# Patient Record
Sex: Female | Born: 1945 | Race: White | Hispanic: No | Marital: Married | Smoking: Former smoker
Health system: Southern US, Community
[De-identification: ages and names within clinical notes are randomized; demographics above are authoritative.]

## PROBLEM LIST (undated history)

## (undated) DIAGNOSIS — N6019 Diffuse cystic mastopathy of unspecified breast: Secondary | ICD-10-CM

## (undated) DIAGNOSIS — H3320 Serous retinal detachment, unspecified eye: Secondary | ICD-10-CM

## (undated) DIAGNOSIS — M1990 Unspecified osteoarthritis, unspecified site: Secondary | ICD-10-CM

## (undated) DIAGNOSIS — E538 Deficiency of other specified B group vitamins: Secondary | ICD-10-CM

## (undated) DIAGNOSIS — M797 Fibromyalgia: Secondary | ICD-10-CM

## (undated) DIAGNOSIS — Z8619 Personal history of other infectious and parasitic diseases: Secondary | ICD-10-CM

## (undated) DIAGNOSIS — J449 Chronic obstructive pulmonary disease, unspecified: Secondary | ICD-10-CM

## (undated) DIAGNOSIS — E785 Hyperlipidemia, unspecified: Secondary | ICD-10-CM

## (undated) DIAGNOSIS — G629 Polyneuropathy, unspecified: Secondary | ICD-10-CM

## (undated) DIAGNOSIS — M810 Age-related osteoporosis without current pathological fracture: Secondary | ICD-10-CM

## (undated) DIAGNOSIS — Z87891 Personal history of nicotine dependence: Secondary | ICD-10-CM

## (undated) DIAGNOSIS — Z8659 Personal history of other mental and behavioral disorders: Secondary | ICD-10-CM

## (undated) DIAGNOSIS — K589 Irritable bowel syndrome without diarrhea: Secondary | ICD-10-CM

## (undated) HISTORY — DX: Personal history of nicotine dependence: Z87.891

## (undated) HISTORY — DX: Personal history of other infectious and parasitic diseases: Z86.19

## (undated) HISTORY — DX: Irritable bowel syndrome, unspecified: K58.9

## (undated) HISTORY — DX: Deficiency of other specified B group vitamins: E53.8

## (undated) HISTORY — DX: Unspecified osteoarthritis, unspecified site: M19.90

## (undated) HISTORY — DX: Serous retinal detachment, unspecified eye: H33.20

## (undated) HISTORY — DX: Hyperlipidemia, unspecified: E78.5

## (undated) HISTORY — DX: Polyneuropathy, unspecified: G62.9

## (undated) HISTORY — DX: Diffuse cystic mastopathy of unspecified breast: N60.19

## (undated) HISTORY — DX: Fibromyalgia: M79.7

## (undated) HISTORY — DX: Personal history of other mental and behavioral disorders: Z86.59

## (undated) HISTORY — DX: Age-related osteoporosis without current pathological fracture: M81.0

---

## 1973-01-20 HISTORY — PX: PARTIAL HYSTERECTOMY: SHX80

## 2004-06-06 ENCOUNTER — Emergency Department (HOSPITAL_COMMUNITY): Admission: EM | Admit: 2004-06-06 | Discharge: 2004-06-07 | Payer: Self-pay | Admitting: Emergency Medicine

## 2005-12-10 ENCOUNTER — Encounter: Admission: RE | Admit: 2005-12-10 | Discharge: 2005-12-10 | Payer: Self-pay | Admitting: Gastroenterology

## 2005-12-20 HISTORY — PX: COLONOSCOPY: SHX174

## 2012-01-21 HISTORY — PX: RETINAL DETACHMENT SURGERY: SHX105

## 2012-01-21 HISTORY — PX: CATARACT EXTRACTION: SUR2

## 2013-01-20 DIAGNOSIS — M810 Age-related osteoporosis without current pathological fracture: Secondary | ICD-10-CM

## 2013-01-20 HISTORY — PX: OTHER SURGICAL HISTORY: SHX169

## 2013-01-20 HISTORY — DX: Age-related osteoporosis without current pathological fracture: M81.0

## 2013-02-07 LAB — HM DEXA SCAN

## 2013-02-08 ENCOUNTER — Ambulatory Visit: Payer: Self-pay | Admitting: Family Medicine

## 2013-02-08 LAB — HM MAMMOGRAPHY

## 2014-03-23 LAB — CBC
HGB: 14.5 g/dL
WBC: 7.9
platelet count: 195

## 2014-03-28 LAB — COMPLETE METABOLIC PANEL WITH GFR
ALT: 25
AST: 21 U/L
BILIRUBIN TOTAL: 0.6 mg/dL
Creat: 0.9
Glucose: 81
Potassium: 5 mmol/L

## 2014-11-14 ENCOUNTER — Ambulatory Visit (INDEPENDENT_AMBULATORY_CARE_PROVIDER_SITE_OTHER): Payer: Federal, State, Local not specified - PPO | Admitting: Family Medicine

## 2014-11-14 ENCOUNTER — Encounter: Payer: Self-pay | Admitting: Family Medicine

## 2014-11-14 ENCOUNTER — Encounter (INDEPENDENT_AMBULATORY_CARE_PROVIDER_SITE_OTHER): Payer: Self-pay

## 2014-11-14 VITALS — BP 128/84 | HR 88 | Temp 98.4°F | Ht 64.5 in | Wt 152.5 lb

## 2014-11-14 DIAGNOSIS — M545 Low back pain, unspecified: Secondary | ICD-10-CM | POA: Insufficient documentation

## 2014-11-14 DIAGNOSIS — E785 Hyperlipidemia, unspecified: Secondary | ICD-10-CM | POA: Diagnosis not present

## 2014-11-14 DIAGNOSIS — R208 Other disturbances of skin sensation: Secondary | ICD-10-CM | POA: Diagnosis not present

## 2014-11-14 DIAGNOSIS — G629 Polyneuropathy, unspecified: Secondary | ICD-10-CM | POA: Insufficient documentation

## 2014-11-14 DIAGNOSIS — K589 Irritable bowel syndrome without diarrhea: Secondary | ICD-10-CM

## 2014-11-14 MED ORDER — TRAMADOL HCL 50 MG PO TABS: 25.0000 mg | ORAL_TABLET | Freq: Three times a day (TID) | ORAL | Status: DC | PRN

## 2014-11-14 MED ORDER — SIMVASTATIN 40 MG PO TABS: 40.0000 mg | ORAL_TABLET | Freq: Every day | ORAL | Status: DC

## 2014-11-14 NOTE — Patient Instructions (Addendum)
We will request records of nerve tests and xrays recently.  Return as needed or in 2-3 months for medicare wellness visit Nice to meet you today, zocor (simvastatin) refilled today. Trial tramadol for back pain - start at 1/2 tablet.

## 2014-11-14 NOTE — Assessment & Plan Note (Signed)
Controlled with OTC remedies including probiotic

## 2014-11-14 NOTE — Assessment & Plan Note (Addendum)
Have requested records - will review when they arrive. Pt states had normal NCS previously. Continue gabapentin for now.

## 2014-11-14 NOTE — Assessment & Plan Note (Signed)
Chronic, stable. zocor refilled today.

## 2014-11-14 NOTE — Progress Notes (Signed)
BP 128/84 mmHg  Pulse 88  Temp(Src) 98.4 F (36.9 C) (Oral)  Ht 5' 4.5" (1.638 m)  Wt 152 lb 8 oz (69.174 kg)  BMI 25.78 kg/m2   CC: new pt to establish  Subjective:    Patient ID: Cassie Shaw, female    DOB: [birth year], 69 y.o.   MRN: [id number]  HPI: Cassie Shaw is a 69 y.o. female presenting on 11/14/2014 for Establish Care   Wife of Tabby Beaston. Prior saw Dr Lovie Macadamia at [facility].   Burning pain in feet - tested negative for peripheral neuropathy. Gabapentin not helpful. No redness/warmth. Never calf pain. Only affects sole of feet. No rash.   Chronic back pain - longstanding over last 2 yrs. 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 Takes aleve for this. Denies inciting trauma/injury. No shooting pain down legs, numbness or weakness of legs, bowel/bladder incontinence, fevers.   HLD - compliant with simvastatin, no myalgias.  Preventative: Last CPE unsure.  Lives with husband [name]), 2 dogs and a bird Occupation: retired, was Armed forces logistics/support/administrative officer Activity: no regular exercise Diet: good water, fruits/vegetables daily  Relevant past medical, surgical, family and social history reviewed and updated as indicated. Interim medical history since our last visit reviewed. Allergies and medications reviewed and updated. No current outpatient prescriptions on file prior to visit.   No current facility-administered medications on file prior to visit.    Review of Systems Per HPI unless specifically indicated in ROS section     Objective:    BP 128/84 mmHg  Pulse 88  Temp(Src) 98.4 F (36.9 C) (Oral)  Ht 5' 4.5" (1.638 m)  Wt 152 lb 8 oz (69.174 kg)  BMI 25.78 kg/m2  Wt Readings from Last 3 Encounters:  11/14/14 152 lb 8 oz (69.174 kg)    Physical Exam  Constitutional: She appears well-developed and well-nourished. No distress.  HENT:  Head: Normocephalic and atraumatic.  Mouth/Throat: Oropharynx is clear and moist. No oropharyngeal exudate.  Eyes: Conjunctivae and EOM  are normal. Pupils are equal, round, and reactive to light.  Neck: Normal range of motion. Neck supple.  Cardiovascular: Normal rate, regular rhythm, normal heart sounds and intact distal pulses.   No murmur heard. Pulmonary/Chest: Effort normal and breath sounds normal. No respiratory distress. She has no wheezes. She has no rales.  Musculoskeletal: She exhibits no edema.  No pain midline spine No paraspinous mm tenderness Neg SLR bilaterally. No pain with int/ext rotation at hip. Neg FABER. Feet neurovascularly intact, sensation to light touch and monofilament intact, 2+ DP bilaterally.   Neurological: She is alert.  Skin: Skin is warm and dry. No rash noted.  Psychiatric: She has a normal mood and affect.  Nursing note and vitals reviewed.  No results found for this or any previous visit.    Assessment & Plan:   Problem List Items Addressed This Visit    Lower back pain - Primary    Pt states told had arthritis - will review recent hip and lumbar xray obtained at [facility]. Records requested today. In interim, continue aleve, trial tramadol prn pain.       Relevant Medications   naproxen sodium (ANAPROX) 220 MG tablet   traMADol (ULTRAM) 50 MG tablet   IBS (irritable bowel syndrome)    Controlled with OTC remedies including probiotic      Relevant Medications   Probiotic Product (PROBIOTIC DAILY PO)   HLD (hyperlipidemia)    Chronic, stable. zocor refilled today.  Relevant Medications   simvastatin (ZOCOR) 40 MG tablet   Burning sensation of feet    Have requested records - will review when they arrive. Pt states had normal NCS previously. Continue gabapentin for now.          Follow up plan: Return in about 3 months (around 02/14/2015), or as needed, for medicare wellness visit.

## 2014-11-14 NOTE — Progress Notes (Signed)
Pre visit review using our clinic review tool, if applicable. No additional management support is needed unless otherwise documented below in the visit note. 

## 2014-11-14 NOTE — Assessment & Plan Note (Signed)
Pt states told had arthritis - will review recent hip and lumbar xray obtained at [facility]. Records requested today. In interim, continue aleve, trial tramadol prn pain.

## 2014-11-19 ENCOUNTER — Encounter: Payer: Self-pay | Admitting: Family Medicine

## 2014-11-19 DIAGNOSIS — N6019 Diffuse cystic mastopathy of unspecified breast: Secondary | ICD-10-CM | POA: Insufficient documentation

## 2014-11-19 DIAGNOSIS — M797 Fibromyalgia: Secondary | ICD-10-CM | POA: Insufficient documentation

## 2014-11-19 DIAGNOSIS — M810 Age-related osteoporosis without current pathological fracture: Secondary | ICD-10-CM | POA: Insufficient documentation

## 2014-11-19 DIAGNOSIS — E538 Deficiency of other specified B group vitamins: Secondary | ICD-10-CM | POA: Insufficient documentation

## 2014-11-24 ENCOUNTER — Encounter: Payer: Self-pay | Admitting: *Deleted

## 2014-12-02 ENCOUNTER — Encounter: Payer: Self-pay | Admitting: Family Medicine

## 2014-12-12 ENCOUNTER — Other Ambulatory Visit: Payer: Self-pay | Admitting: *Deleted

## 2014-12-12 MED ORDER — GABAPENTIN 600 MG PO TABS: 600.0000 mg | ORAL_TABLET | Freq: Two times a day (BID) | ORAL | Status: DC

## 2015-01-18 ENCOUNTER — Ambulatory Visit (INDEPENDENT_AMBULATORY_CARE_PROVIDER_SITE_OTHER): Payer: Federal, State, Local not specified - PPO | Admitting: Family Medicine

## 2015-01-18 ENCOUNTER — Encounter: Payer: Self-pay | Admitting: Family Medicine

## 2015-01-18 VITALS — BP 114/76 | HR 92 | Temp 98.7°F | Ht 64.5 in | Wt 151.5 lb

## 2015-01-18 DIAGNOSIS — J069 Acute upper respiratory infection, unspecified: Secondary | ICD-10-CM

## 2015-01-18 MED ORDER — DOXYCYCLINE HYCLATE 100 MG PO TABS: 100.0000 mg | ORAL_TABLET | Freq: Two times a day (BID) | ORAL | Status: DC

## 2015-01-18 NOTE — Progress Notes (Signed)
Dr. Frederico Hamman T. Bowe Sidor, MD, [facility] [place], [address] Phone: [phone] Fax: [phone]  01/18/2015  Patient: Cassie Shaw, MRN: [id number], DOB: Oct 28, 1945, 69 y.o.  Primary Physician:  Ria Bush, MD   Chief Complaint  Patient presents with  . Sinusitis    x 3 days  . Cough    with chest tightness   Subjective:   This 69 y.o. female patient presents with runny nose, sneezing, cough, sore throat, malaise and minimal / low-grade fever .  Pounding headache and will not loosen up.  Not feeling well since Monday   Quit smoking about 10 years. Smoked about 50 years.   + recent exposure to others with similar symptoms.   The patent denies sore throat as the primary complaint. Denies sthortness of breath/wheezing, high fever, chest pain, rhinits for more than 14 days, significant myalgia, otalgia, facial pain, abdominal pain, changes in bowel or bladder.  PMH, PHS, Allergies, Problem List, Medications, Family History, and Social History have all been reviewed.  Patient Active Problem List   Diagnosis Date Noted  . Osteoporosis   . Fibromyalgia   . Fibrocystic breast disease   . B12 deficiency   . Peripheral neuropathy ([place]) 11/14/2014  . IBS (irritable bowel syndrome)   . HLD (hyperlipidemia)   . Lower back pain     Past Medical History  Diagnosis Date  . History of chicken pox   . Arthritis   . History of depression     and anxiety  . HLD (hyperlipidemia)   . Ex-smoker   . Retinal detachment   . IBS (irritable bowel syndrome)   . Osteoporosis 2015    on/off fosamax DEXA T -3.5 spine, -2.5 hip  . Peripheral neuropathy ([place])   . Fibromyalgia   . Fibrocystic breast disease   . B12 deficiency     Past Surgical History  Procedure Laterality Date  . Partial hysterectomy  1975    for heavy bleeding, ovaries remained  . Cataract extraction Bilateral 2014    with lens implant  .  Retinal detachment surgery  2014    Dr Zadie Rhine  . Dexa  01/2013    T -3.5 spine, -2.5 hip    Social History   Social History  . Marital Status: Married    Spouse Name: N/A  . Number of Children: N/A  . Years of Education: N/A   Occupational History  . Not on file.   Social History Main Topics  . Smoking status: Former Smoker    Quit date: 01/21/2004  . Smokeless tobacco: Never Used  . Alcohol Use: No  . Drug Use: No  . Sexual Activity: Not on file   Other Topics Concern  . Not on file   Social History Narrative   Lives with husband [name]), 2 dogs and a bird   Occupation: retired, was Armed forces logistics/support/administrative officer   Activity: no regular exercise   Diet: good water, fruits/vegetables daily    Family History  Problem Relation Age of Onset  . Arthritis Mother   . Cancer Mother     smoker  . Cancer Sister     bone  . Stroke Neg Hx   . CAD Neg Hx   . Diabetes Maternal Uncle     No Known Allergies  Medication list reviewed and updated in full in [place].  ROS as above, eating and drinking - tolerating PO. Urinating normally. No excessive  vomitting or diarrhea. O/w as above.  Objective:   Blood pressure 114/76, pulse 92, temperature 98.7 F (37.1 C), temperature source Oral, height 5' 4.5" (1.638 m), weight 151 lb 8 oz (68.72 kg), SpO2 96 %.  GEN: WDWN, Non-toxic, Atraumatic, normocephalic. A and O x 3. HEENT: Oropharynx clear without exudate, MMM, no significant LAD, mild rhinnorhea Ears: TM clear, COL visualized with good landmarks CV: RRR, no m/g/r. Pulm: CTA B, no wheezes, rhonchi, or crackles, normal respiratory effort. EXT: no c/c/e Psych: well oriented, neither depressed nor anxious in appearance  Objective Data:  Assessment and Plan:   URI (upper respiratory infection)  Supportive care reviewed with patient. See patient instruction section.  Some sinus pain. Hold abx for now - but in 69 yo 50 pack year history, hold in case worse over new years.    Follow-up: No Follow-up on file.  New Prescriptions   DOXYCYCLINE (VIBRA-TABS) 100 MG TABLET    Take 1 tablet (100 mg total) by mouth 2 (two) times daily.   Modified Medications   No medications on file   No orders of the defined types were placed in this encounter.    Signed,  Maud Deed. Renley Banwart, MD   Patient's Medications  New Prescriptions   DOXYCYCLINE (VIBRA-TABS) 100 MG TABLET    Take 1 tablet (100 mg total) by mouth 2 (two) times daily.  Previous Medications   ALENDRONATE (FOSAMAX) 70 MG TABLET    Take 70 mg by mouth once a week. Take with a full glass of water on an empty stomach.   BIOTIN PO    Take 1 tablet by mouth daily.   CALCIUM CARB-CHOLECALCIFEROL (CALCIUM-VITAMIN D3) 600-400 MG-UNIT CAPS    Take 1 capsule by mouth daily.   GABAPENTIN (NEURONTIN) 600 MG TABLET    Take 1 tablet (600 mg total) by mouth 2 (two) times daily.   MULTIPLE VITAMIN (MULTIVITAMIN) TABLET    Take 1 tablet by mouth daily.   NAPROXEN SODIUM (ANAPROX) 220 MG TABLET    Take 220 mg by mouth 2 (two) times daily with a meal.   OMEGA-3 FATTY ACIDS (FISH OIL PO)    Take 1,000 mg by mouth daily.   PROBIOTIC PRODUCT (PROBIOTIC DAILY PO)    Take 1 capsule by mouth daily.   SIMVASTATIN (ZOCOR) 40 MG TABLET    Take 1 tablet (40 mg total) by mouth daily at 6 PM.   TRAMADOL (ULTRAM) 50 MG TABLET    Take 0.5-1 tablets (25-50 mg total) by mouth every 8 (eight) hours as needed.  Modified Medications   No medications on file  Discontinued Medications   No medications on file

## 2015-01-18 NOTE — Progress Notes (Signed)
Pre visit review using our clinic review tool, if applicable. No additional management support is needed unless otherwise documented below in the visit note. 

## 2015-09-07 ENCOUNTER — Encounter: Payer: Self-pay | Admitting: Family Medicine

## 2015-09-07 ENCOUNTER — Ambulatory Visit (INDEPENDENT_AMBULATORY_CARE_PROVIDER_SITE_OTHER): Payer: Federal, State, Local not specified - PPO | Admitting: Family Medicine

## 2015-09-07 VITALS — BP 122/82 | HR 80 | Temp 97.8°F | Wt 144.5 lb

## 2015-09-07 DIAGNOSIS — R110 Nausea: Secondary | ICD-10-CM | POA: Insufficient documentation

## 2015-09-07 DIAGNOSIS — G6289 Other specified polyneuropathies: Secondary | ICD-10-CM | POA: Diagnosis not present

## 2015-09-07 DIAGNOSIS — R112 Nausea with vomiting, unspecified: Secondary | ICD-10-CM | POA: Diagnosis not present

## 2015-09-07 LAB — COMPREHENSIVE METABOLIC PANEL
ALT: 24 U/L (ref 0–35)
AST: 26 U/L (ref 0–37)
Albumin: 4.6 g/dL (ref 3.5–5.2)
Alkaline Phosphatase: 24 U/L — ABNORMAL LOW (ref 39–117)
BILIRUBIN TOTAL: 0.5 mg/dL (ref 0.2–1.2)
BUN: 13 mg/dL (ref 6–23)
CHLORIDE: 104 meq/L (ref 96–112)
CO2: 30 meq/L (ref 19–32)
CREATININE: 0.85 mg/dL (ref 0.40–1.20)
Calcium: 9.8 mg/dL (ref 8.4–10.5)
GFR: 70.03 mL/min (ref >60.00)
GLUCOSE: 92 mg/dL (ref 70–99)
Potassium: 4.6 mEq/L (ref 3.5–5.1)
SODIUM: 141 meq/L (ref 135–145)
Total Protein: 7.8 g/dL (ref 6.0–8.3)

## 2015-09-07 LAB — CBC WITH DIFFERENTIAL/PLATELET
BASOS ABS: 0 10*3/uL (ref 0.0–0.1)
BASOS PCT: 0.4 % (ref 0.0–3.0)
EOS ABS: 0.1 10*3/uL (ref 0.0–0.7)
Eosinophils Relative: 1.3 % (ref 0.0–5.0)
HEMATOCRIT: 45 % (ref 36.0–46.0)
Hemoglobin: 15 g/dL (ref 12.0–15.0)
LYMPHS ABS: 2.4 10*3/uL (ref 0.7–4.0)
Lymphocytes Relative: 28.5 % (ref 12.0–46.0)
MCHC: 33.4 g/dL (ref 30.0–36.0)
MCV: 90.3 fl (ref 78.0–100.0)
Monocytes Absolute: 0.8 10*3/uL (ref 0.1–1.0)
Monocytes Relative: 9 % (ref 3.0–12.0)
NEUTROS ABS: 5.1 10*3/uL (ref 1.4–7.7)
NEUTROS PCT: 60.8 % (ref 43.0–77.0)
PLATELETS: 216 10*3/uL (ref 150.0–400.0)
RBC: 4.98 Mil/uL (ref 3.87–5.11)
RDW: 13.2 % (ref 11.5–15.5)
WBC: 8.4 10*3/uL (ref 4.0–10.5)

## 2015-09-07 LAB — FOLATE

## 2015-09-07 LAB — LIPASE: Lipase: 25 U/L (ref 11.0–59.0)

## 2015-09-07 LAB — TSH: TSH: 2 u[IU]/mL (ref 0.35–4.50)

## 2015-09-07 LAB — VITAMIN B12: Vitamin B-12: 533 pg/mL (ref 211–911)

## 2015-09-07 NOTE — Progress Notes (Signed)
BP 122/82   Pulse 80   Wt 144 lb 8 oz (65.5 kg)   BMI 24.42 kg/m   See chart for orthostatic vital signs CC: nausea, dizziness Subjective:    Patient ID: Cassie Shaw, female    DOB: [birth year], 70 y.o.   MRN: [id number]  HPI: Cassie Shaw is a 70 y.o. female presenting on 09/07/2015 for Dizziness (nausea)   1 wk h/o nausea/dizziness along with nausea. Vomited twice on Monday (NBNB). 7lb weight loss over last 8 months. She has been eating lots of sweets recently.   No new foods, recent travel.  Has been drinking ginger.  H/o IBS - may be more constipated. She treats this with probiotics, laxatives, etc.   Denies fevers/chills, abd pain, blood in stool, urinary symptoms like dysuria. No polyuria, polydipsia, polyphagia.   Longstanding neuropathy s/p NCS remotely - per patient normal. She treats this with gabapentin '600mg'$  BID and aleve BID. Ongoing trouble. Predominantly bothersome at night time.   Relevant past medical, surgical, family and social history reviewed and updated as indicated. Interim medical history since our last visit reviewed. Allergies and medications reviewed and updated. Current Outpatient Prescriptions on File Prior to Visit  Medication Sig  . alendronate (FOSAMAX) 70 MG tablet Take 70 mg by mouth once a week. Take with a full glass of water on an empty stomach.  Marland Kitchen BIOTIN PO Take 1 tablet by mouth daily.  . Calcium Carb-Cholecalciferol (CALCIUM-VITAMIN D3) 600-400 MG-UNIT CAPS Take 1 capsule by mouth daily.  Marland Kitchen gabapentin (NEURONTIN) 600 MG tablet Take 1 tablet (600 mg total) by mouth 2 (two) times daily.  . Multiple Vitamin (MULTIVITAMIN) tablet Take 1 tablet by mouth daily.  . naproxen sodium (ANAPROX) 220 MG tablet Take 220 mg by mouth 2 (two) times daily with a meal.  . Omega-3 Fatty Acids (FISH OIL PO) Take 1,000 mg by mouth daily.  . Probiotic Product (PROBIOTIC DAILY PO) Take 1 capsule by mouth daily.  . simvastatin (ZOCOR) 40 MG tablet Take 1 tablet  (40 mg total) by mouth daily at 6 PM.   No current facility-administered medications on file prior to visit.     Review of Systems Per HPI unless specifically indicated in ROS section     Objective:    BP 122/82   Pulse 80   Wt 144 lb 8 oz (65.5 kg)   BMI 24.42 kg/m   Wt Readings from Last 3 Encounters:  09/07/15 144 lb 8 oz (65.5 kg)  01/18/15 151 lb 8 oz (68.7 kg)  11/14/14 152 lb 8 oz (69.2 kg)    Physical Exam  Constitutional: She appears well-developed and well-nourished. No distress.  HENT:  Mouth/Throat: Oropharynx is clear and moist. No oropharyngeal exudate.  Cardiovascular: Normal rate, regular rhythm, normal heart sounds and intact distal pulses.   No murmur heard. Pulmonary/Chest: Effort normal and breath sounds normal. No respiratory distress. She has no wheezes. She has no rales.  Abdominal: Soft. Bowel sounds are normal. She exhibits no distension and no mass. There is no hepatosplenomegaly. There is no tenderness. There is no rigidity, no rebound, no guarding, no CVA tenderness and negative Murphy's sign.  Musculoskeletal: She exhibits no edema.  Skin: Skin is warm and dry. No rash noted.  Psychiatric: She has a normal mood and affect.  Nursing note and vitals reviewed.  Results for orders placed or performed in visit on 11/24/14  CBC  Result Value Ref Range   WBC 7.9  HGB 14.5 g/dL   platelet count 195   COMPLETE METABOLIC PANEL WITH GFR  Result Value Ref Range   Glucose 81    Potassium 5.0 mmol/L   Creat 0.9    AST 21 U/L   ALT 25    Total Bilirubin 0.6 mg/dL  Orthostatics positive.     Assessment & Plan:   Problem List Items Addressed This Visit    Nausea with vomiting - Primary    Anticipate viral gastritis. Discussed expected course of resolution. rec hold aleve until feeling better.  Supportive care with increased fluids. Check labs today (CMP, CBC, lipase).       Relevant Orders   Comprehensive metabolic panel   CBC with  Differential/Platelet   Lipase   Peripheral neuropathy ([place])    Ongoing trouble. Never received records. Check labs today (B12, TSH, folate, CBC, CMP). Continue gabapentin. Hold aleve until GI sxs have improved.       Relevant Orders   Comprehensive metabolic panel   CBC with Differential/Platelet   Vitamin B12   Folate   TSH    Other Visit Diagnoses   None.      Follow up plan: Return if symptoms worsen or fail to improve.  Ria Bush, MD

## 2015-09-07 NOTE — Assessment & Plan Note (Signed)
Ongoing trouble. Never received records. Check labs today (B12, TSH, folate, CBC, CMP). Continue gabapentin. Hold aleve until GI sxs have improved.

## 2015-09-07 NOTE — Patient Instructions (Addendum)
Vital signs show you may be a bit dehydrated. Continue drinking plenty of fluid.  Labs today.  We will be in touch with results.

## 2015-09-07 NOTE — Assessment & Plan Note (Signed)
Anticipate viral gastritis. Discussed expected course of resolution. rec hold aleve until feeling better.  Supportive care with increased fluids. Check labs today (CMP, CBC, lipase).

## 2015-09-07 NOTE — Progress Notes (Signed)
Pre visit review using our clinic review tool, if applicable. No additional management support is needed unless otherwise documented below in the visit note. 

## 2015-09-10 ENCOUNTER — Other Ambulatory Visit: Payer: Self-pay | Admitting: Family Medicine

## 2015-09-10 DIAGNOSIS — G6289 Other specified polyneuropathies: Secondary | ICD-10-CM

## 2015-09-12 NOTE — Addendum Note (Signed)
Addended by: Ria Bush on: 09/12/2015 09:08 AM   Modules accepted: Orders

## 2015-09-12 NOTE — Progress Notes (Signed)
Good Morning Dr [facility] would like you to put a Neurology Referral in and under comments put Nerve Conduction Study and then tell them what part of the body such as Bilateral Upper extremity, Left Upper extremity etc. Dr Posey Pronto would then review your notes to determine how much time is needed to do the test. Let me know if this is good with you. Thanks, Rosaria Ferries

## 2015-09-14 ENCOUNTER — Other Ambulatory Visit: Payer: Self-pay | Admitting: *Deleted

## 2015-09-14 DIAGNOSIS — G629 Polyneuropathy, unspecified: Secondary | ICD-10-CM

## 2015-09-18 ENCOUNTER — Ambulatory Visit (INDEPENDENT_AMBULATORY_CARE_PROVIDER_SITE_OTHER): Payer: Federal, State, Local not specified - PPO | Admitting: Neurology

## 2015-09-18 DIAGNOSIS — G629 Polyneuropathy, unspecified: Secondary | ICD-10-CM

## 2015-09-18 NOTE — Procedures (Signed)
[facility]  [address], [address]  [place], [place] [address] Tel: [phone] Fax:  [phone] Test Date:  09/18/2015  Patient: Cassie Shaw DOB: 1945-03-27 Physician: Narda Amber, DO  Sex: Female Height: '5\' 4"'$  Ref Phys: Philip Aspen, M.D.  ID#: [id number] Temp: 33.7C Technician: Jerilynn Mages. Dean   Patient Complaints: This is a 70 year old female referred for evaluation of bilateral burning pain over the soles of her feet.  NCV & EMG Findings: Extensive electrodiagnostic testing of the right lower extremity and additional studies of the left shows: 1. Bilateral sural and superficial peroneal sensory responses are within normal limits. 2. Bilateral peroneal and tibial motor responses are within normal limits. 3. Bilateral tibial H reflex studies are mildly prolonged, and of unclear clinical significance. 4. There is no evidence of active or chronic motor axon loss changes affecting any of the tested muscles. Motor unit configuration and recruitment pattern is within normal limits.  Impression: This is a normal study of the lower extremities.   In particular, there is no evidence of a large fiber sensorimotor polyneuropathy or lumbosacral radiculopathy. However, a small fiber neuropathy cannot be excluded by this study.   ___________________________ Narda Amber, DO    Nerve Conduction Studies Anti Sensory Summary Table   Stim Site NR Peak (ms) Norm Peak (ms) P-T Amp (V) Norm P-T Amp  Left Sup Peroneal Anti Sensory (Ant Lat Mall)  12 cm    3.5 <4.6 7.3 >3  Right Sup Peroneal Anti Sensory (Ant Lat Mall)  33.7C  12 cm    3.4 <4.6 7.1 >3  Left Sural Anti Sensory (Lat Mall)  Calf    4.4 <4.6 11.1 >3  Right Sural Anti Sensory (Lat Mall)  33.7C  Calf    4.3 <4.6 10.7 >3   Motor Summary Table   Stim Site NR Onset (ms) Norm Onset (ms) O-P Amp (mV) Norm O-P Amp Site1 Site2 Delta-0 (ms) Dist (cm) Vel (m/s) Norm Vel (m/s)  Left Peroneal Motor (Ext Dig Brev)  Ankle     3.9 <6.0 2.6 >2.5 B Fib Ankle 7.2 32.0 44 >40  B Fib    11.1  2.3  Poplt B Fib 2.3 10.0 43 >40  Poplt    13.4  2.4         Right Peroneal Motor (Ext Dig Brev)  33.7C  Ankle    3.9 <6.0 3.9 >2.5 B Fib Ankle 7.8 33.0 42 >40  B Fib    11.7  3.3  Poplt B Fib 2.2 10.0 45 >40  Poplt    13.9  3.2         Left Tibial Motor (Abd Hall Brev)  Ankle    4.1 <6.0 7.3 >4 Knee Ankle 8.7 37.0 43 >40  Knee    12.8  4.2         Right Tibial Motor (Abd Hall Brev)  33.7C  Ankle    3.8 <6.0 5.8 >4 Knee Ankle 9.6 40.0 42 >40  Knee    13.4  3.3          H Reflex Studies   NR H-Lat (ms) Lat Norm (ms) L-R H-Lat (ms)  Left Tibial (Gastroc)  33.7C     36.46 <35 0.95  Right Tibial (Gastroc)  33.7C     37.41 <35 0.95   EMG   Side Muscle Ins Act Fibs Psw Fasc Number Recrt Dur Dur. Amp Amp. Poly Poly. Comment  Right AntTibialis Nml Nml Nml Nml Nml Nml Nml  Nml Nml Nml Nml Nml N/A  Right Gastroc Nml Nml Nml Nml Nml Nml Nml Nml Nml Nml Nml Nml N/A  Right Flex Dig Long Nml Nml Nml Nml Nml Nml Nml Nml Nml Nml Nml Nml N/A  Right RectFemoris Nml Nml Nml Nml Nml Nml Nml Nml Nml Nml Nml Nml N/A  Right GluteusMed Nml Nml Nml Nml Nml Nml Nml Nml Nml Nml Nml Nml N/A  Right BicepsFemS Nml Nml Nml Nml Nml Nml Nml Nml Nml Nml Nml Nml N/A  Left AntTibialis Nml Nml Nml Nml Nml Nml Nml Nml Nml Nml Nml Nml N/A  Left Gastroc Nml Nml Nml Nml Nml Nml Nml Nml Nml Nml Nml Nml N/A  Left Flex Dig Long Nml Nml Nml Nml Nml Nml Nml Nml Nml Nml Nml Nml N/A  Left RectFemoris Nml Nml Nml Nml Nml Nml Nml Nml Nml Nml Nml Nml N/A      Waveforms:

## 2015-10-01 ENCOUNTER — Ambulatory Visit (INDEPENDENT_AMBULATORY_CARE_PROVIDER_SITE_OTHER): Payer: Federal, State, Local not specified - PPO | Admitting: Family Medicine

## 2015-10-01 DIAGNOSIS — Z23 Encounter for immunization: Secondary | ICD-10-CM

## 2015-10-01 NOTE — Progress Notes (Signed)
Spoke with patient re: normal NCS Offered options: 1. Monitor 2. Refer to Dr Posey Pronto 3. Refer to podiatry to consider biopsy.  Pt will monitor for now, reassess at next visit

## 2016-01-28 ENCOUNTER — Other Ambulatory Visit: Payer: Self-pay | Admitting: Family Medicine

## 2016-03-03 ENCOUNTER — Encounter: Payer: Self-pay | Admitting: Internal Medicine

## 2016-03-03 ENCOUNTER — Ambulatory Visit (INDEPENDENT_AMBULATORY_CARE_PROVIDER_SITE_OTHER): Payer: Federal, State, Local not specified - PPO | Admitting: Internal Medicine

## 2016-03-03 VITALS — BP 118/76 | HR 102 | Temp 100.1°F | Wt 143.5 lb

## 2016-03-03 DIAGNOSIS — J111 Influenza due to unidentified influenza virus with other respiratory manifestations: Secondary | ICD-10-CM | POA: Diagnosis not present

## 2016-03-03 MED ORDER — HYDROCODONE-HOMATROPINE 5-1.5 MG/5ML PO SYRP: 5.0000 mL | ORAL_SOLUTION | Freq: Three times a day (TID) | ORAL | 0 refills | Status: DC | PRN

## 2016-03-03 NOTE — Patient Instructions (Signed)

## 2016-03-03 NOTE — Progress Notes (Signed)
HPI  Pt presents to the clinic today with c/o runny nose, cough, chest congestion. This started 3-4 days ago. She is blowing clear mucous out of her nose. The cough is productive of clear/yellow mucous. She has run fevers up to 101, had chills, body aches and mild shortness of breath. She has tried Sudafed, Mucinex DM and Nasocort with minimal relief. She did get her flu shot. She has not had sick contacts that she was aware of.   Review of Systems        Past Medical History:  Diagnosis Date  . Arthritis   . B12 deficiency   . Ex-smoker   . Fibrocystic breast disease   . Fibromyalgia   . History of chicken pox   . History of depression    and anxiety  . HLD (hyperlipidemia)   . IBS (irritable bowel syndrome)   . Osteoporosis 2015   on/off fosamax DEXA T -3.5 spine, -2.5 hip  . Peripheral neuropathy ([place])   . Retinal detachment     Family History  Problem Relation Age of Onset  . Arthritis Mother   . Cancer Mother     smoker  . Cancer Sister     bone  . Stroke Neg Hx   . CAD Neg Hx   . Diabetes Maternal Uncle     Social History   Social History  . Marital status: Married    Spouse name: N/A  . Number of children: N/A  . Years of education: N/A   Occupational History  . Not on file.   Social History Main Topics  . Smoking status: Former Smoker    Quit date: 01/21/2004  . Smokeless tobacco: Never Used  . Alcohol use No  . Drug use: No  . Sexual activity: Not on file   Other Topics Concern  . Not on file   Social History Narrative   Lives with husband [name]), 2 dogs and a bird   Occupation: retired, was Armed forces logistics/support/administrative officer   Activity: no regular exercise   Diet: good water, fruits/vegetables daily    No Known Allergies   Constitutional: Positive headache, fatigue and fever. Denies abrupt weight changes.  HEENT:  Positive runny nose. Denies eye redness, eye pain, pressure behind the eyes, facial pain, nasal congestion, ear pain, ringing in the ears, wax  buildup, or sore throat. Respiratory: Positive cough and shortness of breath. Denies difficulty breathing.  Cardiovascular: Denies chest pain, chest tightness, palpitations or swelling in the hands or feet.   No other specific complaints in a complete review of systems (except as listed in HPI above).  Objective:   BP 118/76   Pulse (!) 102   Temp 100.1 F (37.8 C) (Oral)   Wt 143 lb 8 oz (65.1 kg)   SpO2 96%   BMI 24.25 kg/m  Wt Readings from Last 3 Encounters:  03/03/16 143 lb 8 oz (65.1 kg)  09/07/15 144 lb 8 oz (65.5 kg)  01/18/15 151 lb 8 oz (68.7 kg)     General: Appears her stated age, ill appearing in NAD. HEENT: Head: normal shape and size, no sinus tenderness noted; Ears: Tm's pink but intact, normal light reflex, + serous effusion bilaterally; Nose: mucosa boggy and moist, septum midline; Throat/Mouth: + PND. Teeth present, mucosa pink and moist, no exudate noted, no lesions or ulcerations noted.  Neck: No cervical lymphadenopathy.  Cardiovascular: Tachycardic with normal rhythm. S1,S2 noted.  No murmur, rubs or gallops noted.  Pulmonary/Chest: Normal effort and positive  vesicular breath sounds. No respiratory distress. No wheezes, rales or ronchi noted.       Assessment & Plan:   Influenza:  Too late for Tamiflu Get some rest and drink plenty of water Tylenol or Ibuprofen for fever and body aches Continue Nasocort and Mucinex Rx for Hycodan cough syrup  RTC as needed or if symptoms persist.   Webb Silversmith, NP

## 2016-03-13 ENCOUNTER — Ambulatory Visit (INDEPENDENT_AMBULATORY_CARE_PROVIDER_SITE_OTHER): Payer: Federal, State, Local not specified - PPO | Admitting: Family Medicine

## 2016-03-13 ENCOUNTER — Encounter: Payer: Self-pay | Admitting: Family Medicine

## 2016-03-13 VITALS — BP 124/84 | HR 82 | Temp 98.4°F | Wt 139.0 lb

## 2016-03-13 DIAGNOSIS — M810 Age-related osteoporosis without current pathological fracture: Secondary | ICD-10-CM

## 2016-03-13 DIAGNOSIS — J019 Acute sinusitis, unspecified: Secondary | ICD-10-CM

## 2016-03-13 DIAGNOSIS — J0100 Acute maxillary sinusitis, unspecified: Secondary | ICD-10-CM | POA: Insufficient documentation

## 2016-03-13 DIAGNOSIS — G6289 Other specified polyneuropathies: Secondary | ICD-10-CM | POA: Diagnosis not present

## 2016-03-13 DIAGNOSIS — R42 Dizziness and giddiness: Secondary | ICD-10-CM | POA: Insufficient documentation

## 2016-03-13 DIAGNOSIS — E785 Hyperlipidemia, unspecified: Secondary | ICD-10-CM

## 2016-03-13 LAB — LIPID PANEL
CHOL/HDL RATIO: 7
Cholesterol: 269 mg/dL — ABNORMAL HIGH (ref 0–200)
HDL: 41.4 mg/dL (ref >39.00)
NonHDL: 227.78
Triglycerides: 324 mg/dL — ABNORMAL HIGH (ref 0.0–149.0)
VLDL: 64.8 mg/dL — AB (ref 0.0–40.0)

## 2016-03-13 LAB — VITAMIN D 25 HYDROXY (VIT D DEFICIENCY, FRACTURES): VITD: 27.46 ng/mL — ABNORMAL LOW (ref 30.00–100.00)

## 2016-03-13 LAB — LDL CHOLESTEROL, DIRECT: LDL DIRECT: 161 mg/dL

## 2016-03-13 MED ORDER — AMOXICILLIN-POT CLAVULANATE 875-125 MG PO TABS: 1.0000 | ORAL_TABLET | Freq: Two times a day (BID) | ORAL | 0 refills | Status: AC

## 2016-03-13 NOTE — Assessment & Plan Note (Signed)
Pt self stopped zocor, fish oil. Worried worsening burning foot pain from this. Will update FLP off statin

## 2016-03-13 NOTE — Assessment & Plan Note (Signed)
Anticipate acute bacterial sinus infection given duration and progression of symptoms, after initial influenza illness. Treat with augmentin antibiotic. Push fluids and rest. Further supportive care as per instructions.

## 2016-03-13 NOTE — Patient Instructions (Signed)
Labs today I do recommend you restart weekly fosamax.  I think you had vestibular neuritis after infection leading to vertigo - this should be better.  I think you have sinusitis - treat with augmentin for 10 days. Push fluids and rest. May take ibuprofen as needed.

## 2016-03-13 NOTE — Assessment & Plan Note (Signed)
Sudden episode of vertigo 2d ago that lasted 1 hour, in setting of recent influenza and now concern for bacterial sinusitis. Anticipate vestibular neuritis. Reassured. Update if recurrent.

## 2016-03-13 NOTE — Assessment & Plan Note (Signed)
Off fosamax - worried this was causing worsening neuropathy. rec restart this. Check vit D level today.

## 2016-03-13 NOTE — Assessment & Plan Note (Signed)
Improved when she stopped other medications. Continues gabapentin '600mg'$  bid.

## 2016-03-13 NOTE — Progress Notes (Signed)
BP 124/84   Pulse 82   Temp 98.4 F (36.9 C) (Oral)   Wt 139 lb (63 kg)   SpO2 96%   BMI 23.49 kg/m    CC: URI Subjective:    Patient ID: Cassie Shaw, female    DOB: [birth year], 71 y.o.   MRN: [id number]  HPI: Cassie Shaw is a 71 y.o. female presenting on 03/13/2016 for URI (congestion,cough,HA,sinus pressure)   Sick for 3 weeks. Some bowel/bladder incontinence due to cough. 2d ago episode of vertigo while in bed. This lasted ~1 hour. Wanted to go to ER but didn't go. +nausea. No hearing changes. Vertigo is now better. Ongoing productive cough. Some dyspnea and PNdrainage and HA. Ongoing sinus pressure - with ongoing head and chest congestion.   Denies further fevers/chills, wheezing, ear or tooth pain, abd pain, ST.   Seen here 2/12 with dx influenza. Not prescribed tamiflu as too late. Flu swab not checked. Treated with hycodan which also helped.   Stopped all meds except for gabapentin - though meds were causing burning in feet. Requests FLP checked today as fasting.   Relevant past medical, surgical, family and social history reviewed and updated as indicated. Interim medical history since our last visit reviewed. Allergies and medications reviewed and updated. Outpatient Medications Prior to Visit  Medication Sig Dispense Refill  . gabapentin (NEURONTIN) 600 MG tablet TAKE 1 TABLET (600 MG TOTAL) BY MOUTH 2 (TWO) TIMES DAILY. 180 tablet 0  . alendronate (FOSAMAX) 70 MG tablet Take 70 mg by mouth once a week. Take with a full glass of water on an empty stomach.    Marland Kitchen BIOTIN PO Take 1 tablet by mouth daily.    . Calcium Carb-Cholecalciferol (CALCIUM-VITAMIN D3) 600-400 MG-UNIT CAPS Take 1 capsule by mouth daily.    Marland Kitchen HYDROcodone-homatropine (HYCODAN) 5-1.5 MG/5ML syrup Take 5 mLs by mouth every 8 (eight) hours as needed for cough. (Patient not taking: Reported on 03/13/2016) 75 mL 0  . Multiple Vitamin (MULTIVITAMIN) tablet Take 1 tablet by mouth daily.    . naproxen sodium  (ANAPROX) 220 MG tablet Take 220 mg by mouth 2 (two) times daily with a meal.    . Omega-3 Fatty Acids (FISH OIL PO) Take 1,000 mg by mouth daily.    . Probiotic Product (PROBIOTIC DAILY PO) Take 1 capsule by mouth daily.    . simvastatin (ZOCOR) 40 MG tablet Take 1 tablet (40 mg total) by mouth daily at 6 PM. (Patient not taking: Reported on 03/03/2016) 90 tablet 3   No facility-administered medications prior to visit.      Per HPI unless specifically indicated in ROS section below Review of Systems     Objective:    BP 124/84   Pulse 82   Temp 98.4 F (36.9 C) (Oral)   Wt 139 lb (63 kg)   SpO2 96%   BMI 23.49 kg/m   Wt Readings from Last 3 Encounters:  03/13/16 139 lb (63 kg)  03/03/16 143 lb 8 oz (65.1 kg)  09/07/15 144 lb 8 oz (65.5 kg)    Physical Exam  Constitutional: She appears well-developed and well-nourished. No distress.  HENT:  Head: Normocephalic and atraumatic.  Right Ear: Hearing, tympanic membrane, external ear and ear canal normal.  Left Ear: Hearing, tympanic membrane, external ear and ear canal normal.  Nose: No mucosal edema or rhinorrhea. Right sinus exhibits no maxillary sinus tenderness and no frontal sinus tenderness. Left sinus exhibits no maxillary sinus tenderness and  no frontal sinus tenderness.  Mouth/Throat: Uvula is midline, oropharynx is clear and moist and mucous membranes are normal. No oropharyngeal exudate, posterior oropharyngeal edema, posterior oropharyngeal erythema or tonsillar abscesses.  Mild nasal mucosal injection  Eyes: Conjunctivae and EOM are normal. Pupils are equal, round, and reactive to light. No scleral icterus.  Neck: Normal range of motion. Neck supple.  Cardiovascular: Normal rate, regular rhythm, normal heart sounds and intact distal pulses.   No murmur heard. Pulmonary/Chest: Effort normal and breath sounds normal. No respiratory distress. She has no wheezes. She has no rales.  Musculoskeletal: She exhibits no edema.    Lymphadenopathy:    She has no cervical adenopathy.  Skin: Skin is warm and dry. No rash noted.  Nursing note and vitals reviewed.  Results for orders placed or performed in visit on 09/07/15  Comprehensive metabolic panel  Result Value Ref Range   Sodium 141 135 - 145 mEq/L   Potassium 4.6 3.5 - 5.1 mEq/L   Chloride 104 96 - 112 mEq/L   CO2 30 19 - 32 mEq/L   Glucose, Bld 92 70 - 99 mg/dL   BUN 13 6 - 23 mg/dL   Creatinine, Ser 0.85 0.40 - 1.20 mg/dL   Total Bilirubin 0.5 0.2 - 1.2 mg/dL   Alkaline Phosphatase 24 (L) 39 - 117 U/L   AST 26 0 - 37 U/L   ALT 24 0 - 35 U/L   Total Protein 7.8 6.0 - 8.3 g/dL   Albumin 4.6 3.5 - 5.2 g/dL   Calcium 9.8 8.4 - 10.5 mg/dL   GFR 70.03 >60.00 mL/min  CBC with Differential/Platelet  Result Value Ref Range   WBC 8.4 4.0 - 10.5 K/uL   RBC 4.98 3.87 - 5.11 Mil/uL   Hemoglobin 15.0 12.0 - 15.0 g/dL   HCT 45.0 36.0 - 46.0 %   MCV 90.3 78.0 - 100.0 fl   MCHC 33.4 30.0 - 36.0 g/dL   RDW 13.2 11.5 - 15.5 %   Platelets 216.0 150.0 - 400.0 K/uL   Neutrophils Relative % 60.8 43.0 - 77.0 %   Lymphocytes Relative 28.5 12.0 - 46.0 %   Monocytes Relative 9.0 3.0 - 12.0 %   Eosinophils Relative 1.3 0.0 - 5.0 %   Basophils Relative 0.4 0.0 - 3.0 %   Neutro Abs 5.1 1.4 - 7.7 K/uL   Lymphs Abs 2.4 0.7 - 4.0 K/uL   Monocytes Absolute 0.8 0.1 - 1.0 K/uL   Eosinophils Absolute 0.1 0.0 - 0.7 K/uL   Basophils Absolute 0.0 0.0 - 0.1 K/uL  Lipase  Result Value Ref Range   Lipase 25.0 11.0 - 59.0 U/L  Vitamin B12  Result Value Ref Range   Vitamin B-12 533 211 - 911 pg/mL  Folate  Result Value Ref Range   Folate >23.7 >5.9 ng/mL  TSH  Result Value Ref Range   TSH 2.00 0.35 - 4.50 uIU/mL  No results found for: CHOL, HDL, LDLCALC, LDLDIRECT, TRIG, CHOLHDL     Assessment & Plan:   Problem List Items Addressed This Visit    Acute sinusitis - Primary    Anticipate acute bacterial sinus infection given duration and progression of symptoms, after  initial influenza illness. Treat with augmentin antibiotic. Push fluids and rest. Further supportive care as per instructions.       Relevant Medications   amoxicillin-clavulanate (AUGMENTIN) 875-125 MG tablet   HLD (hyperlipidemia)    Pt self stopped zocor, fish oil. Worried worsening burning foot pain from  this. Will update FLP off statin      Relevant Orders   Lipid panel   Osteoporosis    Off fosamax - worried this was causing worsening neuropathy. rec restart this. Check vit D level today.       Relevant Orders   VITAMIN D 25 Hydroxy (Vit-D Deficiency, Fractures)   Peripheral neuropathy ([place])    Improved when she stopped other medications. Continues gabapentin '600mg'$  bid.       Vertigo    Sudden episode of vertigo 2d ago that lasted 1 hour, in setting of recent influenza and now concern for bacterial sinusitis. Anticipate vestibular neuritis. Reassured. Update if recurrent.           Follow up plan: No Follow-up on file.  Ria Bush, MD

## 2016-03-13 NOTE — Progress Notes (Signed)
Pre visit review using our clinic review tool, if applicable. No additional management support is needed unless otherwise documented below in the visit note. 

## 2016-03-15 ENCOUNTER — Other Ambulatory Visit: Payer: Self-pay | Admitting: Family Medicine

## 2016-03-15 MED ORDER — VITAMIN D3 25 MCG (1000 UT) PO CAPS: 1.0000 | ORAL_CAPSULE | Freq: Every day | ORAL | Status: AC

## 2016-03-15 MED ORDER — ATORVASTATIN CALCIUM 40 MG PO TABS: 40.0000 mg | ORAL_TABLET | Freq: Every day | ORAL | 9 refills | Status: DC

## 2016-04-17 ENCOUNTER — Ambulatory Visit (INDEPENDENT_AMBULATORY_CARE_PROVIDER_SITE_OTHER): Payer: Federal, State, Local not specified - PPO | Admitting: Podiatry

## 2016-04-17 ENCOUNTER — Ambulatory Visit (INDEPENDENT_AMBULATORY_CARE_PROVIDER_SITE_OTHER): Payer: Federal, State, Local not specified - PPO

## 2016-04-17 ENCOUNTER — Encounter: Payer: Self-pay | Admitting: Podiatry

## 2016-04-17 VITALS — Resp 16 | Ht 65.0 in | Wt 142.0 lb

## 2016-04-17 DIAGNOSIS — G629 Polyneuropathy, unspecified: Secondary | ICD-10-CM

## 2016-04-17 DIAGNOSIS — M79671 Pain in right foot: Secondary | ICD-10-CM

## 2016-04-17 DIAGNOSIS — M79672 Pain in left foot: Principal | ICD-10-CM

## 2016-04-17 NOTE — Progress Notes (Signed)
   Subjective:    Patient ID: Cassie Shaw, female    DOB: 09-Dec-1945, 71 y.o.   MRN: [id number]  HPI  Chief Complaint  Patient presents with  . Foot Pain    BL; Bottom of feet. Pt stated "been having burning on the entire bottom of my feet for the past 10 years, has had neuopathy tests done in the past but has never been diagnosed"        Review of Systems     Objective:   Physical Exam        Assessment & Plan:

## 2016-04-18 ENCOUNTER — Telehealth: Payer: Self-pay | Admitting: *Deleted

## 2016-04-18 MED ORDER — NONFORMULARY OR COMPOUNDED ITEM: 2 refills | Status: DC

## 2016-04-18 NOTE — Telephone Encounter (Signed)
Dr. Paulla Dolly ordered [place] - Peripheral Neuropathy cream. Faxed to [facility].

## 2016-04-18 NOTE — Progress Notes (Signed)
Subjective:     Patient ID: Cassie Shaw, female   DOB: 1945/06/20, 71 y.o.   MRN: [id number]  HPI patient presents stating I get a lot of burning in both my feet and it's been going on a number of years and I'm on gabapentin which only seems to help me temporarily   Review of Systems  All other systems reviewed and are negative.      Objective:   Physical Exam  Constitutional: She is oriented to person, place, and time.  Cardiovascular: Intact distal pulses.   Musculoskeletal: Normal range of motion.  Neurological: She is oriented to person, place, and time.  Skin: Skin is warm and dry.  Nursing note and vitals reviewed.  neurovascular status was found to be intact with significant diminishment of sharp dull and vibratory sensation bilateral. 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 At this point I do think she shouldn't increase her gabapentin at night to try to see whether or not this will help with the burning pain she develops and utilized soaks not going barefoot and cushion. She'll be seen back as needed  X-rays did not indicate arthritis or advanced osteoporosis

## 2016-05-13 ENCOUNTER — Other Ambulatory Visit: Payer: Self-pay | Admitting: Family Medicine

## 2016-06-10 ENCOUNTER — Other Ambulatory Visit: Payer: Self-pay | Admitting: Family Medicine

## 2016-10-04 ENCOUNTER — Other Ambulatory Visit: Payer: Self-pay | Admitting: Family Medicine

## 2016-10-06 NOTE — Telephone Encounter (Signed)
Pt see podiatry for neuropathy. Has only been seen here for acute illness visits since 2016.

## 2016-10-06 NOTE — Telephone Encounter (Signed)
Will refill 1 63mo supply and ask her to come in for CPE or f/u visit

## 2016-10-14 NOTE — Telephone Encounter (Signed)
Spoke with pt, relayed message per Dr. Darnell Level. Says she will have to cb to schedule an appt.

## 2016-10-20 ENCOUNTER — Telehealth: Payer: Self-pay | Admitting: Family Medicine

## 2016-10-20 NOTE — Telephone Encounter (Signed)
Ok to do - just remind her it may have higher chance of side effects.

## 2016-10-20 NOTE — Telephone Encounter (Signed)
Caller Name:gordon Laube  Relationship to Patient:spouse Best [place]:  Reason for call: spouse is wanting wife to have high dose flu shot.  Is this ok?

## 2016-10-22 NOTE — Telephone Encounter (Signed)
Spoke with pt relaying message per Dr. Darnell Level. Says ok. And she will inform Mr. [name].

## 2016-11-03 ENCOUNTER — Encounter: Payer: Self-pay | Admitting: Family Medicine

## 2016-11-03 ENCOUNTER — Ambulatory Visit (INDEPENDENT_AMBULATORY_CARE_PROVIDER_SITE_OTHER): Payer: Federal, State, Local not specified - PPO | Admitting: Family Medicine

## 2016-11-03 VITALS — BP 118/84 | HR 90 | Temp 98.0°F | Wt 147.5 lb

## 2016-11-03 DIAGNOSIS — N3001 Acute cystitis with hematuria: Secondary | ICD-10-CM

## 2016-11-03 DIAGNOSIS — N390 Urinary tract infection, site not specified: Secondary | ICD-10-CM | POA: Insufficient documentation

## 2016-11-03 DIAGNOSIS — R350 Frequency of micturition: Secondary | ICD-10-CM | POA: Diagnosis not present

## 2016-11-03 LAB — POC URINALSYSI DIPSTICK (AUTOMATED)
BILIRUBIN UA: NEGATIVE
GLUCOSE UA: NEGATIVE
KETONES UA: NEGATIVE
Nitrite, UA: NEGATIVE
Protein, UA: NEGATIVE
SPEC GRAV UA: 1.015 (ref 1.010–1.025)
UROBILINOGEN UA: 0.2 U/dL
pH, UA: 6 (ref 5.0–8.0)

## 2016-11-03 MED ORDER — CEPHALEXIN 500 MG PO CAPS: 500.0000 mg | ORAL_CAPSULE | Freq: Two times a day (BID) | ORAL | 0 refills | Status: DC

## 2016-11-03 NOTE — Assessment & Plan Note (Addendum)
Anticipate acute UTI given UA and story. She does not have h/o recurrent UTIs - UCx not sent. Treat with 1 wk keflex course. Given 3+ blood on UA, I did ask patient to return in 2-3 wks to drop off rpt UA to ensure hematuria has cleared. Pt agrees with plan.

## 2016-11-03 NOTE — Patient Instructions (Addendum)
I do think you have a urine infection. Treat with 1 week course of keflex antibiotic twice daily. Push fluids and plenty of rest. May use cranberry juice to help prevent infection.  Let us know if fever >101 or not improving with treatment. Given microscopic blood in urine, I do recommend you return in 2-3 weeks for repeat urine test - may drop off specimen.

## 2016-11-03 NOTE — Progress Notes (Signed)
BP 118/84 (BP Location: Left Arm, Patient Position: Sitting, Cuff Size: Normal)   Pulse 90   Temp 98 F (36.7 C) (Oral)   Wt 147 lb 8 oz (66.9 kg)   SpO2 95%   BMI 24.55 kg/m    CC: UTI sxs Subjective:    Patient ID: Cassie Shaw, female    DOB: [birth year], 71 y.o.   MRN: [id number]  HPI: Cassie Shaw is a 71 y.o. female presenting on 11/03/2016 for Urinary Frequency (Started 10/27/16. Has tried Uristat, barely helpful) and Urinary Urgency   1+ wk h/o urinary urgency and frequency and suprapubic pressure, as well as incomplete emptying. uristat helped but didn't resolve symptoms. Some urethral pain/pressure with voiding. Some R flank pain. Some nausea. She has been drinking cranberry juice.   No fevers/chills, vomiting, hematuria.   Last UTI was years ago - not frequent.  No recent abx use.  Staying well hydrated.   Relevant past medical, surgical, family and social history reviewed and updated as indicated. Interim medical history since our last visit reviewed. Allergies and medications reviewed and updated. Outpatient Medications Prior to Visit  Medication Sig Dispense Refill  . alendronate (FOSAMAX) 70 MG tablet TAKE 1 TABLET (70 MG TOTAL) BY MOUTH ONCE A WEEK. 12 tablet 1  . atorvastatin (LIPITOR) 40 MG tablet Take 1 tablet (40 mg total) by mouth daily. 30 tablet 9  . Calcium Carb-Cholecalciferol (CALCIUM-VITAMIN D3) 600-400 MG-UNIT CAPS Take 1 capsule by mouth daily.    . Cholecalciferol (VITAMIN D3) 1000 units CAPS Take 1 capsule (1,000 Units total) by mouth daily. 30 capsule   . gabapentin (NEURONTIN) 600 MG tablet TAKE 1 TABLET (600 MG TOTAL) BY MOUTH 2 (TWO) TIMES DAILY. 180 tablet 0  . Multiple Vitamin (MULTIVITAMIN) tablet Take 1 tablet by mouth daily.    . naproxen sodium (ANAPROX) 220 MG tablet Take 220 mg by mouth 2 (two) times daily with a meal.    . NON FORMULARY [facility]  Peripheral Neuropathy Cream- Bupivacaine 1%, Doxepin 3%, Gabapentin 6%,  Pentoxifylline 3%, Topiramate 1% Apply 1-2 grams to affected area 3-4 times daily Qty. 120 gm 3 refills    . NONFORMULARY OR COMPOUNDED ITEM [facility]: Peripheral Neuropathy cream - Bupivacaine 1%, Doxepin 3%, Gabapentin 6%, Pentoxifylline 3%, Topiramate 1%, apply 1-2 grams to affected area 3-4 times daily. 120 each 2  . simvastatin (ZOCOR) 40 MG tablet Take 1 tablet (40 mg total) by mouth daily at 6 PM. 90 tablet 3   No facility-administered medications prior to visit.      Per HPI unless specifically indicated in ROS section below Review of Systems     Objective:    BP 118/84 (BP Location: Left Arm, Patient Position: Sitting, Cuff Size: Normal)   Pulse 90   Temp 98 F (36.7 C) (Oral)   Wt 147 lb 8 oz (66.9 kg)   SpO2 95%   BMI 24.55 kg/m   Wt Readings from Last 3 Encounters:  11/03/16 147 lb 8 oz (66.9 kg)  04/17/16 142 lb (64.4 kg)  03/13/16 139 lb (63 kg)    Physical Exam  Constitutional: She appears well-developed and well-nourished. No distress.  Abdominal: Soft. Normal appearance and bowel sounds are normal. She exhibits no distension and no mass. There is no hepatosplenomegaly. There is tenderness (mild discomfort) in the suprapubic area. There is CVA tenderness (mild R sided). There is no rigidity, no rebound, no guarding and negative Murphy's sign.  Musculoskeletal: She exhibits no edema.  Nursing note and vitals reviewed.  Results for orders placed or performed in visit on 11/03/16  POCT Urinalysis Dipstick (Automated)  Result Value Ref Range   Color, UA gold    Clarity, UA clear    Glucose, UA negative    Bilirubin, UA negative    Ketones, UA negative    Spec Grav, UA 1.015 1.010 - 1.025   Blood, UA 3+    pH, UA 6.0 5.0 - 8.0   Protein, UA negative    Urobilinogen, UA 0.2 0.2 or 1.0 E.U./dL   Nitrite, UA negative    Leukocytes, UA Moderate (2+) (A) Negative      Assessment & Plan:   Problem List Items Addressed This Visit    UTI (urinary  tract infection) - Primary    Anticipate acute UTI given UA and story. She does not have h/o recurrent UTIs - UCx not sent. Treat with 1 wk keflex course. Given 3+ blood on UA, I did ask patient to return in 2-3 wks to drop off rpt UA to ensure hematuria has cleared. Pt agrees with plan.       Relevant Medications   cephALEXin (KEFLEX) 500 MG capsule    Other Visit Diagnoses    Urine frequency       Relevant Orders   POCT Urinalysis Dipstick (Automated) (Completed)       Follow up plan: Return if symptoms worsen or fail to improve.  Ria Bush, MD

## 2016-11-14 ENCOUNTER — Other Ambulatory Visit (INDEPENDENT_AMBULATORY_CARE_PROVIDER_SITE_OTHER): Payer: Federal, State, Local not specified - PPO

## 2016-11-14 DIAGNOSIS — R8290 Unspecified abnormal findings in urine: Secondary | ICD-10-CM

## 2016-11-14 LAB — POC URINALSYSI DIPSTICK (AUTOMATED)
Bilirubin, UA: NEGATIVE
GLUCOSE UA: NEGATIVE
KETONES UA: NEGATIVE
Leukocytes, UA: NEGATIVE
Protein, UA: NEGATIVE
RBC UA: NEGATIVE
SPEC GRAV UA: 1.02 (ref 1.010–1.025)
UROBILINOGEN UA: NEGATIVE U/dL — AB
pH, UA: 6 (ref 5.0–8.0)

## 2016-11-17 ENCOUNTER — Telehealth: Payer: Self-pay

## 2016-11-17 ENCOUNTER — Other Ambulatory Visit: Payer: Federal, State, Local not specified - PPO

## 2016-11-17 ENCOUNTER — Telehealth: Payer: Self-pay | Admitting: Family Medicine

## 2016-11-17 DIAGNOSIS — N3001 Acute cystitis with hematuria: Secondary | ICD-10-CM

## 2016-11-17 MED ORDER — CIPROFLOXACIN HCL 500 MG PO TABS: 500.0000 mg | ORAL_TABLET | Freq: Two times a day (BID) | ORAL | 0 refills | Status: DC

## 2016-11-17 NOTE — Telephone Encounter (Signed)
Copied from [place] [id number]. Topic: Complaint - Care >> Nov 17, 2016 12:06 PM Neva Seat wrote: Reason for CRM:   Pt's husband call about wife.  Wife has had a UTI and was given antibiotics that has not cured the UTI.  Pt left a specimen and has not recievied a call back.  Husband is very upset that his wife which is the patient has not had a call back.  Refused to talk to the Nurse Triage.  He asked to speak with Practice Administrator - Ali Lowe.  I gave him the number [phone].

## 2016-11-17 NOTE — Telephone Encounter (Signed)
PLEASE NOTE: All timestamps contained within this report are represented as [place] Standard Time. CONFIDENTIALTY NOTICE: This fax transmission is intended only for the addressee. It contains information that is legally privileged, confidential or otherwise protected from use or disclosure. If you are not the intended recipient, you are strictly prohibited from reviewing, disclosing, copying using or disseminating any of this information or taking any action in reliance on or regarding this information. If you have received this fax in error, please notify us immediately by telephone so that we can arrange for its return to [place]. Phone: [phone], Toll-Free: [phone], Fax: [phone] Page: 1 of 2 Call Id: [id number] [place] Patient Name: Cassie Shaw Gender: Female DOB: [birth year] Age: 71 Y 60 M 24 D Return Phone Number: [phone] (Primary) Address: City/State/Zip: [place] [address] Client [facility] Night - Client Client Site [place] Physician Ria Bush - MD Contact Type Call Who Is Calling Patient / Member / Family / Caregiver Call Type Triage / Clinical Caller Name Shariah Assad Relationship To Patient Sibling Return Phone Number [phone] (Primary) Chief Complaint Back Pain - General Reason for Call Symptomatic / Request for Health Information Initial Comment Caller needing to get abx called in for wife, having pain in kidney area, sent in spec yesterday Translation No Nurse Assessment Nurse: Lavera Guise, RN, Vaughan Basta Date/Time (Eastern Time): 11/15/2016 12:17:57 PM Confirm and document reason for call. If symptomatic, describe symptoms. ---Caller states she is having bladder pressure causing urinary frequency. Was prescribed antibiotics. Got better and now came back since stopping antibiotics about 4 days ago. Caller states  she called office yesterday and was supposed to have a return call and never got a call. Does the patient have any new or worsening symptoms? ---Yes Will a triage be completed? ---Yes Related visit to physician within the last 2 weeks? ---Yes Does the PT have any chronic conditions? (i.e. diabetes, asthma, etc.) ---No Is this a behavioral health or substance abuse call? ---No Guidelines Guideline Title Affirmed Question Affirmed Notes Nurse Date/Time Eilene Ghazi Time) Urinary Tract Infection on Antibiotic Follow-up Call - Female [1] Taking antibiotic > 72 hours (3 days) for UTI AND [2] painful urination or frequency not improved Lavera Guise, RN, Vaughan Basta 11/15/2016 12:23:38 PM Disp. Time Eilene Ghazi Time) Disposition Final User 11/15/2016 12:26:16 PM See Physician within 24 Hours Yes Kluth, RN, Vaughan Basta PLEASE NOTE: All timestamps contained within this report are represented as [place] Standard Time. CONFIDENTIALTY NOTICE: This fax transmission is intended only for the addressee. It contains information that is legally privileged, confidential or otherwise protected from use or disclosure. If you are not the intended recipient, you are strictly prohibited from reviewing, disclosing, copying using or disseminating any of this information or taking any action in reliance on or regarding this information. If you have received this fax in error, please notify us immediately by telephone so that we can arrange for its return to [place]. Phone: [phone], Toll-Free: [phone], Fax: [phone] Page: 2 of 2 Call Id: [id number] [place] Disagree/Comply Comply Caller Understands Yes PreDisposition Call Doctor Care Advice Given Per Guideline SEE PHYSICIAN WITHIN 24 HOURS: FLUIDS: Drink extra fluids. Drink 8-10 glasses of liquids a day. (Reason: to produce a dilute, non-irritating urine.) IBUPROFEN (E.G., MOTRIN, ADVIL): * Take 400 mg (two 200 mg pills) by mouth every 6 hours as needed. * Another choice is to  take 600 mg (three 200 mg pills) by  mouth every 8 hours as needed. * You become worse. CALL BACK IF: CARE ADVICE given per Urinary Tract Infection on Antibiotic Follow-Up Call, Female (Adult) guideline. Referrals [facility]

## 2016-11-17 NOTE — Telephone Encounter (Signed)
Noted. Pt left urine sample today.

## 2016-11-17 NOTE — Telephone Encounter (Signed)
Urine specimen last week was for f/u hematuria - and was clear of blood. I was not made aware patient was having ongoing symptoms. I spoke with pt and husband -and have asked them to drop off another specimen today (lab visit only) to send off for culture (ordered).  I asked her to start cipro 500mg  bd x 7 day course after she gives [place] specimen in office.  Lab Results  Component Value Date   CREATININE 0.85 09/07/2015

## 2016-11-17 NOTE — Addendum Note (Signed)
Addended by: Ria Bush on: 11/17/2016 01:23 PM   Modules accepted: Orders

## 2016-11-17 NOTE — Telephone Encounter (Signed)
See other phone note. Pt did not tell me she had ongoing symptoms.

## 2016-11-18 LAB — URINE CULTURE
MICRO NUMBER: 81209342
SPECIMEN QUALITY: ADEQUATE

## 2016-12-13 ENCOUNTER — Other Ambulatory Visit: Payer: Self-pay | Admitting: Family Medicine

## 2017-01-04 ENCOUNTER — Other Ambulatory Visit: Payer: Self-pay | Admitting: Family Medicine

## 2017-01-05 NOTE — Telephone Encounter (Signed)
Last filled:  10/06/16, #180 Last OV:  11/03/16 Next OV:  none

## 2017-02-10 ENCOUNTER — Other Ambulatory Visit: Payer: Self-pay | Admitting: Family Medicine

## 2017-03-24 ENCOUNTER — Telehealth: Payer: Self-pay | Admitting: Family Medicine

## 2017-03-24 NOTE — Telephone Encounter (Signed)
Copied from [place] [id number]. Topic: Quick Communication - Rx Refill/Question >> Mar 24, 2017  9:39 AM Boyd Kerbs wrote: Medication:  atorvastatin (LIPITOR) 40 MG tablet  [facility] has been [facility] for a week  She is out of medication and needing this asap   Has the patient contacted their pharmacy? Yes.     (Agent: If no, request that the patient contact the pharmacy for the refill.)   Preferred Pharmacy (with phone number or street name):  [id number] - [place], [address] [place] [address] Phone: [phone] Fax: [phone]  Agent: Please be advised that RX refills may take up to 3 business days. We ask that you follow-up with your pharmacy.

## 2017-03-25 ENCOUNTER — Other Ambulatory Visit: Payer: Self-pay

## 2017-03-25 MED ORDER — ATORVASTATIN CALCIUM 40 MG PO TABS: 40.0000 mg | ORAL_TABLET | Freq: Every day | ORAL | 1 refills | Status: DC

## 2017-04-17 ENCOUNTER — Other Ambulatory Visit: Payer: Self-pay | Admitting: Family Medicine

## 2017-05-17 ENCOUNTER — Other Ambulatory Visit: Payer: Self-pay | Admitting: Family Medicine

## 2017-05-18 NOTE — Telephone Encounter (Signed)
Electronic refill request Medication list shows Simvastatin and Atorvastatin Last office visit 11/03/16 Last lab work 03/13/16

## 2017-05-25 ENCOUNTER — Other Ambulatory Visit: Payer: Self-pay | Admitting: Family Medicine

## 2017-06-18 ENCOUNTER — Telehealth: Payer: Self-pay

## 2017-06-18 DIAGNOSIS — M810 Age-related osteoporosis without current pathological fracture: Secondary | ICD-10-CM

## 2017-06-18 DIAGNOSIS — E538 Deficiency of other specified B group vitamins: Secondary | ICD-10-CM

## 2017-06-18 DIAGNOSIS — Z1159 Encounter for screening for other viral diseases: Secondary | ICD-10-CM

## 2017-06-18 DIAGNOSIS — E785 Hyperlipidemia, unspecified: Secondary | ICD-10-CM

## 2017-06-18 NOTE — Telephone Encounter (Signed)
pts husband called (DPR signed) and request pt to have lab appt prior to 06/25/17 11:00 am CPX with out pap at the same time [name] [facility] lab appt is scheduled [name] lab appt has not been scheduled yet but phone note sent to Dr Darnell Level to order labs).Gordon request cb when can schedule both lab appts.

## 2017-06-18 NOTE — Telephone Encounter (Signed)
Call was disconnected with pt while trying to schedule lab appt for early next week, per Dr. Darnell Level.  Called pt back, went straight to vm.  Left message on vm per dpr asking pt to call back to schedule lab visit.

## 2017-06-18 NOTE — Telephone Encounter (Signed)
plz call and schedule lab visit for early next week.

## 2017-06-19 NOTE — Telephone Encounter (Signed)
Pt scheduled for lab visit on 06/23/17 @ 9:35a.

## 2017-06-22 NOTE — Addendum Note (Signed)
Addended by: Ria Bush on: 06/22/2017 11:43 PM   Modules accepted: Orders

## 2017-06-23 ENCOUNTER — Other Ambulatory Visit (INDEPENDENT_AMBULATORY_CARE_PROVIDER_SITE_OTHER): Payer: Federal, State, Local not specified - PPO

## 2017-06-23 DIAGNOSIS — M810 Age-related osteoporosis without current pathological fracture: Secondary | ICD-10-CM | POA: Diagnosis not present

## 2017-06-23 DIAGNOSIS — E785 Hyperlipidemia, unspecified: Secondary | ICD-10-CM

## 2017-06-23 DIAGNOSIS — E538 Deficiency of other specified B group vitamins: Secondary | ICD-10-CM | POA: Diagnosis not present

## 2017-06-23 DIAGNOSIS — Z1159 Encounter for screening for other viral diseases: Secondary | ICD-10-CM | POA: Diagnosis not present

## 2017-06-23 LAB — COMPREHENSIVE METABOLIC PANEL
ALBUMIN: 4 g/dL (ref 3.5–5.2)
ALK PHOS: 32 U/L — AB (ref 39–117)
ALT: 15 U/L (ref 0–35)
AST: 16 U/L (ref 0–37)
BUN: 13 mg/dL (ref 6–23)
CALCIUM: 9.5 mg/dL (ref 8.4–10.5)
CHLORIDE: 103 meq/L (ref 96–112)
CO2: 28 mEq/L (ref 19–32)
CREATININE: 0.73 mg/dL (ref 0.40–1.20)
GFR: 83.06 mL/min (ref >60.00)
Glucose, Bld: 93 mg/dL (ref 70–99)
Potassium: 4.2 mEq/L (ref 3.5–5.1)
Sodium: 141 mEq/L (ref 135–145)
TOTAL PROTEIN: 7.6 g/dL (ref 6.0–8.3)
Total Bilirubin: 0.6 mg/dL (ref 0.2–1.2)

## 2017-06-23 LAB — VITAMIN D 25 HYDROXY (VIT D DEFICIENCY, FRACTURES): VITD: 33.17 ng/mL (ref 30.00–100.00)

## 2017-06-23 LAB — LIPID PANEL
CHOL/HDL RATIO: 4
Cholesterol: 189 mg/dL (ref 0–200)
HDL: 48.7 mg/dL (ref >39.00)
NonHDL: 139.86
Triglycerides: 286 mg/dL — ABNORMAL HIGH (ref 0.0–149.0)
VLDL: 57.2 mg/dL — AB (ref 0.0–40.0)

## 2017-06-23 LAB — LDL CHOLESTEROL, DIRECT: Direct LDL: 104 mg/dL

## 2017-06-23 LAB — VITAMIN B12: VITAMIN B 12: 198 pg/mL — AB (ref 211–911)

## 2017-06-23 LAB — TSH: TSH: 5.07 u[IU]/mL — ABNORMAL HIGH (ref 0.35–4.50)

## 2017-06-24 LAB — HEPATITIS C ANTIBODY
Hepatitis C Ab: NONREACTIVE
SIGNAL TO CUT-OFF: 0.03 (ref <1.00)

## 2017-06-25 ENCOUNTER — Ambulatory Visit (INDEPENDENT_AMBULATORY_CARE_PROVIDER_SITE_OTHER): Payer: Federal, State, Local not specified - PPO | Admitting: Family Medicine

## 2017-06-25 ENCOUNTER — Ambulatory Visit (INDEPENDENT_AMBULATORY_CARE_PROVIDER_SITE_OTHER)
Admission: RE | Admit: 2017-06-25 | Discharge: 2017-06-25 | Disposition: A | Discharge status: Home / Self Care | Payer: Federal, State, Local not specified - PPO | Source: Ambulatory Visit | Attending: Family Medicine | Admitting: Family Medicine

## 2017-06-25 ENCOUNTER — Encounter: Payer: Self-pay | Admitting: Family Medicine

## 2017-06-25 ENCOUNTER — Ambulatory Visit: Payer: Federal, State, Local not specified - PPO | Admitting: Family Medicine

## 2017-06-25 VITALS — BP 124/82 | HR 85 | Temp 98.0°F | Ht 64.0 in | Wt 146.0 lb

## 2017-06-25 DIAGNOSIS — R7989 Other specified abnormal findings of blood chemistry: Secondary | ICD-10-CM | POA: Diagnosis not present

## 2017-06-25 DIAGNOSIS — E785 Hyperlipidemia, unspecified: Secondary | ICD-10-CM | POA: Diagnosis not present

## 2017-06-25 DIAGNOSIS — R1030 Lower abdominal pain, unspecified: Secondary | ICD-10-CM

## 2017-06-25 DIAGNOSIS — R0789 Other chest pain: Secondary | ICD-10-CM | POA: Diagnosis not present

## 2017-06-25 DIAGNOSIS — Z23 Encounter for immunization: Secondary | ICD-10-CM | POA: Diagnosis not present

## 2017-06-25 DIAGNOSIS — Z87891 Personal history of nicotine dependence: Secondary | ICD-10-CM

## 2017-06-25 DIAGNOSIS — G6289 Other specified polyneuropathies: Secondary | ICD-10-CM

## 2017-06-25 DIAGNOSIS — Z0001 Encounter for general adult medical examination with abnormal findings: Secondary | ICD-10-CM | POA: Diagnosis not present

## 2017-06-25 DIAGNOSIS — J449 Chronic obstructive pulmonary disease, unspecified: Secondary | ICD-10-CM

## 2017-06-25 DIAGNOSIS — M810 Age-related osteoporosis without current pathological fracture: Secondary | ICD-10-CM

## 2017-06-25 DIAGNOSIS — Z0000 Encounter for general adult medical examination without abnormal findings: Secondary | ICD-10-CM | POA: Insufficient documentation

## 2017-06-25 DIAGNOSIS — E538 Deficiency of other specified B group vitamins: Secondary | ICD-10-CM | POA: Diagnosis not present

## 2017-06-25 DIAGNOSIS — Z7189 Other specified counseling: Secondary | ICD-10-CM

## 2017-06-25 LAB — POC URINALSYSI DIPSTICK (AUTOMATED)
BILIRUBIN UA: NEGATIVE
Blood, UA: NEGATIVE
GLUCOSE UA: NEGATIVE
Ketones, UA: NEGATIVE
LEUKOCYTES UA: NEGATIVE
NITRITE UA: NEGATIVE
Protein, UA: NEGATIVE
Spec Grav, UA: 1.02 (ref 1.010–1.025)
UROBILINOGEN UA: 0.2 U/dL
pH, UA: 6 (ref 5.0–8.0)

## 2017-06-25 MED ORDER — LOVASTATIN 40 MG PO TABS: 40.0000 mg | ORAL_TABLET | Freq: Every day | ORAL | 11 refills | Status: DC

## 2017-06-25 MED ORDER — CYANOCOBALAMIN 1000 MCG/ML IJ SOLN
1000.0000 ug | Freq: Once | INTRAMUSCULAR | Status: AC
  Administered 2017-06-25: 1000 ug via INTRAMUSCULAR

## 2017-06-25 NOTE — Assessment & Plan Note (Signed)
Preventative protocols reviewed and updated unless pt declined. Discussed healthy diet and lifestyle.  

## 2017-06-25 NOTE — Progress Notes (Signed)
BP 124/82 (BP Location: Left Arm, Patient Position: Sitting, Cuff Size: Normal)   Pulse 85   Temp 98 F (36.7 C) (Oral)   Ht 5\' 4"  (1.626 m)   Wt 146 lb (66.2 kg)   SpO2 97%   BMI 25.06 kg/m    CC: CPE Subjective:    Patient ID: Cassie Shaw, female    DOB: 1945-08-23, 72 y.o.   MRN: [id number]  HPI: Cassie Shaw is a 72 y.o. female presenting on 06/25/2017 for Annual Exam (States she stopped atorvastatin due to burning in feet. Burning has stopped.); Abdominal Pain (Lower abd pain/pressure in hypogastric area. Has had off and on for a long time. More constant in past week. ); and Frequent tripping (States she has been tripping over her feet a lot in the past weeks.)   No recent CPE.   She had a fall 2 wks ago - tripped over mat on porch. Injury L lateral ankle. Used walker until it started feeling better.   Longstanding neuropathy s/p normal NCS 09/2015. She treats this with gabapentin 600mg  BID and aleve BID. Ongoing trouble. Predominantly bothersome at night time.   HLD - atorvastatin may have causing feet to burn. zocor had similar effect. Requests low chol diet and different statin if still needed.   Ongoing R thoracic back pain worsening for months. No significant cough or dyspnea or unexpected weight changes.   Preventative: Colon cancer screening - pt states she's had remote colonoscopy with Dr Collene Mares.  Lung cancer screening - quit 2006, prior ~30 PY hx.  Breast cancer screening - overdue  Well woman exam - s/p partial hysterectomy 1975 for heavy bleeding, ovaries remain. No recent pelvic exam.  DEXA 01/2013 T -3.5 spine, -2.5 hip osteoporosis on fosamax, doesn't remember to take regularly.  Flu shot - yearly Td 2007 Prevnar - today.  Shingrix - discussed Advanced directive discussion - would want daughter [name] to be HCPOA. Ok with temporary measure, doesn't want prolonged life support if terminal condition. Packet provided today.  Seat belt use  discussed Sunscreen use discussed. No changing moles on skin.  Smoking - ex smoker quit remotely Alcohol  - none Dentist - due - needs partials Eye exam - due - last saw 3 yrs ago  Lives with husband [name]), 2 dogs and a bird Occupation: retired, was Armed forces logistics/support/administrative officer Activity: no regular exercise Diet: good water, fruits/vegetables daily  Relevant past medical, surgical, family and social history reviewed and updated as indicated. Interim medical history since our last visit reviewed. Allergies and medications reviewed and updated. Outpatient Medications Prior to Visit  Medication Sig Dispense Refill  . alendronate (FOSAMAX) 70 MG tablet TAKE 1 TABLET (70 MG TOTAL) BY MOUTH ONCE A WEEK. 12 tablet 0  . Cholecalciferol (VITAMIN D3) 1000 units CAPS Take 1 capsule (1,000 Units total) by mouth daily. 30 capsule   . gabapentin (NEURONTIN) 600 MG tablet TAKE 1 TABLET BY MOUTH TWICE A DAY 180 tablet 1  . Multiple Vitamin (MULTIVITAMIN) tablet Take 1 tablet by mouth daily.    . naproxen sodium (ANAPROX) 220 MG tablet Take 220 mg by mouth 2 (two) times daily with a meal.    . atorvastatin (LIPITOR) 40 MG tablet TAKE 1 TABLET BY MOUTH EVERY DAY (Patient not taking: Reported on 06/25/2017) 90 tablet 0  . Calcium Carb-Cholecalciferol (CALCIUM-VITAMIN D3) 600-400 MG-UNIT CAPS Take 1 capsule by mouth daily.    . cephALEXin (KEFLEX) 500 MG capsule Take 1 capsule (500 mg  total) by mouth 2 (two) times daily. 14 capsule 0  . ciprofloxacin (CIPRO) 500 MG tablet Take 1 tablet (500 mg total) by mouth 2 (two) times daily. 14 tablet 0  . NON FORMULARY [facility]  Peripheral Neuropathy Cream- Bupivacaine 1%, Doxepin 3%, Gabapentin 6%, Pentoxifylline 3%, Topiramate 1% Apply 1-2 grams to affected area 3-4 times daily Qty. 120 gm 3 refills    . NONFORMULARY OR COMPOUNDED ITEM [facility]: Peripheral Neuropathy cream - Bupivacaine 1%, Doxepin 3%, Gabapentin 6%, Pentoxifylline 3%, Topiramate 1%, apply 1-2  grams to affected area 3-4 times daily. 120 each 2   No facility-administered medications prior to visit.      Per HPI unless specifically indicated in ROS section below Review of Systems  Constitutional: Negative for activity change, appetite change, chills, fatigue, fever and unexpected weight change.  HENT: Negative for hearing loss.   Eyes: Negative for visual disturbance.  Respiratory: Negative for cough, chest tightness, shortness of breath and wheezing.   Cardiovascular: Negative for chest pain, palpitations and leg swelling.  Gastrointestinal: Positive for constipation (chronic). Negative for abdominal distention, abdominal pain, blood in stool, diarrhea, nausea and vomiting.  Genitourinary: Negative for difficulty urinating and hematuria.  Musculoskeletal: Positive for back pain (right back). Negative for arthralgias, myalgias and neck pain.  Skin: Negative for rash.  Neurological: Positive for dizziness (with turning over in bed). Negative for seizures, syncope and headaches.       Increasing falls  Hematological: Negative for adenopathy. Does not bruise/bleed easily.  Psychiatric/Behavioral: Positive for dysphoric mood. The patient is nervous/anxious.        Objective:    BP 124/82 (BP Location: Left Arm, Patient Position: Sitting, Cuff Size: Normal)   Pulse 85   Temp 98 F (36.7 C) (Oral)   Ht 5\' 4"  (1.626 m)   Wt 146 lb (66.2 kg)   SpO2 97%   BMI 25.06 kg/m   Wt Readings from Last 3 Encounters:  06/25/17 146 lb (66.2 kg)  11/03/16 147 lb 8 oz (66.9 kg)  04/17/16 142 lb (64.4 kg)    Physical Exam  Constitutional: She is oriented to person, place, and time. She appears well-developed and well-nourished. No distress.  HENT:  Head: Normocephalic and atraumatic.  Right Ear: Hearing, tympanic membrane, external ear and ear canal normal.  Left Ear: Hearing, tympanic membrane, external ear and ear canal normal.  Nose: Nose normal.  Mouth/Throat: Uvula is midline,  oropharynx is clear and moist and mucous membranes are normal. No oropharyngeal exudate, posterior oropharyngeal edema or posterior oropharyngeal erythema.  Eyes: Pupils are equal, round, and reactive to light. Conjunctivae and EOM are normal. No scleral icterus.  Neck: Normal range of motion. Neck supple.  Cardiovascular: Normal rate, regular rhythm, normal heart sounds and intact distal pulses.  No murmur heard. Pulses:      Radial pulses are 2+ on the right side, and 2+ on the left side.  Pulmonary/Chest: Effort normal. No respiratory distress. She has decreased breath sounds (right sided). She has no wheezes. She has no rhonchi. She has no rales. She exhibits no tenderness.  Abdominal: Soft. Bowel sounds are normal. She exhibits no distension and no mass. There is no tenderness. There is no rebound and no guarding.  Musculoskeletal: Normal range of motion. She exhibits no edema.  Lymphadenopathy:    She has no cervical adenopathy.  Neurological: She is alert and oriented to person, place, and time.  CN grossly intact, station and gait intact  Skin: Skin is  warm and dry. No rash noted.  Psychiatric: She has a normal mood and affect. Her behavior is normal. Judgment and thought content normal.  Nursing note and vitals reviewed.  Results for orders placed or performed in visit on 06/25/17  POCT Urinalysis Dipstick (Automated)  Result Value Ref Range   Color, UA yellow    Clarity, UA clear    Glucose, UA Negative Negative   Bilirubin, UA negative    Ketones, UA negative    Spec Grav, UA 1.020 1.010 - 1.025   Blood, UA negative    pH, UA 6.0 5.0 - 8.0   Protein, UA Negative Negative   Urobilinogen, UA 0.2 0.2 or 1.0 E.U./dL   Nitrite, UA negative    Leukocytes, UA Negative Negative   Lab Results  Component Value Date   CHOL 189 06/23/2017   HDL 48.70 06/23/2017   LDLDIRECT 104.0 06/23/2017   TRIG 286.0 (H) 06/23/2017   CHOLHDL 4 06/23/2017       Assessment & Plan:    Problem List Items Addressed This Visit    Vitamin B12 deficiency    b12 level low. Start b12 shots today.       Relevant Medications   cyanocobalamin ((VITAMIN B-12)) injection 1,000 mcg (Completed)   Right-sided chest wall pain    Longstanding R sided thoracic pain with some R sided crackles - in long smoking history, check CXR today.       Relevant Orders   DG Chest 2 View (Completed)   Peripheral neuropathy    Continue gabapentin 600mg  bid ?recent falls related to worsening neuropathy      Osteoporosis    Continue foasamax daily.       Lower abdominal pain    Check UA today to eval for UTI.       Relevant Orders   POCT Urinalysis Dipstick (Automated) (Completed)   HLD (hyperlipidemia)    Did not tolerate atorvastatin or simvastatin - may have worsened burning in feet. Will trial lovastatin.       Relevant Medications   lovastatin (MEVACOR) 40 MG tablet   Health maintenance examination - Primary    Preventative protocols reviewed and updated unless pt declined. Discussed healthy diet and lifestyle.       Ex-smoker    Discussed lung cancer screening - pt interested so will refer.      Relevant Orders   Ambulatory Referral for Lung Cancer Scre   COPD (chronic obstructive pulmonary disease) ([place])    Stable off medication. Ex smoker quit remotely.       Advanced care planning/counseling discussion    Advanced directive discussion - would want daughter [name] to be HCPOA. Ok with temporary measure, doesn't want prolonged life support if terminal condition. Packet provided today.       Abnormal TSH    TSH elevated ?hypothyroidism. Will reassess at f/u visit.        Other Visit Diagnoses    Need for vaccination with 13-polyvalent pneumococcal conjugate vaccine       Relevant Orders   Pneumococcal conjugate vaccine 13-valent IM (Completed)       Meds ordered this encounter  Medications  . lovastatin (MEVACOR) 40 MG tablet    Sig: Take 1  tablet (40 mg total) by mouth at bedtime.    Dispense:  30 tablet    Refill:  11  . cyanocobalamin ((VITAMIN B-12)) injection 1,000 mcg   Orders Placed This Encounter  Procedures  . DG Chest 2 View  Standing Status:   Future    Number of Occurrences:   1    Standing Expiration Date:   08/26/2018    Order Specific Question:   Reason for Exam (SYMPTOM  OR DIAGNOSIS REQUIRED)    Answer:   chronic R chest pain    Order Specific Question:   Preferred imaging location?    Answer:   [facility]    Order Specific Question:   Radiology Contrast Protocol - do NOT remove file path    Answer:   [email or web address]  . Pneumococcal conjugate vaccine 13-valent IM  . Ambulatory Referral for Lung Cancer Scre    Referral Priority:   Routine    Referral Type:   Consultation    Referral Reason:   Specialty Services Required    Number of Visits Requested:   1  . POCT Urinalysis Dipstick (Automated)    Follow up plan: Return in about 1 month (around 07/23/2017) for follow up visit.  Ria Bush, MD

## 2017-06-25 NOTE — Patient Instructions (Addendum)
Prevnar today.  Low cholesterol diet handout provided today.  Advanced directive packet provided today.  Sign release for colonoscopy report from Dr Collene Mares. Call Dr Collene Mares to schedule GI appointment.  Call [facility]) to schedule mammogram.  We will refer you to lung cancer screening nurse to discuss possible CT scan at [facility].  If interested, check with pharmacy about new 2 shot shingles series (shingrix).  Schedule eye exam at your convenience.  Vitamin B12 was low - start monthly B12 shots.  Stop atorvastatin, start lovastatin 48m daily.  Chest xray today.  Health Maintenance, Female Adopting a healthy lifestyle and getting preventive care can go a long way to promote health and wellness. Talk with your health care provider about what schedule of regular examinations is right for you. This is a good chance for you to check in with your provider about disease prevention and staying healthy. In between checkups, there are plenty of things you can do on your own. Experts have done a lot of research about which lifestyle changes and preventive measures are most likely to keep you healthy. Ask your health care provider for more information. Weight and diet Eat a healthy diet  Be sure to include plenty of vegetables, fruits, low-fat dairy products, and lean protein.  Do not eat a lot of foods high in solid fats, added sugars, or salt.  Get regular exercise. This is one of the most important things you can do for your health. ? Most adults should exercise for at least 150 minutes each week. The exercise should increase your heart rate and make you sweat (moderate-intensity exercise). ? Most adults should also do strengthening exercises at least twice a week. This is in addition to the moderate-intensity exercise.  Maintain a healthy weight  Body mass index (BMI) is a measurement that can be used to identify possible weight problems. It estimates body fat based on  height and weight. Your health care provider can help determine your BMI and help you achieve or maintain a healthy weight.  For females 22years of age and older: ? A BMI below 18.5 is considered underweight. ? A BMI of 18.5 to 24.9 is normal. ? A BMI of 25 to 29.9 is considered overweight. ? A BMI of 30 and above is considered obese.  Watch levels of cholesterol and blood lipids  You should start having your blood tested for lipids and cholesterol at 73years of age, then have this test every 5 years.  You may need to have your cholesterol levels checked more often if: ? Your lipid or cholesterol levels are high. ? You are older than 73years of age. ? You are at high risk for heart disease.  Cancer screening Lung Cancer  Lung cancer screening is recommended for adults [id number] old who are at high risk for lung cancer because of a history of smoking.  A yearly low-dose CT scan of the lungs is recommended for people who: ? Currently smoke. ? Have quit within the past 15 years. ? Have at least a 30-pack-year history of smoking. A pack year is smoking an average of one pack of cigarettes a day for 1 year.  Yearly screening should continue until it has been 15 years since you quit.  Yearly screening should stop if you develop a health problem that would prevent you from having lung cancer treatment.  Breast Cancer  Practice breast self-awareness. This means understanding how your breasts normally appear and feel.  It also means doing regular breast self-exams. Let your health care provider know about any changes, no matter how small.  If you are in your 20s or 30s, you should have a clinical breast exam (CBE) by a health care provider every 1-3 years as part of a regular health exam.  If you are 42 or older, have a CBE every year. Also consider having a breast X-ray (mammogram) every year.  If you have a family history of breast cancer, talk to your health care provider  about genetic screening.  If you are at high risk for breast cancer, talk to your health care provider about having an MRI and a mammogram every year.  Breast cancer gene (BRCA) assessment is recommended for women who have family members with BRCA-related cancers. BRCA-related cancers include: ? Breast. ? Ovarian. ? Tubal. ? Peritoneal cancers.  Results of the assessment will determine the need for genetic counseling and BRCA1 and BRCA2 testing.  Cervical Cancer Your health care provider may recommend that you be screened regularly for cancer of the pelvic organs (ovaries, uterus, and vagina). This screening involves a pelvic examination, including checking for microscopic changes to the surface of your cervix (Pap test). You may be encouraged to have this screening done every 3 years, beginning at age 37.  For women ages 24-65, health care providers may recommend pelvic exams and Pap testing every 3 years, or they may recommend the Pap and pelvic exam, combined with testing for human papilloma virus (HPV), every 5 years. Some types of HPV increase your risk of cervical cancer. Testing for HPV may also be done on women of any age with unclear Pap test results.  Other health care providers may not recommend any screening for nonpregnant women who are considered low risk for pelvic cancer and who do not have symptoms. Ask your health care provider if a screening pelvic exam is right for you.  If you have had past treatment for cervical cancer or a condition that could lead to cancer, you need Pap tests and screening for cancer for at least 20 years after your treatment. If Pap tests have been discontinued, your risk factors (such as having a new sexual partner) need to be reassessed to determine if screening should resume. Some women have medical problems that increase the chance of getting cervical cancer. In these cases, your health care provider may recommend more frequent screening and Pap  tests.  Colorectal Cancer  This type of cancer can be detected and often prevented.  Routine colorectal cancer screening usually begins at 73 years of age and continues through 73 years of age.  Your health care provider may recommend screening at an earlier age if you have risk factors for colon cancer.  Your health care provider may also recommend using home test kits to check for hidden blood in the stool.  A small camera at the end of a tube can be used to examine your colon directly (sigmoidoscopy or colonoscopy). This is done to check for the earliest forms of colorectal cancer.  Routine screening usually begins at age 32.  Direct examination of the colon should be repeated every 5-10 years through 73 years of age. However, you may need to be screened more often if early forms of precancerous polyps or small growths are found.  Skin Cancer  Check your skin from head to toe regularly.  Tell your health care provider about any new moles or changes in moles, especially if there is a  change in a mole's shape or color.  Also tell your health care provider if you have a mole that is larger than the size of a pencil eraser.  Always use sunscreen. Apply sunscreen liberally and repeatedly throughout the day.  Protect yourself by wearing long sleeves, pants, a wide-brimmed hat, and sunglasses whenever you are outside.  Heart disease, diabetes, and high blood pressure  High blood pressure causes heart disease and increases the risk of stroke. High blood pressure is more likely to develop in: ? People who have blood pressure in the high end of the normal range (130-139/85-89 mm Hg). ? People who are overweight or obese. ? People who are African American.  If you are 18-39 years of age, have your blood pressure checked every 3-5 years. If you are 40 years of age or older, have your blood pressure checked every year. You should have your blood pressure measured twice-once when you are at  a hospital or clinic, and once when you are not at a hospital or clinic. Record the average of the two measurements. To check your blood pressure when you are not at a hospital or clinic, you can use: ? An automated blood pressure machine at a pharmacy. ? A home blood pressure monitor.  If you are between 55 years and 79 years old, ask your health care provider if you should take aspirin to prevent strokes.  Have regular diabetes screenings. This involves taking a blood sample to check your fasting blood sugar level. ? If you are at a normal weight and have a low risk for diabetes, have this test once every three years after 73 years of age. ? If you are overweight and have a high risk for diabetes, consider being tested at a younger age or more often. Preventing infection Hepatitis B  If you have a higher risk for hepatitis B, you should be screened for this virus. You are considered at high risk for hepatitis B if: ? You were born in a country where hepatitis B is common. Ask your health care provider which countries are considered high risk. ? Your parents were born in a high-risk country, and you have not been immunized against hepatitis B (hepatitis B vaccine). ? You have HIV or AIDS. ? You use needles to inject street drugs. ? You live with someone who has hepatitis B. ? You have had sex with someone who has hepatitis B. ? You get hemodialysis treatment. ? You take certain medicines for conditions, including cancer, organ transplantation, and autoimmune conditions.  Hepatitis C  Blood testing is recommended for: ? Everyone born from 1945 through 1965. ? Anyone with known risk factors for hepatitis C.  Sexually transmitted infections (STIs)  You should be screened for sexually transmitted infections (STIs) including gonorrhea and chlamydia if: ? You are sexually active and are younger than 73 years of age. ? You are older than 73 years of age and your health care provider tells  you that you are at risk for this type of infection. ? Your sexual activity has changed since you were last screened and you are at an increased risk for chlamydia or gonorrhea. Ask your health care provider if you are at risk.  If you do not have HIV, but are at risk, it may be recommended that you take a prescription medicine daily to prevent HIV infection. This is called pre-exposure prophylaxis (PrEP). You are considered at risk if: ? You are sexually active and do not regularly   use condoms or know the HIV status of your partner(s). ? You take drugs by injection. ? You are sexually active with a partner who has HIV.  Talk with your health care provider about whether you are at high risk of being infected with HIV. If you choose to begin PrEP, you should first be tested for HIV. You should then be tested every 3 months for as long as you are taking PrEP. Pregnancy  If you are premenopausal and you may become pregnant, ask your health care provider about preconception counseling.  If you may become pregnant, take 400 to 800 micrograms (mcg) of folic acid every day.  If you want to prevent pregnancy, talk to your health care provider about birth control (contraception). Osteoporosis and menopause  Osteoporosis is a disease in which the bones lose minerals and strength with aging. This can result in serious bone fractures. Your risk for osteoporosis can be identified using a bone density scan.  If you are 65 years of age or older, or if you are at risk for osteoporosis and fractures, ask your health care provider if you should be screened.  Ask your health care provider whether you should take a calcium or vitamin D supplement to lower your risk for osteoporosis.  Menopause may have certain physical symptoms and risks.  Hormone replacement therapy may reduce some of these symptoms and risks. Talk to your health care provider about whether hormone replacement therapy is right for  you. Follow these instructions at home:  Schedule regular health, dental, and eye exams.  Stay current with your immunizations.  Do not use any tobacco products including cigarettes, chewing tobacco, or electronic cigarettes.  If you are pregnant, do not drink alcohol.  If you are breastfeeding, limit how much and how often you drink alcohol.  Limit alcohol intake to no more than 1 drink per day for nonpregnant women. One drink equals 12 ounces of beer, 5 ounces of wine, or 1 ounces of hard liquor.  Do not use street drugs.  Do not share needles.  Ask your health care provider for help if you need support or information about quitting drugs.  Tell your health care provider if you often feel depressed.  Tell your health care provider if you have ever been abused or do not feel safe at home. This information is not intended to replace advice given to you by your health care provider. Make sure you discuss any questions you have with your health care provider. Document Released: 07/22/2010 Document Revised: 06/14/2015 Document Reviewed: 10/10/2014 Elsevier Interactive Patient Education  2018 [employer]  

## 2017-06-26 ENCOUNTER — Ambulatory Visit
Admission: RE | Admit: 2017-06-26 | Discharge: 2017-06-26 | Disposition: A | Discharge status: Home / Self Care | Payer: Federal, State, Local not specified - PPO | Source: Ambulatory Visit | Attending: Family Medicine | Admitting: Family Medicine

## 2017-06-26 ENCOUNTER — Other Ambulatory Visit: Payer: Self-pay | Admitting: Family Medicine

## 2017-06-26 ENCOUNTER — Ambulatory Visit
Admission: RE | Admit: 2017-06-26 | Payer: Federal, State, Local not specified - PPO | Source: Ambulatory Visit | Admitting: *Deleted

## 2017-06-26 ENCOUNTER — Encounter: Payer: Self-pay | Admitting: Family Medicine

## 2017-06-26 DIAGNOSIS — J9811 Atelectasis: Secondary | ICD-10-CM | POA: Diagnosis not present

## 2017-06-26 DIAGNOSIS — R0789 Other chest pain: Secondary | ICD-10-CM | POA: Diagnosis not present

## 2017-06-26 DIAGNOSIS — R9389 Abnormal findings on diagnostic imaging of other specified body structures: Secondary | ICD-10-CM

## 2017-06-26 DIAGNOSIS — J449 Chronic obstructive pulmonary disease, unspecified: Secondary | ICD-10-CM | POA: Insufficient documentation

## 2017-06-26 DIAGNOSIS — J438 Other emphysema: Secondary | ICD-10-CM | POA: Insufficient documentation

## 2017-06-26 DIAGNOSIS — J984 Other disorders of lung: Secondary | ICD-10-CM | POA: Insufficient documentation

## 2017-06-26 DIAGNOSIS — I700 Atherosclerosis of aorta: Secondary | ICD-10-CM | POA: Insufficient documentation

## 2017-06-26 DIAGNOSIS — I2510 Atherosclerotic heart disease of native coronary artery without angina pectoris: Secondary | ICD-10-CM | POA: Diagnosis not present

## 2017-06-26 DIAGNOSIS — J432 Centrilobular emphysema: Secondary | ICD-10-CM | POA: Insufficient documentation

## 2017-06-26 DIAGNOSIS — Z1231 Encounter for screening mammogram for malignant neoplasm of breast: Secondary | ICD-10-CM

## 2017-06-26 LAB — POCT I-STAT CREATININE: Creatinine, Ser: 0.7 mg/dL (ref 0.44–1.00)

## 2017-06-26 MED ORDER — IOHEXOL 300 MG/ML  SOLN
75.0000 mL | Freq: Once | INTRAMUSCULAR | Status: AC | PRN
  Administered 2017-06-26: 75 mL via INTRAVENOUS

## 2017-06-28 DIAGNOSIS — R0789 Other chest pain: Secondary | ICD-10-CM | POA: Insufficient documentation

## 2017-06-28 DIAGNOSIS — Z87891 Personal history of nicotine dependence: Secondary | ICD-10-CM | POA: Insufficient documentation

## 2017-06-28 DIAGNOSIS — R1030 Lower abdominal pain, unspecified: Secondary | ICD-10-CM | POA: Insufficient documentation

## 2017-06-28 DIAGNOSIS — R7989 Other specified abnormal findings of blood chemistry: Secondary | ICD-10-CM | POA: Insufficient documentation

## 2017-06-28 DIAGNOSIS — Z7189 Other specified counseling: Secondary | ICD-10-CM | POA: Insufficient documentation

## 2017-06-28 NOTE — Assessment & Plan Note (Addendum)
Advanced directive discussion - would want daughter [name] to be HCPOA. Ok with temporary measure, doesn't want prolonged life support if terminal condition. Packet provided today.

## 2017-06-28 NOTE — Assessment & Plan Note (Addendum)
b12 level low. Start b12 shots today.

## 2017-06-28 NOTE — Assessment & Plan Note (Addendum)
Did not tolerate atorvastatin or simvastatin - may have worsened burning in feet. Will trial lovastatin.

## 2017-06-28 NOTE — Assessment & Plan Note (Signed)
TSH elevated ?hypothyroidism. Will reassess at f/u visit.

## 2017-06-28 NOTE — Assessment & Plan Note (Signed)
Continue foasamax daily.

## 2017-06-28 NOTE — Assessment & Plan Note (Signed)
Check UA today to eval for UTI.

## 2017-06-28 NOTE — Assessment & Plan Note (Addendum)
Continue gabapentin 600mg  bid ?recent falls related to worsening neuropathy

## 2017-06-28 NOTE — Assessment & Plan Note (Signed)
Longstanding R sided thoracic pain with some R sided crackles - in long smoking history, check CXR today.

## 2017-06-28 NOTE — Assessment & Plan Note (Signed)
Stable off medication. Ex smoker quit remotely.

## 2017-06-28 NOTE — Assessment & Plan Note (Signed)
Discussed lung cancer screening - pt interested so will refer.

## 2017-07-01 ENCOUNTER — Encounter: Payer: Self-pay | Admitting: Family Medicine

## 2017-07-06 ENCOUNTER — Ambulatory Visit: Payer: Federal, State, Local not specified - PPO

## 2017-07-20 ENCOUNTER — Other Ambulatory Visit: Payer: Self-pay | Admitting: Family Medicine

## 2017-07-21 NOTE — Telephone Encounter (Signed)
Gabapentin Last filled:  04/29/17, #180 Last OV (CPE):  06/25/17 Next OV:  07/29/17

## 2017-07-29 ENCOUNTER — Ambulatory Visit: Payer: Federal, State, Local not specified - PPO | Admitting: Family Medicine

## 2017-08-20 HISTORY — PX: COLONOSCOPY: SHX174

## 2017-08-24 LAB — HM COLONOSCOPY

## 2017-08-31 ENCOUNTER — Ambulatory Visit: Payer: Federal, State, Local not specified - PPO | Admitting: Family Medicine

## 2017-09-02 ENCOUNTER — Encounter: Payer: Self-pay | Admitting: Family Medicine

## 2017-09-02 ENCOUNTER — Ambulatory Visit: Payer: Federal, State, Local not specified - PPO | Admitting: Family Medicine

## 2017-09-02 VITALS — BP 116/80 | HR 86 | Temp 98.4°F | Ht 64.0 in | Wt 140.5 lb

## 2017-09-02 DIAGNOSIS — E538 Deficiency of other specified B group vitamins: Secondary | ICD-10-CM

## 2017-09-02 DIAGNOSIS — G6289 Other specified polyneuropathies: Secondary | ICD-10-CM

## 2017-09-02 DIAGNOSIS — I700 Atherosclerosis of aorta: Secondary | ICD-10-CM

## 2017-09-02 DIAGNOSIS — R0789 Other chest pain: Secondary | ICD-10-CM

## 2017-09-02 DIAGNOSIS — R7989 Other specified abnormal findings of blood chemistry: Secondary | ICD-10-CM

## 2017-09-02 LAB — TSH: TSH: 1.74 u[IU]/mL (ref 0.35–4.50)

## 2017-09-02 LAB — CBC WITH DIFFERENTIAL/PLATELET
Basophils Absolute: 0 10*3/uL (ref 0.0–0.1)
Basophils Relative: 0.5 % (ref 0.0–3.0)
EOS ABS: 0.1 10*3/uL (ref 0.0–0.7)
EOS PCT: 1.7 % (ref 0.0–5.0)
HCT: 42.5 % (ref 36.0–46.0)
Hemoglobin: 14.1 g/dL (ref 12.0–15.0)
LYMPHS ABS: 2.4 10*3/uL (ref 0.7–4.0)
Lymphocytes Relative: 27.2 % (ref 12.0–46.0)
MCHC: 33.2 g/dL (ref 30.0–36.0)
MCV: 89.9 fl (ref 78.0–100.0)
MONO ABS: 0.8 10*3/uL (ref 0.1–1.0)
Monocytes Relative: 9 % (ref 3.0–12.0)
NEUTROS PCT: 61.6 % (ref 43.0–77.0)
Neutro Abs: 5.4 10*3/uL (ref 1.4–7.7)
Platelets: 238 10*3/uL (ref 150.0–400.0)
RBC: 4.73 Mil/uL (ref 3.87–5.11)
RDW: 13.4 % (ref 11.5–15.5)
WBC: 8.7 10*3/uL (ref 4.0–10.5)

## 2017-09-02 LAB — T4, FREE: Free T4: 0.89 ng/dL (ref 0.60–1.60)

## 2017-09-02 LAB — VITAMIN B12: VITAMIN B 12: 662 pg/mL (ref 211–911)

## 2017-09-02 MED ORDER — CYANOCOBALAMIN 1000 MCG/ML IJ SOLN
1000.0000 ug | Freq: Once | INTRAMUSCULAR | Status: AC
  Administered 2017-09-02: 1000 ug via INTRAMUSCULAR

## 2017-09-02 NOTE — Assessment & Plan Note (Addendum)
Longstanding.  On gabapentin 600mg  BID.  Replete B12 then reassess.  Check further labs today including SPEP.  Anticipate idiopathic neuropathy.

## 2017-09-02 NOTE — Addendum Note (Signed)
Addended by: Lurlean Nanny on: 09/02/2017 01:45 PM   Modules accepted: Orders

## 2017-09-02 NOTE — Progress Notes (Signed)
BP 116/80 (BP Location: Left Arm, Patient Position: Sitting, Cuff Size: Normal)   Pulse 86   Temp 98.4 F (36.9 C) (Oral)   Ht 5\' 4"  (1.626 m)   Wt 140 lb 8 oz (63.7 kg)   SpO2 95%   BMI 24.12 kg/m    CC: 2 mo f/u visit Subjective:    Patient ID: Cassie Shaw, female    DOB: 02-21-1945, 72 y.o.   MRN: [id number]  HPI: CHEZNEY HUETHER is a 72 y.o. female presenting on 09/02/2017 for Follow-up (Here for 1 mo fu.)   Longstanding neuropathy s/p normal NCS 09/2015. She treats this with gabapentin 600mg  BID and aleve BID. Ongoing trouble. Predominantly bothersome at night time.She may try "revive it" TENS unit type machine for foot pain.   CT chest with contrast for R thoracic back pain - overall unrevealing - just emphysema, fibrosis, scarring. Denies dyspnea, wheeze, cough. No exertional dyspnea. Quit smoking 2006. Prior 40 PY hx.   Last visit we started B12 shots for deficiency. She only received 1 shot 06/2017. Has started oral replacement.   TSH was elevated - due for repeat testing.   Relevant past medical, surgical, family and social history reviewed and updated as indicated. Interim medical history since our last visit reviewed. Allergies and medications reviewed and updated. Outpatient Medications Prior to Visit  Medication Sig Dispense Refill  . alendronate (FOSAMAX) 70 MG tablet TAKE 1 TABLET (70 MG TOTAL) BY MOUTH ONCE A WEEK. 12 tablet 0  . Cholecalciferol (VITAMIN D3) 1000 units CAPS Take 1 capsule (1,000 Units total) by mouth daily. 30 capsule   . gabapentin (NEURONTIN) 600 MG tablet TAKE 1 TABLET BY MOUTH TWICE A DAY 180 tablet 1  . lovastatin (MEVACOR) 40 MG tablet Take 1 tablet (40 mg total) by mouth at bedtime. 30 tablet 11  . Multiple Vitamin (MULTIVITAMIN) tablet Take 1 tablet by mouth daily.    . naproxen sodium (ANAPROX) 220 MG tablet Take 220 mg by mouth 2 (two) times daily with a meal.     No facility-administered medications prior to visit.      Per HPI  unless specifically indicated in ROS section below Review of Systems     Objective:    BP 116/80 (BP Location: Left Arm, Patient Position: Sitting, Cuff Size: Normal)   Pulse 86   Temp 98.4 F (36.9 C) (Oral)   Ht 5\' 4"  (1.626 m)   Wt 140 lb 8 oz (63.7 kg)   SpO2 95%   BMI 24.12 kg/m   Wt Readings from Last 3 Encounters:  09/02/17 140 lb 8 oz (63.7 kg)  06/25/17 146 lb (66.2 kg)  11/03/16 147 lb 8 oz (66.9 kg)    Physical Exam  Constitutional: She appears well-developed and well-nourished. No distress.  HENT:  Mouth/Throat: Oropharynx is clear and moist. No oropharyngeal exudate.  Cardiovascular: Normal rate, regular rhythm and normal heart sounds.  No murmur heard. Pulmonary/Chest: Effort normal and breath sounds normal. No respiratory distress. She has no wheezes. She has no rales.  Musculoskeletal: She exhibits no edema.  2+ DP bilaterally  Psychiatric: She has a normal mood and affect.  Nursing note and vitals reviewed.  Results for orders placed or performed during the hospital encounter of 06/26/17  I-STAT creatinine  Result Value Ref Range   Creatinine, Ser 0.70 0.44 - 1.00 mg/dL      Assessment & Plan:  I asked her to call [facility] again to schedule screening mammogram - she  states she was told she only needed mammogram Q5 yrs.  Will sign ROI for recent colonoscopy report.  Problem List Items Addressed This Visit    Vitamin B12 deficiency    b12 level check today, then B12 shot - rec schedule monthly B12 shots for at least 6 months.       Relevant Orders   Vitamin B12   Thoracic aortic atherosclerosis ([place])    On statin.       Right-sided chest wall pain    Reassuring CT - scarring, emphysema, fibrosis. Ex smoker - quit 2006. Denies COPD respiratory symptoms. Monitor for now.       Peripheral neuropathy - Primary    Longstanding.  On gabapentin 600mg  BID.  Replete B12 then reassess.  Check further labs today including SPEP.  Anticipate idiopathic  neuropathy.       Relevant Orders   Vitamin B12   TSH   CBC with Differential/Platelet   Serum protein electrophoresis with reflex   Abnormal TSH    Update TFT      Relevant Orders   TSH   T4, free       No orders of the defined types were placed in this encounter.  Orders Placed This Encounter  Procedures  . Vitamin B12  . TSH  . T4, free  . CBC with Differential/Platelet  . Serum protein electrophoresis with reflex    Follow up plan: No follow-ups on file.  Ria Bush, MD

## 2017-09-02 NOTE — Assessment & Plan Note (Signed)
b12 level check today, then B12 shot - rec schedule monthly B12 shots for at least 6 months.

## 2017-09-02 NOTE — Assessment & Plan Note (Signed)
On statin.

## 2017-09-02 NOTE — Patient Instructions (Addendum)
Sign release for records of colonoscopy.  Labs today, then B12 shot Schedule B12 shots monthly.  Call back and schedule screening mammogram at [facility].

## 2017-09-02 NOTE — Assessment & Plan Note (Signed)
Reassuring CT - scarring, emphysema, fibrosis. Ex smoker - quit 2006. Denies COPD respiratory symptoms. Monitor for now.

## 2017-09-02 NOTE — Assessment & Plan Note (Signed)
Update TFT

## 2017-09-04 LAB — PROTEIN ELECTROPHORESIS, SERUM, WITH REFLEX
ALPHA 1: 0.3 g/dL (ref 0.2–0.3)
ALPHA 2: 0.8 g/dL (ref 0.5–0.9)
Albumin ELP: 4 g/dL (ref 3.8–4.8)
BETA 2: 0.4 g/dL (ref 0.2–0.5)
Beta Globulin: 0.5 g/dL (ref 0.4–0.6)
GAMMA GLOBULIN: 1.3 g/dL (ref 0.8–1.7)
Total Protein: 7.4 g/dL (ref 6.1–8.1)

## 2017-10-20 ENCOUNTER — Other Ambulatory Visit: Payer: Self-pay | Admitting: Family Medicine

## 2017-10-20 NOTE — Telephone Encounter (Signed)
Fosamax Last filled:  05/25/17, #12 Last OV:  09/02/17 Next OV:  none

## 2018-01-01 ENCOUNTER — Other Ambulatory Visit: Payer: Self-pay | Admitting: Family Medicine

## 2018-01-01 NOTE — Telephone Encounter (Signed)
Gabapentin Last filled:  10/22/17, #180/1 Last OV:  8/141/19, acute Next OV:  none

## 2018-01-08 ENCOUNTER — Other Ambulatory Visit: Payer: Self-pay | Admitting: Family Medicine

## 2018-06-18 ENCOUNTER — Other Ambulatory Visit: Payer: Self-pay | Admitting: Family Medicine

## 2018-06-18 NOTE — Telephone Encounter (Signed)
LOV was on 09/02/2017 and no future appointments scheduled. Does patient need follow up?

## 2018-09-09 ENCOUNTER — Other Ambulatory Visit: Payer: Self-pay | Admitting: Family Medicine

## 2018-09-10 NOTE — Telephone Encounter (Signed)
Does patient need follow up or CPE visit? LOV was on 09/02/17.

## 2019-02-18 ENCOUNTER — Other Ambulatory Visit: Payer: Self-pay | Admitting: Family Medicine

## 2019-02-18 NOTE — Telephone Encounter (Signed)
Please call patient and scheduled CPE. Last office visit 09/02/17 then send back to [facility] for approval.

## 2019-02-21 ENCOUNTER — Telehealth: Payer: Self-pay | Admitting: *Deleted

## 2019-02-21 NOTE — Telephone Encounter (Signed)
Contacted patient as requested by patient (6+ months after last contact), to discuss lung screening referral. Patient requests to contact me after discussing with PCP at upcoming visit. Confirmed that patient has my contact #.

## 2019-02-21 NOTE — Telephone Encounter (Signed)
Patient scheduled.

## 2019-02-21 NOTE — Telephone Encounter (Signed)
Dr. Darnell Level, are these ok to refill now that patient is scheduled?

## 2019-03-02 ENCOUNTER — Other Ambulatory Visit: Payer: Self-pay | Admitting: Family Medicine

## 2019-03-02 ENCOUNTER — Other Ambulatory Visit (INDEPENDENT_AMBULATORY_CARE_PROVIDER_SITE_OTHER): Payer: Federal, State, Local not specified - PPO

## 2019-03-02 ENCOUNTER — Other Ambulatory Visit: Payer: Self-pay

## 2019-03-02 DIAGNOSIS — E785 Hyperlipidemia, unspecified: Secondary | ICD-10-CM | POA: Diagnosis not present

## 2019-03-02 DIAGNOSIS — M810 Age-related osteoporosis without current pathological fracture: Secondary | ICD-10-CM | POA: Diagnosis not present

## 2019-03-02 DIAGNOSIS — R7989 Other specified abnormal findings of blood chemistry: Secondary | ICD-10-CM | POA: Diagnosis not present

## 2019-03-02 DIAGNOSIS — E538 Deficiency of other specified B group vitamins: Secondary | ICD-10-CM

## 2019-03-02 LAB — LIPID PANEL
Cholesterol: 201 mg/dL — ABNORMAL HIGH (ref 0–200)
HDL: 48.3 mg/dL (ref >39.00)
NonHDL: 152.21
Total CHOL/HDL Ratio: 4
Triglycerides: 367 mg/dL — ABNORMAL HIGH (ref 0.0–149.0)
VLDL: 73.4 mg/dL — ABNORMAL HIGH (ref 0.0–40.0)

## 2019-03-02 LAB — COMPREHENSIVE METABOLIC PANEL
ALT: 16 U/L (ref 0–35)
AST: 21 U/L (ref 0–37)
Albumin: 3.9 g/dL (ref 3.5–5.2)
Alkaline Phosphatase: 25 U/L — ABNORMAL LOW (ref 39–117)
BUN: 9 mg/dL (ref 6–23)
CO2: 30 mEq/L (ref 19–32)
Calcium: 9 mg/dL (ref 8.4–10.5)
Chloride: 105 mEq/L (ref 96–112)
Creatinine, Ser: 0.72 mg/dL (ref 0.40–1.20)
GFR: 79.03 mL/min (ref >60.00)
Glucose, Bld: 87 mg/dL (ref 70–99)
Potassium: 4.7 mEq/L (ref 3.5–5.1)
Sodium: 140 mEq/L (ref 135–145)
Total Bilirubin: 0.5 mg/dL (ref 0.2–1.2)
Total Protein: 7.3 g/dL (ref 6.0–8.3)

## 2019-03-02 LAB — VITAMIN B12: Vitamin B-12: 286 pg/mL (ref 211–911)

## 2019-03-02 LAB — VITAMIN D 25 HYDROXY (VIT D DEFICIENCY, FRACTURES): VITD: 30.95 ng/mL (ref 30.00–100.00)

## 2019-03-02 LAB — TSH: TSH: 2.67 u[IU]/mL (ref 0.35–4.50)

## 2019-03-02 LAB — LDL CHOLESTEROL, DIRECT: Direct LDL: 101 mg/dL

## 2019-03-02 LAB — T4, FREE: Free T4: 0.67 ng/dL (ref 0.60–1.60)

## 2019-03-09 ENCOUNTER — Other Ambulatory Visit: Payer: Self-pay

## 2019-03-09 ENCOUNTER — Encounter: Payer: Self-pay | Admitting: Family Medicine

## 2019-03-09 ENCOUNTER — Ambulatory Visit (INDEPENDENT_AMBULATORY_CARE_PROVIDER_SITE_OTHER): Payer: Federal, State, Local not specified - PPO | Admitting: Family Medicine

## 2019-03-09 VITALS — BP 122/80 | HR 79 | Temp 97.6°F | Ht 63.5 in | Wt 139.4 lb

## 2019-03-09 DIAGNOSIS — Z0000 Encounter for general adult medical examination without abnormal findings: Secondary | ICD-10-CM | POA: Diagnosis not present

## 2019-03-09 DIAGNOSIS — E785 Hyperlipidemia, unspecified: Secondary | ICD-10-CM | POA: Diagnosis not present

## 2019-03-09 DIAGNOSIS — E538 Deficiency of other specified B group vitamins: Secondary | ICD-10-CM

## 2019-03-09 DIAGNOSIS — J449 Chronic obstructive pulmonary disease, unspecified: Secondary | ICD-10-CM

## 2019-03-09 DIAGNOSIS — Z1211 Encounter for screening for malignant neoplasm of colon: Secondary | ICD-10-CM

## 2019-03-09 DIAGNOSIS — M810 Age-related osteoporosis without current pathological fracture: Secondary | ICD-10-CM | POA: Diagnosis not present

## 2019-03-09 DIAGNOSIS — I700 Atherosclerosis of aorta: Secondary | ICD-10-CM

## 2019-03-09 DIAGNOSIS — Z1231 Encounter for screening mammogram for malignant neoplasm of breast: Secondary | ICD-10-CM

## 2019-03-09 DIAGNOSIS — Z7189 Other specified counseling: Secondary | ICD-10-CM

## 2019-03-09 DIAGNOSIS — Z87891 Personal history of nicotine dependence: Secondary | ICD-10-CM

## 2019-03-09 DIAGNOSIS — G6289 Other specified polyneuropathies: Secondary | ICD-10-CM

## 2019-03-09 MED ORDER — CYANOCOBALAMIN 1000 MCG/ML IJ SOLN
1000.0000 ug | Freq: Once | INTRAMUSCULAR | Status: AC
  Administered 2019-03-09: 1000 ug via INTRAMUSCULAR

## 2019-03-09 MED ORDER — NORTRIPTYLINE HCL 25 MG PO CAPS: 25.0000 mg | ORAL_CAPSULE | Freq: Every day | ORAL | 11 refills | Status: DC

## 2019-03-09 NOTE — Assessment & Plan Note (Signed)
Preventative protocols reviewed and updated unless pt declined. Discussed healthy diet and lifestyle.  

## 2019-03-09 NOTE — Patient Instructions (Addendum)
B12 shot today I do think you should undergo lung cancer screening CT - call back.  I will refer you back to Dr Collene Mares to discuss repeat colonoscopy.  We will refer you for mammogram and bone density scan.  Triglycerides were too high - decrease added sugars in diet.  Continue gabapentin 600mg  every night, consider taking daily dose at lunch as long as not too sedating.  Try nortriptyline 25mg  at bedtime for sleep, nerve pain, and mood.  Return in 3-4 months for follow up visit.  Advanced directive packet provided today.   Health Maintenance After Age 23 After age 19, you are at a higher risk for certain long-term diseases and infections as well as injuries from falls. Falls are a major cause of broken bones and head injuries in people who are older than age 38. Getting regular preventive care can help to keep you healthy and well. Preventive care includes getting regular testing and making lifestyle changes as recommended by your health care provider. Talk with your health care provider about:  Which screenings and tests you should have. A screening is a test that checks for a disease when you have no symptoms.  A diet and exercise plan that is right for you. What should I know about screenings and tests to prevent falls? Screening and testing are the best ways to find a health problem early. Early diagnosis and treatment give you the best chance of managing medical conditions that are common after age 74. Certain conditions and lifestyle choices may make you more likely to have a fall. Your health care provider may recommend:  Regular vision checks. Poor vision and conditions such as cataracts can make you more likely to have a fall. If you wear glasses, make sure to get your prescription updated if your vision changes.  Medicine review. Work with your health care provider to regularly review all of the medicines you are taking, including over-the-counter medicines. Ask your health care provider  about any side effects that may make you more likely to have a fall. Tell your health care provider if any medicines that you take make you feel dizzy or sleepy.  Osteoporosis screening. Osteoporosis is a condition that causes the bones to get weaker. This can make the bones weak and cause them to break more easily.  Blood pressure screening. Blood pressure changes and medicines to control blood pressure can make you feel dizzy.  Strength and balance checks. Your health care provider may recommend certain tests to check your strength and balance while standing, walking, or changing positions.  Foot health exam. Foot pain and numbness, as well as not wearing proper footwear, can make you more likely to have a fall.  Depression screening. You may be more likely to have a fall if you have a fear of falling, feel emotionally low, or feel unable to do activities that you used to do.  Alcohol use screening. Using too much alcohol can affect your balance and may make you more likely to have a fall. What actions can I take to lower my risk of falls? General instructions  Talk with your health care provider about your risks for falling. Tell your health care provider if: ? You fall. Be sure to tell your health care provider about all falls, even ones that seem minor. ? You feel dizzy, sleepy, or off-balance.  Take over-the-counter and prescription medicines only as told by your health care provider. These include any supplements.  Eat a healthy diet and  maintain a healthy weight. A healthy diet includes low-fat dairy products, low-fat (lean) meats, and fiber from whole grains, beans, and lots of fruits and vegetables. Home safety  Remove any tripping hazards, such as rugs, cords, and clutter.  Install safety equipment such as grab bars in bathrooms and safety rails on stairs.  Keep rooms and walkways well-lit. Activity   Follow a regular exercise program to stay fit. This will help you  maintain your balance. Ask your health care provider what types of exercise are appropriate for you.  If you need a cane or walker, use it as recommended by your health care provider.  Wear supportive shoes that have nonskid soles. Lifestyle  Do not drink alcohol if your health care provider tells you not to drink.  If you drink alcohol, limit how much you have: ? 0-1 drink a day for women. ? 0-2 drinks a day for men.  Be aware of how much alcohol is in your drink. In the [place] one drink equals one typical bottle of beer (12 oz), one-half glass of wine (5 oz), or one shot of hard liquor (1 oz).  Do not use any products that contain nicotine or tobacco, such as cigarettes and e-cigarettes. If you need help quitting, ask your health care provider. Summary  Having a healthy lifestyle and getting preventive care can help to protect your health and wellness after age 106.  Screening and testing are the best way to find a health problem early and help you avoid having a fall. Early diagnosis and treatment give you the best chance for managing medical conditions that are more common for people who are older than age 47.  Falls are a major cause of broken bones and head injuries in people who are older than age 24. Take precautions to prevent a fall at home.  Work with your health care provider to learn what changes you can make to improve your health and wellness and to prevent falls. This information is not intended to replace advice given to you by your health care provider. Make sure you discuss any questions you have with your health care provider. Document Revised: 04/29/2018 Document Reviewed: 11/19/2016 Elsevier Patient Education  2020 [employer].

## 2019-03-09 NOTE — Progress Notes (Addendum)
This visit was conducted in person.  BP 122/80 (BP Location: Left Arm, Patient Position: Sitting, Cuff Size: Normal)   Pulse 79   Temp 97.6 F (36.4 C) (Temporal)   Ht 5' 3.5" (1.613 m)   Wt 139 lb 7 oz (63.2 kg)   SpO2 98%   BMI 24.31 kg/m    CC: CPE Subjective:    Patient ID: Cassie Shaw, female    DOB: [birth year], 74 y.o.   MRN: [id number]  HPI: Cassie Shaw is a 74 y.o. female presenting on 03/09/2019 for Annual Exam   Does not have medicare.  Last seen here 08/2017.   Ongoing peripheral neuropathy. Tried tens unit, lights, foot vibrator. Comes and goes, currently not bothering her. Worse at night time. She had normal nerve conduction study 2017. Manages with tylenol and gabapentin 600mg , and ibuprofen and/or goody powders. Trouble sleeping due to this. Takes gabapentin 600mg  nightly, extra sometimes. Restarted vit B12 OTC last week. Also using OTC neuropathy cream.   Regularly takes vit D 1000 IU daily.  She bought treadmill and has been walking 1 mile/day.   No exam data present    Office Visit from 03/09/2019 in [place] at [facility] Total Score  1      No flowsheet data found.    Preventative: COLONOSCOPY 12/2005 - WNL Cassie Shaw). Discussed options - would like rpt colonoscopy - referral placed.  Breast cancer screening - overdue. Declines in office exam.  Well woman exam - s/p partial hysterectomy 1975 for heavy bleeding, ovaries remain. no pelvic pain or vaginal bleeding.  Lung cancer screening - quit 2006, prior ~30 PY hx. due for lung cancer screening CT DEXA 01/2013 T -3.5 spine, -2.5 hip osteoporosis on fosamax, however doesn't remember to take regularly. due for rpt  Flu shot - yearly Td 2007 Prevnar - today.  Shingrix - discussed Advanced directive discussion - would want daughter Cassie Shaw to be HCPOA. Ok with temporary measure, doesn't want prolonged life support if terminal condition. Packet provided again today.  Seat belt use  discussed Sunscreen use discussed.  Smoking - ex smoker quit remotely  Alcohol  - none Dentist - saw this year  Eye exam - due - encouraged she schedule Bowel - chronic constipation managed with activia and miralax and laxatives PRN Bladder - no incontinence  Lives with husband Cassie Shaw), 2 dogs and a bird Occupation: retired, was Armed forces logistics/support/administrative officer  Activity: regular treadmill use - walks 1 mi/day Diet: good water, fruits/vegetables daily     Relevant past medical, surgical, family and social history reviewed and updated as indicated. Interim medical history since our last visit reviewed. Allergies and medications reviewed and updated. Outpatient Medications Prior to Visit  Medication Sig Dispense Refill  . alendronate (FOSAMAX) 70 MG tablet TAKE 1 TABLET BY MOUTH ONE TIME PER WEEK 12 tablet 1  . Cholecalciferol (VITAMIN D3) 1000 units CAPS Take 1 capsule (1,000 Units total) by mouth daily. 30 capsule   . gabapentin (NEURONTIN) 600 MG tablet TAKE 1 TABLET BY MOUTH TWICE A DAY (Patient taking differently: As needed) 180 tablet 1  . lovastatin (MEVACOR) 40 MG tablet TAKE 1 TABLET BY MOUTH EVERYDAY AT BEDTIME 90 tablet 3  . Multiple Vitamin (MULTIVITAMIN) tablet Take 1 tablet by mouth daily.    . naproxen sodium (ANAPROX) 220 MG tablet Take 220 mg by mouth 2 (two) times daily with a meal. As needed     No facility-administered medications prior to visit.  Per HPI unless specifically indicated in ROS section below Review of Systems  Constitutional: Negative for activity change, appetite change, chills, fatigue, fever and unexpected weight change.  HENT: Negative for hearing loss.   Eyes: Negative for visual disturbance.  Respiratory: Positive for cough (sinus drainage). Negative for chest tightness, shortness of breath and wheezing.   Cardiovascular: Negative for chest pain, palpitations and leg swelling.  Gastrointestinal: Positive for constipation (chronic). Negative for abdominal  distention, abdominal pain, blood in stool, diarrhea, nausea and vomiting.  Genitourinary: Negative for difficulty urinating and hematuria.  Musculoskeletal: Negative for arthralgias, myalgias and neck pain.  Skin: Negative for rash.  Neurological: Negative for dizziness, seizures, syncope and headaches.  Hematological: Negative for adenopathy. Does not bruise/bleed easily.  Psychiatric/Behavioral: Negative for dysphoric mood. The patient is not nervous/anxious.    Objective:    BP 122/80 (BP Location: Left Arm, Patient Position: Sitting, Cuff Size: Normal)   Pulse 79   Temp 97.6 F (36.4 C) (Temporal)   Ht 5' 3.5" (1.613 m)   Wt 139 lb 7 oz (63.2 kg)   SpO2 98%   BMI 24.31 kg/m   Wt Readings from Last 3 Encounters:  03/09/19 139 lb 7 oz (63.2 kg)  09/02/17 140 lb 8 oz (63.7 kg)  06/25/17 146 lb (66.2 kg)    Physical Exam Vitals and nursing note reviewed.  Constitutional:      General: She is not in acute distress.    Appearance: Normal appearance. She is well-developed. She is not ill-appearing.  HENT:     Head: Normocephalic and atraumatic.     Right Ear: Hearing, tympanic membrane, ear canal and external ear normal.     Left Ear: Hearing, tympanic membrane, ear canal and external ear normal.     Mouth/Throat:     Pharynx: Uvula midline. No posterior oropharyngeal erythema.  Eyes:     General: No scleral icterus.    Extraocular Movements: Extraocular movements intact.     Conjunctiva/sclera: Conjunctivae normal.     Pupils: Pupils are equal, round, and reactive to light.  Neck:     Thyroid: No thyromegaly or thyroid tenderness.     Vascular: No carotid bruit.  Cardiovascular:     Rate and Rhythm: Normal rate and regular rhythm.     Pulses: Normal pulses.          Radial pulses are 2+ on the right side and 2+ on the left side.     Heart sounds: Normal heart sounds. No murmur.  Pulmonary:     Effort: Pulmonary effort is normal. No respiratory distress.     Breath  sounds: Normal breath sounds. No wheezing, rhonchi or rales.  Abdominal:     General: Abdomen is flat. Bowel sounds are normal. There is no distension.     Palpations: Abdomen is soft. There is no mass.     Tenderness: There is no abdominal tenderness. There is no guarding or rebound.     Hernia: No hernia is present.  Musculoskeletal:        General: Normal range of motion.     Cervical back: Normal range of motion and neck supple.     Right lower leg: No edema.     Left lower leg: No edema.  Lymphadenopathy:     Cervical: No cervical adenopathy.  Skin:    General: Skin is warm and dry.     Findings: No rash.  Neurological:     General: No focal deficit present.  Mental Status: She is alert and oriented to person, place, and time.     Comments: CN grossly intact, station and gait intact  Psychiatric:        Mood and Affect: Mood normal.        Behavior: Behavior normal.        Thought Content: Thought content normal.        Judgment: Judgment normal.       Results for orders placed or performed in visit on 03/02/19  VITAMIN D 25 Hydroxy (Vit-D Deficiency, Fractures)  Result Value Ref Range   VITD 30.95 30.00 - 100.00 ng/mL  Vitamin B12  Result Value Ref Range   Vitamin B-12 286 211 - 911 pg/mL  T4, free  Result Value Ref Range   Free T4 0.67 0.60 - 1.60 ng/dL  TSH  Result Value Ref Range   TSH 2.67 0.35 - 4.50 uIU/mL  Comprehensive metabolic panel  Result Value Ref Range   Sodium 140 135 - 145 mEq/L   Potassium 4.7 3.5 - 5.1 mEq/L   Chloride 105 96 - 112 mEq/L   CO2 30 19 - 32 mEq/L   Glucose, Bld 87 70 - 99 mg/dL   BUN 9 6 - 23 mg/dL   Creatinine, Ser 0.72 0.40 - 1.20 mg/dL   Total Bilirubin 0.5 0.2 - 1.2 mg/dL   Alkaline Phosphatase 25 (L) 39 - 117 U/L   AST 21 0 - 37 U/L   ALT 16 0 - 35 U/L   Total Protein 7.3 6.0 - 8.3 g/dL   Albumin 3.9 3.5 - 5.2 g/dL   GFR 79.03 >60.00 mL/min   Calcium 9.0 8.4 - 10.5 mg/dL  Lipid panel  Result Value Ref Range    Cholesterol 201 (H) 0 - 200 mg/dL   Triglycerides 367.0 (H) 0.0 - 149.0 mg/dL   HDL 48.30 >39.00 mg/dL   VLDL 73.4 (H) 0.0 - 40.0 mg/dL   Total CHOL/HDL Ratio 4    NonHDL 152.21   LDL cholesterol, direct  Result Value Ref Range   Direct LDL 101.0 mg/dL   Depression screen [facility] 2/9 03/09/2019 06/25/2017  Decreased Interest 0 3  Down, Depressed, Hopeless 1 2  PHQ - 2 Score 1 5  Altered sleeping 3 3  Tired, decreased energy 1 2  Change in appetite 1 2  Feeling bad or failure about yourself  0 2  Trouble concentrating 1 3  Moving slowly or fidgety/restless 0 1  Suicidal thoughts 0 0  PHQ-9 Score 7 18    GAD 7 : Generalized Anxiety Score 03/09/2019 06/25/2017  Nervous, Anxious, on Edge 1 1  Control/stop worrying 2 2  Worry too much - different things 2 2  Trouble relaxing 1 2  Restless 0 2  Easily annoyed or irritable 3 3  Afraid - awful might happen 1 2  Total GAD 7 Score 10 14   Assessment & Plan:  This visit occurred during the SARS-CoV-2 public health emergency.  Safety protocols were in place, including screening questions prior to the visit, additional usage of staff PPE, and extensive cleaning of exam room while observing appropriate contact time as indicated for disinfecting solutions.   Problem List Items Addressed This Visit    Vitamin B12 deficiency    Levels low - has not been taking b12 - will restart 109mcg daily Will also provide vit B12 1030mcg IM.       Thoracic aortic atherosclerosis (HCC)    Mild by CT. Continue statin.  Peripheral neuropathy    Longstanding s/p overall normal NCS 09/2015.  Ongoing burning pain with paresthesias.  Continue gabapentin 600mg  BID. rec restart b12.  Will also trial nortriptyline for neuropathy and sleep.  RTC 3 mo f/u visit.       Relevant Medications   nortriptyline (PAMELOR) 25 MG capsule   Osteoporosis    Continues fosamax, sometimes forgets to take. Has been on for ~38yrs.  Update DEXA. If deteriorating, will  discuss further options.       Relevant Orders   DG Bone Density   Health maintenance examination - Primary    Preventative protocols reviewed and updated unless pt declined. Discussed healthy diet and lifestyle.       Ex-smoker    Ex smoker - due for rpt lung cancer screen - she will call.      Dyslipidemia    Tolerating lovastatin - but triglycerides remain high - rec avoid added sugars and sweetened beverages to improve readings.  The 10-year ASCVD risk score Mikey Bussing DC Brooke Bonito., et al., 2013) is: 13.3%   Values used to calculate the score:     Age: 60 years     Sex: Female     Is Non-Hispanic African American: No     Diabetic: No     Tobacco smoker: No     Systolic Blood Pressure: 546 mmHg     Is BP treated: No     HDL Cholesterol: 48.3 mg/dL     Total Cholesterol: 201 mg/dL       COPD (chronic obstructive pulmonary disease) (HCC)    Stable period off meds.       Advanced care planning/counseling discussion    Advanced directive discussion - would want daughter Cassie Shaw to be HCPOA. Ok with temporary measure, doesn't want prolonged life support if terminal condition. Packet provided again today.        Other Visit Diagnoses    Special screening for malignant neoplasms, colon       Relevant Orders   Ambulatory referral to Gastroenterology   Encounter for screening mammogram for malignant neoplasm of breast       Relevant Orders   MM 3D SCREEN BREAST BILATERAL       Meds ordered this encounter  Medications  . nortriptyline (PAMELOR) 25 MG capsule    Sig: Take 1 capsule (25 mg total) by mouth at bedtime.    Dispense:  30 capsule    Refill:  11  . cyanocobalamin ((VITAMIN B-12)) injection 1,000 mcg   Orders Placed This Encounter  Procedures  . MM 3D SCREEN BREAST BILATERAL    Standing Status:   Future    Standing Expiration Date:   05/07/2020    Order Specific Question:   Reason for Exam (SYMPTOM  OR DIAGNOSIS REQUIRED)    Answer:   breast cancer screen     Order Specific Question:   Preferred imaging location?    Answer:   [facility]  . DG Bone Density    Standing Status:   Future    Standing Expiration Date:   05/07/2020    Order Specific Question:   Reason for Exam (SYMPTOM  OR DIAGNOSIS REQUIRED)    Answer:   f/u osteoporosis    Order Specific Question:   Preferred imaging location?    Answer:   [facility]  . Ambulatory referral to Gastroenterology    Referral Priority:   Routine    Referral Type:   Consultation    Referral Reason:  Specialty Services Required    Number of Visits Requested:   1    Patient instructions: B12 shot today I do think you should undergo lung cancer screening CT - call back.  I will refer you back to Dr Cassie Shaw to discuss repeat colonoscopy.  We will refer you for mammogram and bone density scan.  Triglycerides were too high - decrease added sugars in diet.  Continue gabapentin 600mg  every night, consider taking daily dose at lunch as long as not too sedating.  Try nortriptyline 25mg  at bedtime for sleep, nerve pain, and mood.  Return in 3-4 months for follow up visit.  Advanced directive packet provided today.   Follow up plan: Return in about 4 months (around 07/07/2019) for follow up visit.  Ria Bush, MD

## 2019-03-10 NOTE — Assessment & Plan Note (Addendum)
Longstanding s/p overall normal NCS 09/2015.  Ongoing burning pain with paresthesias.  Continue gabapentin 600mg  BID. rec restart b12.  Will also trial nortriptyline for neuropathy and sleep.  RTC 3 mo f/u visit.

## 2019-03-10 NOTE — Assessment & Plan Note (Signed)
Stable period off meds.

## 2019-03-10 NOTE — Assessment & Plan Note (Signed)
Advanced directive discussion - would want daughter Cassie Shaw to be HCPOA. Ok with temporary measure, doesn't want prolonged life support if terminal condition. Packet provided again today.

## 2019-03-10 NOTE — Assessment & Plan Note (Signed)
Ex smoker - due for rpt lung cancer screen - she will call.

## 2019-03-10 NOTE — Assessment & Plan Note (Signed)
Tolerating lovastatin - but triglycerides remain high - rec avoid added sugars and sweetened beverages to improve readings.  The 10-year ASCVD risk score Mikey Bussing DC Brooke Bonito., et al., 2013) is: 13.3%   Values used to calculate the score:     Age: 74 years     Sex: Female     Is Non-Hispanic African American: No     Diabetic: No     Tobacco smoker: No     Systolic Blood Pressure: 347 mmHg     Is BP treated: No     HDL Cholesterol: 48.3 mg/dL     Total Cholesterol: 201 mg/dL

## 2019-03-10 NOTE — Assessment & Plan Note (Addendum)
Mild by CT. Continue statin.

## 2019-03-10 NOTE — Assessment & Plan Note (Signed)
Continues fosamax, sometimes forgets to take. Has been on for ~59yrs.  Update DEXA. If deteriorating, will discuss further options.

## 2019-03-10 NOTE — Assessment & Plan Note (Addendum)
Levels low - has not been taking b12 - will restart 1081mcg daily Will also provide vit B12 1046mcg IM.

## 2019-03-17 ENCOUNTER — Telehealth: Payer: Self-pay

## 2019-03-17 NOTE — Telephone Encounter (Signed)
Pt said was talking with someone at [facility] about scheduling a bone density and pt was told if Dr Danise Mina is needing a comparison from [place] done at [facility] in 2015 then pt would need to have DEXA done at [facility] but pt said she no longer goes to [facility]. I called Melissa at [facility] at [phone] and Lenna Sciara said if pt has DEXA there it would be a baseline bone density because the machines are calibrated different so cannot do comparison of previous DEXA at [facility] and DEXA done at [facility].  Melissa transferred me to [facility] at [facility] and she said if Dr Danise Mina will fax an order for Bone density to [id number] to Tracy's attention when Bone density is completed and read Olivia Mackie will mail results to Dr Danise Mina that would include comparison of 2015 bone density and the bone density pt is presently having.from the 03/09/19 visit Dr Darnell Level noted to update Dexa; if deteriorating will discuss further options which makes me think Dr Darnell Level is wanting a comparison of Dexa. Will send note to Dr Darnell Level and pt request cb after Dr Darnell Level reviews this note. If needing to speak with Olivia Mackie at [facility] call her direct line [phone].

## 2019-03-18 NOTE — Telephone Encounter (Signed)
We can send her wherever she prefers - where would she prefer?

## 2019-03-21 NOTE — Telephone Encounter (Signed)
Spoke with pt asking where she prefers to go for DEXA.  Pt says she wants to stay in [place] area and [facility] is fine with her.

## 2019-03-22 NOTE — Telephone Encounter (Addendum)
Bone density order written and in Lisa's box. plz fax to [facility].

## 2019-03-22 NOTE — Telephone Encounter (Signed)
Faxed order to Olivia Mackie of [facility], at [phone].

## 2019-03-28 ENCOUNTER — Telehealth: Payer: Self-pay | Admitting: Family Medicine

## 2019-03-28 ENCOUNTER — Encounter: Payer: Self-pay | Admitting: Family Medicine

## 2019-03-28 NOTE — Telephone Encounter (Signed)
Patient had Colonoscopy 2 years ago, she isnt dure now. Asked Dr Youlanda Mighty office to fax over the Path and Colonoscopy report. Called the patient to let her know that she is not due.

## 2019-03-28 NOTE — Telephone Encounter (Signed)
Noted. Thank you . Will await colonoscopy report.

## 2019-03-30 ENCOUNTER — Encounter: Payer: Self-pay | Admitting: Family Medicine

## 2019-03-31 ENCOUNTER — Other Ambulatory Visit: Payer: Self-pay | Admitting: Family Medicine

## 2019-04-01 ENCOUNTER — Encounter: Payer: Self-pay | Admitting: Family Medicine

## 2019-04-11 ENCOUNTER — Telehealth: Payer: Self-pay | Admitting: Family Medicine

## 2019-04-11 NOTE — Telephone Encounter (Signed)
plz notify DEXA returned showing worsening osteoporosis.  As she's been on fosamax for approx 5 yrs, would offer transition to prolia - if interested plz forward to Charmaine to price out for her.  In interim, do recommend continue weekly fosamax.

## 2019-04-11 NOTE — Telephone Encounter (Signed)
Spoke with pt relaying Dr. Synthia Innocent message.  Pt verbalizes understanding and agrees to change to Prolia.  FYI to Charmaine.

## 2019-04-15 ENCOUNTER — Telehealth: Payer: Self-pay

## 2019-04-15 DIAGNOSIS — M810 Age-related osteoporosis without current pathological fracture: Secondary | ICD-10-CM

## 2019-04-15 NOTE — Telephone Encounter (Signed)
Pt will be a "new start" for Rx Prolia.  Pt has a [employer] copy.  No prior auth for plan for Prolia.  Pt has met $55.23 of $350 deductible.  Pt can apply for a copay card thru [email or web address].  If approved, this card would have $1500 (yearly amount) to use towards purchase of Prolia (Rx only).  Pt would also be responsible for $25 admin fee.  If pt has pharmacy benefits would need policy info to check benefits.  We could send Rx to either [facility] (or specialty pharmacy if pt has benefits) for least complicated use of copay card for pt.

## 2019-04-19 MED ORDER — ALENDRONATE SODIUM 70 MG PO TABS: ORAL_TABLET | ORAL | 1 refills | Status: DC

## 2019-04-19 NOTE — Telephone Encounter (Signed)
Noted. Thanks.

## 2019-04-19 NOTE — Addendum Note (Signed)
Addended by: Ria Bush on: 04/19/2019 05:17 PM   Modules accepted: Orders

## 2019-04-19 NOTE — Telephone Encounter (Signed)
Advised pt of cost of prolia and alternative ways of paying for it. Pt would like to hold off right now and she will contact the office if she changes her mind. Pt verbalized understanding.

## 2019-05-16 ENCOUNTER — Ambulatory Visit
Admission: RE | Admit: 2019-05-16 | Discharge: 2019-05-16 | Disposition: A | Discharge status: Home / Self Care | Payer: Federal, State, Local not specified - PPO | Source: Ambulatory Visit | Attending: Family Medicine | Admitting: Family Medicine

## 2019-05-16 DIAGNOSIS — Z1231 Encounter for screening mammogram for malignant neoplasm of breast: Secondary | ICD-10-CM | POA: Insufficient documentation

## 2019-05-16 LAB — HM MAMMOGRAPHY

## 2019-05-17 ENCOUNTER — Encounter: Payer: Self-pay | Admitting: Family Medicine

## 2019-06-01 ENCOUNTER — Encounter: Payer: Self-pay | Admitting: Family Medicine

## 2019-06-01 ENCOUNTER — Ambulatory Visit: Payer: Federal, State, Local not specified - PPO | Admitting: Family Medicine

## 2019-06-01 ENCOUNTER — Other Ambulatory Visit: Payer: Self-pay

## 2019-06-01 VITALS — BP 122/84 | HR 90 | Temp 97.9°F | Ht 63.5 in | Wt 138.2 lb

## 2019-06-01 DIAGNOSIS — M810 Age-related osteoporosis without current pathological fracture: Secondary | ICD-10-CM | POA: Diagnosis not present

## 2019-06-01 DIAGNOSIS — M79671 Pain in right foot: Secondary | ICD-10-CM | POA: Diagnosis not present

## 2019-06-01 DIAGNOSIS — M79672 Pain in left foot: Secondary | ICD-10-CM | POA: Diagnosis not present

## 2019-06-01 MED ORDER — CALCIUM-VITAMIN D 600-400 MG-UNIT PO TABS: 1.0000 | ORAL_TABLET | Freq: Every day | ORAL | Status: DC

## 2019-06-01 MED ORDER — VITAMIN B-12 1000 MCG PO TABS: 1000.0000 ug | ORAL_TABLET | Freq: Every day | ORAL | Status: DC

## 2019-06-01 MED ORDER — NORTRIPTYLINE HCL 50 MG PO CAPS: 50.0000 mg | ORAL_CAPSULE | Freq: Every day | ORAL | 1 refills | Status: DC

## 2019-06-01 NOTE — Assessment & Plan Note (Signed)
prolia unaffordable. Encouraged regular fosamax use.  Discussed calcium in diet - rec she start using 1 tablet cal/vit D supplement daily.  Discussed regular weight bearing exercises DEXA not yet scanned.

## 2019-06-01 NOTE — Progress Notes (Signed)
This visit was conducted in person.  BP 122/84 (BP Location: Left Arm, Patient Position: Sitting, Cuff Size: Normal)   Pulse 90   Temp 97.9 F (36.6 C) (Temporal)   Ht 5' 3.5" (1.613 m)   Wt 138 lb 3 oz (62.7 kg)   SpO2 98%   BMI 24.09 kg/m    CC: 3 mo f/u visit  Subjective:    Patient ID: Cassie Shaw, female    DOB: 1945-05-02, 74 y.o.   MRN: [id number]  HPI: Cassie Shaw is a 74 y.o. female presenting on 06/01/2019 for Follow-up (Here for 3-4 mo f/u.)   Osteoporosis - latest DEXA showed worsening. She decided against prolia, continues weekly fosamax (on this for about 5 yrs). She tends to forget. She does walk on treadmill 1 mile/day. Also takes vitamin D regularly. She doesn't do well with calcium in the diet.   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 Tried tens unit (ReviveIt), lights, foot vibrator. Comes and goes, currently not bothering her. Worse at night time. She had normal nerve conduction study 2017. Manages with tylenol and gabapentin 600mg , and ibuprofen and/or goody powders. Trouble sleeping due to this. Takes gabapentin 600mg  nightly, extra sometimes. Also has tried multiple different OTC neuropathy creams (see scanned sheet today - lidocaine, several different herbal supplement creams without much benefit). Last visit we started nortriptyline 25mg  at bedtime - with only temporary benefit. She does hot water tub baths.  No calf pain with exertion.  Noticing increased irritability. Asks about nerve pain medication - previously on xanax.      Relevant past medical, surgical, family and social history reviewed and updated as indicated. Interim medical history since our last visit reviewed. Allergies and medications reviewed and updated. Outpatient Medications Prior to Visit  Medication Sig Dispense Refill  . alendronate (FOSAMAX) 70 MG tablet TAKE 1 TABLET BY MOUTH ONE TIME PER WEEK 12 tablet 1  . Cholecalciferol (VITAMIN D3) 1000 units CAPS Take 1 capsule  (1,000 Units total) by mouth daily. 30 capsule   . gabapentin (NEURONTIN) 600 MG tablet TAKE 1 TABLET BY MOUTH TWICE A DAY (Patient taking differently: As needed) 180 tablet 1  . lovastatin (MEVACOR) 40 MG tablet TAKE 1 TABLET BY MOUTH EVERYDAY AT BEDTIME 90 tablet 3  . Multiple Vitamin (MULTIVITAMIN) tablet Take 1 tablet by mouth daily.    . naproxen sodium (ANAPROX) 220 MG tablet Take 220 mg by mouth 2 (two) times daily with a meal. As needed    . nortriptyline (PAMELOR) 25 MG capsule TAKE 1 CAPSULE (25 MG TOTAL) BY MOUTH AT BEDTIME. 90 capsule 4   No facility-administered medications prior to visit.     Per HPI unless specifically indicated in ROS section below Review of Systems Objective:  BP 122/84 (BP Location: Left Arm, Patient Position: Sitting, Cuff Size: Normal)   Pulse 90   Temp 97.9 F (36.6 C) (Temporal)   Ht 5' 3.5" (1.613 m)   Wt 138 lb 3 oz (62.7 kg)   SpO2 98%   BMI 24.09 kg/m   Wt Readings from Last 3 Encounters:  06/01/19 138 lb 3 oz (62.7 kg)  03/09/19 139 lb 7 oz (63.2 kg)  09/02/17 140 lb 8 oz (63.7 kg)      Physical Exam Vitals and nursing note reviewed.  Constitutional:      Appearance: Normal appearance. She is not ill-appearing.  Cardiovascular:     Rate and Rhythm: Normal rate and regular rhythm.  Pulses: Normal pulses.     Heart sounds: Normal heart sounds. No murmur.  Pulmonary:     Effort: Pulmonary effort is normal. No respiratory distress.     Breath sounds: Normal breath sounds. No wheezing, rhonchi or rales.  Musculoskeletal:        General: Normal range of motion.     Right lower leg: No edema.     Left lower leg: No edema.     Comments:  2+ DP bilaterally Bilateral feet WNL No palpable cords  Neurological:     Mental Status: She is alert.  Psychiatric:        Mood and Affect: Mood normal.        Behavior: Behavior normal.       Results for orders placed or performed in visit on 05/17/19  HM MAMMOGRAPHY  Result Value Ref  Range   HM Mammogram 0-4 Bi-Rad 0-4 Bi-Rad, Self Reported Normal   Assessment & Plan:  This visit occurred during the SARS-CoV-2 public health emergency.  Safety protocols were in place, including screening questions prior to the visit, additional usage of staff PPE, and extensive cleaning of exam room while observing appropriate contact time as indicated for disinfecting solutions.   Problem List Items Addressed This Visit    Osteoporosis - Primary    prolia unaffordable. Encouraged regular fosamax use.  Discussed calcium in diet - rec she start using 1 tablet cal/vit D supplement daily.  Discussed regular weight bearing exercises DEXA not yet scanned.       Relevant Medications   Calcium Carb-Cholecalciferol (CALCIUM-VITAMIN D) 600-400 MG-UNIT TABS   Foot pain, bilateral    Longstanding burning pain of soles of feet, presumed due to peripheral neuropathy however NCS 2017 were normal. Overall benign exam with strong pedal pulses today pointing against arterial insufficiency.  Will increase nortriptyline to 50mg  nightly, continue her TENS unit at home, foot vibration, continue tylenol and gabapentin, ibuprofen.  Will trial periph neuropathy compounded cream through [facility] (Rx provided today) - she will call to price out prior to getting it filled.  Update with effect at f/u visit.           Meds ordered this encounter  Medications  . Calcium Carb-Cholecalciferol (CALCIUM-VITAMIN D) 600-400 MG-UNIT TABS    Sig: Take 1 tablet by mouth daily.  . nortriptyline (PAMELOR) 50 MG capsule    Sig: Take 1 capsule (50 mg total) by mouth at bedtime.    Dispense:  90 capsule    Refill:  1    Note new dose  . vitamin B-12 (CYANOCOBALAMIN) 1000 MCG tablet    Sig: Take 1 tablet (1,000 mcg total) by mouth daily.   No orders of the defined types were placed in this encounter.   Patient Instructions  Increase nortriptyline to 50mg  nightly, watch for worsening dry mouth. This should help  foot pain and sleep.  Let us know if any trouble with this.  Try compounded neuropathy cream from [facility] - call them to price out before you go.    Follow up plan: Return in about 3 months (around 09/01/2019), or if symptoms worsen or fail to improve, for follow up visit.  Ria Bush, MD

## 2019-06-01 NOTE — Assessment & Plan Note (Addendum)
Longstanding burning pain of soles of feet, presumed due to peripheral neuropathy however NCS 2017 were normal. Overall benign exam with strong pedal pulses today pointing against arterial insufficiency.  Will increase nortriptyline to 50mg  nightly, continue her TENS unit at home, foot vibration, continue tylenol and gabapentin, ibuprofen.  Will trial periph neuropathy compounded cream through [facility] (Rx provided today) - she will call to price out prior to getting it filled.  Update with effect at f/u visit.

## 2019-06-01 NOTE — Patient Instructions (Signed)
Increase nortriptyline to 50mg  nightly, watch for worsening dry mouth. This should help foot pain and sleep.  Let us know if any trouble with this.  Try compounded neuropathy cream from [facility] - call them to price out before you go.

## 2019-06-08 ENCOUNTER — Ambulatory Visit: Payer: Federal, State, Local not specified - PPO | Admitting: Family Medicine

## 2019-09-21 ENCOUNTER — Telehealth (INDEPENDENT_AMBULATORY_CARE_PROVIDER_SITE_OTHER): Payer: Federal, State, Local not specified - PPO | Admitting: Family Medicine

## 2019-09-21 ENCOUNTER — Other Ambulatory Visit: Payer: Self-pay | Admitting: Family Medicine

## 2019-09-21 ENCOUNTER — Other Ambulatory Visit: Payer: Federal, State, Local not specified - PPO

## 2019-09-21 DIAGNOSIS — R350 Frequency of micturition: Secondary | ICD-10-CM

## 2019-09-21 DIAGNOSIS — R300 Dysuria: Secondary | ICD-10-CM

## 2019-09-21 DIAGNOSIS — R109 Unspecified abdominal pain: Secondary | ICD-10-CM

## 2019-09-21 LAB — POC URINALSYSI DIPSTICK (AUTOMATED)
Bilirubin, UA: NEGATIVE
Blood, UA: NEGATIVE
Glucose, UA: NEGATIVE
Ketones, UA: NEGATIVE
Leukocytes, UA: NEGATIVE
Nitrite, UA: POSITIVE
Protein, UA: NEGATIVE
Spec Grav, UA: 1.015 (ref 1.010–1.025)
Urobilinogen, UA: 0.2 E.U./dL
pH, UA: 6 (ref 5.0–8.0)

## 2019-09-21 MED ORDER — SULFAMETHOXAZOLE-TRIMETHOPRIM 800-160 MG PO TABS: 1.0000 | ORAL_TABLET | Freq: Two times a day (BID) | ORAL | 0 refills | Status: DC

## 2019-09-21 NOTE — Telephone Encounter (Signed)
Pt c/o frequent urination and low abd pressure.  Started about 1 wk ago.    Collected urine sample.  Ran UA, documented results, spun and drew up for UCX- if needed.

## 2019-09-21 NOTE — Telephone Encounter (Signed)
Wife in waiting room during patient's office visit. Concerned about UTI. plz get details on symptoms and collect urine specimen.

## 2019-09-21 NOTE — Addendum Note (Signed)
Addended by: Brenton Grills on: 08/20/1884 77:37 AM   Modules accepted: Orders

## 2019-09-21 NOTE — Telephone Encounter (Signed)
See result note for details. Start bactrim, ordering uCx

## 2019-09-22 ENCOUNTER — Telehealth: Payer: Self-pay

## 2019-09-22 NOTE — Telephone Encounter (Signed)
Lvm asking pt to call back.  Need to relay results and Dr. Synthia Innocent message.  Lab results: UA suspicious for infection.  I have sent in bactrim course for her.

## 2019-09-23 LAB — URINE CULTURE
MICRO NUMBER:: 10899420
SPECIMEN QUALITY:: ADEQUATE

## 2019-09-23 NOTE — Telephone Encounter (Signed)
Lvm asking pt to call back.  Need to relay results and Dr. Synthia Innocent message.  Lab results: UA suspicious for infection.  I have sent in bactrim course for her.

## 2019-09-27 NOTE — Telephone Encounter (Signed)
Lvm asking pt to call back.  Need to relay results and Dr. Synthia Innocent message.  Mailing a letter.   Lab results: UA suspicious for infection.  I have sent in bactrim course for her.

## 2019-10-12 ENCOUNTER — Encounter: Payer: Self-pay | Admitting: *Deleted

## 2019-10-20 ENCOUNTER — Telehealth: Payer: Self-pay | Admitting: *Deleted

## 2019-10-20 DIAGNOSIS — Z122 Encounter for screening for malignant neoplasm of respiratory organs: Secondary | ICD-10-CM

## 2019-10-20 DIAGNOSIS — Z87891 Personal history of nicotine dependence: Secondary | ICD-10-CM

## 2019-10-20 NOTE — Telephone Encounter (Signed)
Received referral for initial lung cancer screening scan. Contacted patient and obtained smoking history,(former, quit 2006, 46 pack year) as well as answering questions related to screening process. Patient denies signs of lung cancer such as weight loss or hemoptysis. Patient denies comorbidity that would prevent curative treatment if lung cancer were found. Patient is scheduled for shared decision making visit and CT scan on 11/16/19 at 1030am.

## 2019-11-11 ENCOUNTER — Inpatient Hospital Stay: Payer: Federal, State, Local not specified - PPO | Attending: Nurse Practitioner | Admitting: Oncology

## 2019-11-11 ENCOUNTER — Other Ambulatory Visit: Payer: Self-pay

## 2019-11-11 ENCOUNTER — Ambulatory Visit
Admission: RE | Admit: 2019-11-11 | Discharge: 2019-11-11 | Disposition: A | Discharge status: Home / Self Care | Payer: Federal, State, Local not specified - PPO | Source: Ambulatory Visit | Attending: Oncology | Admitting: Oncology

## 2019-11-11 ENCOUNTER — Encounter: Payer: Self-pay | Admitting: Nurse Practitioner

## 2019-11-11 DIAGNOSIS — Z87891 Personal history of nicotine dependence: Secondary | ICD-10-CM | POA: Diagnosis not present

## 2019-11-11 DIAGNOSIS — Z122 Encounter for screening for malignant neoplasm of respiratory organs: Secondary | ICD-10-CM | POA: Insufficient documentation

## 2019-11-11 NOTE — Progress Notes (Signed)
Virtual Visit via Video Note  I connected with Cassie Shaw on 11/11/19 at 10:00 AM EDT by a video enabled telemedicine application and verified that I am speaking with the correct person using two identifiers.  Location: Patient: [facility] Provider: Clinic    I discussed the limitations of evaluation and management by telemedicine and the availability of in person appointments. The patient expressed understanding and agreed to proceed.  I discussed the assessment and treatment plan with the patient. The patient was provided an opportunity to ask questions and all were answered. The patient agreed with the plan and demonstrated an understanding of the instructions.   The patient was advised to call back or seek an in-person evaluation if the symptoms worsen or if the condition fails to improve as anticipated.   In accordance with CMS guidelines, patient has met eligibility criteria including age, absence of signs or symptoms of lung cancer.  Social History   Tobacco Use  . Smoking status: Former Smoker    Packs/day: 1.00    Years: 46.00    Pack years: 46.00    Types: Cigarettes    Quit date: 01/21/2004    Years since quitting: 15.8  . Smokeless tobacco: Never Used  Substance Use Topics  . Alcohol use: No    Alcohol/week: 0.0 standard drinks  . Drug use: No      A shared decision-making session was conducted prior to the performance of CT scan. This includes one or more decision aids, includes benefits and harms of screening, follow-up diagnostic testing, over-diagnosis, false positive rate, and total radiation exposure.   Counseling on the importance of adherence to annual lung cancer LDCT screening, impact of co-morbidities, and ability or willingness to undergo diagnosis and treatment is imperative for compliance of the program.   Counseling on the importance of continued smoking cessation for former smokers; the importance of smoking cessation for current smokers, and information about  tobacco cessation interventions have been given to patient including [place] and 1800 quit [place] programs.   Written order for lung cancer screening with LDCT has been given to the patient and any and all questions have been answered to the best of my abilities.    Yearly follow up will be coordinated by Burgess Estelle, Thoracic Navigator.  I provided 15 minutes of face-to-face video visit time during this encounter, and > 50% was spent counseling as documented under my assessment & plan.   Jacquelin Hawking, NP

## 2019-11-14 NOTE — Progress Notes (Signed)
Bridgett Larsson, NP 11/14/2019 4:20 PM

## 2019-11-16 ENCOUNTER — Telehealth: Payer: Federal, State, Local not specified - PPO | Admitting: Oncology

## 2019-11-16 ENCOUNTER — Telehealth: Payer: Self-pay | Admitting: *Deleted

## 2019-11-16 NOTE — Telephone Encounter (Signed)
Voicemail left for patient to call back to review lung screening scan results.

## 2019-11-17 ENCOUNTER — Telehealth: Payer: Self-pay | Admitting: *Deleted

## 2019-11-17 NOTE — Telephone Encounter (Signed)
Notified patient of LDCT lung cancer screening program results with recommendation for 3 month follow up imaging. Also notified of incidental findings noted below and is encouraged to discuss further with PCP who will receive a copy of this note and/or the CT report. Patient verbalizes understanding.   IMPRESSION: 1. Lung-RADS 4A, suspicious. Follow up low-dose chest CT without contrast in 3 months (please use the following order, "CT CHEST LCS NODULE FOLLOW-UP /O CM") is recommended.  Aortic Atherosclerosis (ICD10-I70.0) and Emphysema (ICD10-J43.9).

## 2020-01-03 ENCOUNTER — Other Ambulatory Visit: Payer: Self-pay | Admitting: Family Medicine

## 2020-01-03 NOTE — Telephone Encounter (Signed)
E-scribed refill.  Plz schedule wellness, lab and cpe visits.  

## 2020-02-06 ENCOUNTER — Other Ambulatory Visit: Payer: Self-pay | Admitting: *Deleted

## 2020-02-06 DIAGNOSIS — R918 Other nonspecific abnormal finding of lung field: Secondary | ICD-10-CM

## 2020-02-06 DIAGNOSIS — Z87891 Personal history of nicotine dependence: Secondary | ICD-10-CM

## 2020-02-06 NOTE — Progress Notes (Signed)
Contacted and scheduled for LCS nodule follow up scan 

## 2020-02-16 ENCOUNTER — Other Ambulatory Visit: Payer: Self-pay | Admitting: Family Medicine

## 2020-02-16 ENCOUNTER — Other Ambulatory Visit: Payer: Self-pay

## 2020-02-16 ENCOUNTER — Ambulatory Visit
Admission: RE | Admit: 2020-02-16 | Discharge: 2020-02-16 | Disposition: A | Discharge status: Home / Self Care | Payer: Federal, State, Local not specified - PPO | Source: Ambulatory Visit | Attending: Oncology | Admitting: Oncology

## 2020-02-16 ENCOUNTER — Encounter: Payer: Self-pay | Admitting: *Deleted

## 2020-02-16 DIAGNOSIS — Z87891 Personal history of nicotine dependence: Secondary | ICD-10-CM | POA: Diagnosis not present

## 2020-02-16 DIAGNOSIS — R918 Other nonspecific abnormal finding of lung field: Secondary | ICD-10-CM | POA: Insufficient documentation

## 2020-02-16 NOTE — Progress Notes (Signed)
Recommend PET/CT.  Lesion is not amenable to diagnosis by navigational bronchoscopy, may also not be amenable to biopsy by IR.  May require thoracic biopsy versus SBRT if FDG avid on PET/CT.  Would also recommend PFTs if not already done as these may guide course of action.  Pulmonary consultation recommended for follow-up further guidance with regards to appropriate management.

## 2020-02-16 NOTE — Progress Notes (Signed)
Noted recent CT scan recommendations. Sending to Dr. Vernard Gambles for pulmonary opinion.

## 2020-02-16 NOTE — Progress Notes (Signed)
FYI

## 2020-02-20 ENCOUNTER — Telehealth: Payer: Self-pay | Admitting: *Deleted

## 2020-02-20 ENCOUNTER — Other Ambulatory Visit: Payer: Self-pay | Admitting: *Deleted

## 2020-02-20 DIAGNOSIS — R911 Solitary pulmonary nodule: Secondary | ICD-10-CM

## 2020-02-20 NOTE — Telephone Encounter (Signed)
Contacted and reviewed results of recent CT scan of chest including recommendation from pulmonary noted below. Patient is agreeable to this plan and will await appointments.   "Recommend PET/CT.  Lesion is not amenable to diagnosis by navigational bronchoscopy, may also not be amenable to biopsy by IR.  May require thoracic biopsy versus SBRT if FDG avid on PET/CT.  Would also recommend PFTs if not already done as these may guide course of action.  Pulmonary consultation recommended for follow-up further guidance with regards to appropriate management."

## 2020-02-20 NOTE — Progress Notes (Signed)
covid test prior to PFT's

## 2020-02-20 NOTE — Telephone Encounter (Signed)
All appointments reviewed with pt's husband over the phone. Contact info given and instructed to call back with any further questions or needs.

## 2020-02-21 HISTORY — PX: VIDEO ASSISTED THORACOSCOPY (VATS)/WEDGE RESECTION: SHX6174

## 2020-02-27 ENCOUNTER — Other Ambulatory Visit: Payer: Self-pay | Admitting: *Deleted

## 2020-02-27 ENCOUNTER — Ambulatory Visit
Admission: RE | Admit: 2020-02-27 | Discharge: 2020-02-27 | Disposition: A | Discharge status: Home / Self Care | Payer: Federal, State, Local not specified - PPO | Source: Ambulatory Visit | Attending: Oncology | Admitting: Oncology

## 2020-02-27 ENCOUNTER — Other Ambulatory Visit: Payer: Self-pay

## 2020-02-27 ENCOUNTER — Other Ambulatory Visit
Admission: RE | Admit: 2020-02-27 | Discharge: 2020-02-27 | Disposition: A | Discharge status: Home / Self Care | Payer: Federal, State, Local not specified - PPO | Source: Ambulatory Visit | Attending: Pulmonary Disease | Admitting: Pulmonary Disease

## 2020-02-27 DIAGNOSIS — R911 Solitary pulmonary nodule: Secondary | ICD-10-CM | POA: Insufficient documentation

## 2020-02-27 DIAGNOSIS — Z20822 Contact with and (suspected) exposure to covid-19: Secondary | ICD-10-CM | POA: Insufficient documentation

## 2020-02-27 DIAGNOSIS — Z01812 Encounter for preprocedural laboratory examination: Secondary | ICD-10-CM | POA: Insufficient documentation

## 2020-02-27 LAB — GLUCOSE, CAPILLARY: Glucose-Capillary: 87 mg/dL (ref 70–99)

## 2020-02-27 MED ORDER — FLUDEOXYGLUCOSE F - 18 (FDG) INJECTION
7.2000 | Freq: Once | INTRAVENOUS | Status: AC | PRN
  Administered 2020-02-27: 7.84 via INTRAVENOUS

## 2020-02-28 ENCOUNTER — Other Ambulatory Visit: Payer: Self-pay

## 2020-02-28 ENCOUNTER — Ambulatory Visit: Payer: Federal, State, Local not specified - PPO | Attending: Oncology

## 2020-02-28 DIAGNOSIS — Z87891 Personal history of nicotine dependence: Secondary | ICD-10-CM | POA: Insufficient documentation

## 2020-02-28 DIAGNOSIS — R911 Solitary pulmonary nodule: Secondary | ICD-10-CM | POA: Diagnosis not present

## 2020-02-28 LAB — SARS CORONAVIRUS 2 (TAT 6-24 HRS): SARS Coronavirus 2: NEGATIVE

## 2020-02-28 MED ORDER — ALBUTEROL SULFATE (2.5 MG/3ML) 0.083% IN NEBU
2.5000 mg | INHALATION_SOLUTION | Freq: Once | RESPIRATORY_TRACT | Status: AC
  Administered 2020-02-28: 2.5 mg via RESPIRATORY_TRACT
  Filled 2020-02-28: qty 3

## 2020-03-01 ENCOUNTER — Ambulatory Visit: Payer: Federal, State, Local not specified - PPO | Admitting: Pulmonary Disease

## 2020-03-01 ENCOUNTER — Other Ambulatory Visit: Payer: Federal, State, Local not specified - PPO

## 2020-03-01 ENCOUNTER — Encounter: Payer: Self-pay | Admitting: Pulmonary Disease

## 2020-03-01 ENCOUNTER — Other Ambulatory Visit: Payer: Self-pay

## 2020-03-01 VITALS — BP 124/76 | HR 85 | Temp 97.5°F | Ht 63.5 in | Wt 136.8 lb

## 2020-03-01 DIAGNOSIS — R911 Solitary pulmonary nodule: Secondary | ICD-10-CM

## 2020-03-01 DIAGNOSIS — J449 Chronic obstructive pulmonary disease, unspecified: Secondary | ICD-10-CM | POA: Diagnosis not present

## 2020-03-01 MED ORDER — ANORO ELLIPTA 62.5-25 MCG/INH IN AEPB: 1.0000 | INHALATION_SPRAY | Freq: Every day | RESPIRATORY_TRACT | 0 refills | Status: AC

## 2020-03-01 NOTE — Progress Notes (Signed)
Subjective:    Patient ID: Cassie Shaw, female    DOB: 03/24/1945, 75 y.o.   MRN: [id number]  HPI Cassie Shaw is a 75 year old former smoker (quit 2006, 46-pack-year history) who presents for evaluation of an abnormality noted on lung cancer screening CT.  She is kindly referred by the lung cancer screening navigator.  Her primary care physician is Dr. Ria Bush.  She mixed attenuation lesion on the peripheral left lower lobe with an associated bulla.  This lesion had a 3 mm increase size between October 2021 to January 2022.  A PET/CT showed that there was only mild metabolic activity associated with the lesion and therefore makes this lesion indeterminate.  The lesion is not amenable for diagnosis by navigational bronchoscopy and she was discussed at Tumor Board today and the recommendation was for active surveillance.  The patient presents today with her husband however, they are actually anxious for a definitive diagnosis.  She has not had any symptoms associated with this lesion.  As noted above she has a 46-pack-year history of smoking.  She has noted some mild dyspnea on exertion but for the most part this is not a big issue.  She does note some cough productive of clear mucus on a daily basis.  No hemoptysis.  Not had any fevers, chills or sweats.  No orthopnea or paroxysmal nocturnal dyspnea.  No chest pain.  No calf tenderness, no lower extremity edema.  She has no significant occupational history.  She had pulmonary function testing performed on 28 February 2020 which are as follows: FEV1 1.98 L or 98% predicted, FVC of 2.85 L or 105% predicted.  FEV1/FVC 70% predicted, no bronchodilator response.  Lung volumes were normal.  Flow volume loop was minimally delayed.  Diffusion capacity minimally reduced.  This is consistent with emphysema with preserved FEV1.  Review of Systems A 10 point review of systems was performed and it is as noted above otherwise negative.  Past Medical  History:  Diagnosis Date  . Arthritis   . B12 deficiency   . Ex-smoker   . Fibrocystic breast disease   . Fibromyalgia   . History of chicken pox   . History of depression    and anxiety  . HLD (hyperlipidemia)   . IBS (irritable bowel syndrome)   . Osteoporosis 2015   on/off fosamax DEXA T -3.5 spine, -2.5 hip  . Peripheral neuropathy   . Retinal detachment    Past Surgical History:  Procedure Laterality Date  . CATARACT EXTRACTION Bilateral 2014   with lens implant  . COLONOSCOPY  12/2005   WNL ([facility])  . COLONOSCOPY  08/2017   5 TAs, rpt 5 yrs ([facility])  . DEXA  01/2013   T -3.5 spine, -2.5 hip  . PARTIAL HYSTERECTOMY  1975   for heavy bleeding, ovaries remained  . RETINAL DETACHMENT SURGERY  2014   Dr Zadie Rhine   Family History  Problem Relation Age of Onset  . Arthritis Mother   . Cancer Mother        smoker  . Cancer Sister        bone  . Diabetes Maternal Uncle   . Cancer Maternal Grandfather   . Stroke Neg Hx   . CAD Neg Hx   . Breast cancer Neg Hx    . Social History   Tobacco Use  . Smoking status: Former Smoker    Packs/day: 1.00    Years: 46.00    Pack years: 46.00  Types: Cigarettes    Quit date: 01/21/2004    Years since quitting: 16.1  . Smokeless tobacco: Never Used  Substance Use Topics  . Alcohol use: No    Alcohol/week: 0.0 standard drinks   No Known Allergies  Current Meds  Medication Sig  . alendronate (FOSAMAX) 70 MG tablet TAKE 1 TABLET BY MOUTH ONE TIME PER WEEK  . Calcium Carb-Cholecalciferol (CALCIUM-VITAMIN D) 600-400 MG-UNIT TABS Take 1 tablet by mouth daily.  . Cholecalciferol (VITAMIN D3) 1000 units CAPS Take 1 capsule (1,000 Units total) by mouth daily.  Marland Kitchen gabapentin (NEURONTIN) 600 MG tablet TAKE 1 TABLET BY MOUTH TWICE A DAY (Patient taking differently: As needed)  . lovastatin (MEVACOR) 40 MG tablet TAKE 1 TABLET BY MOUTH EVERYDAY AT BEDTIME  . Multiple Vitamin (MULTIVITAMIN) tablet Take 1 tablet by mouth daily.  .  naproxen sodium (ANAPROX) 220 MG tablet Take 220 mg by mouth 2 (two) times daily with a meal. As needed  . vitamin B-12 (CYANOCOBALAMIN) 1000 MCG tablet Take 1 tablet (1,000 mcg total) by mouth daily.  . [DISCONTINUED] sulfamethoxazole-trimethoprim (BACTRIM DS) 800-160 MG tablet Take 1 tablet by mouth 2 (two) times daily.   Immunization History  Administered Date(s) Administered  . Influenza, High Dose Seasonal PF 09/14/2017, 09/08/2018, 10/07/2019  . Influenza,inj,Quad PF,6+ Mos 10/01/2015  . Influenza-Unspecified 10/04/2014, 10/21/2016  . PFIZER(Purple Top)SARS-COV-2 Vaccination 03/07/2019, 03/28/2019, 10/25/2019  . Pneumococcal Conjugate-13 06/25/2017  . Td 01/20/2005  . Zoster Recombinat (Shingrix) 07/13/2017, 09/14/2017      Objective:   Physical Exam BP 124/76 (BP Location: Left Arm, Cuff Size: Normal)   Pulse 85   Temp (!) 97.5 F (36.4 C) (Temporal)   Ht 5' 3.5" (1.613 m)   Wt 136 lb 12.8 oz (62.1 kg)   SpO2 99%   BMI 23.85 kg/m   GENERAL: Well-developed, well-nourished woman in no acute distress.  Ambulatory.  No conversational dyspnea. HEAD: Normocephalic, atraumatic.  EYES: Pupils equal, round, reactive to light.  No scleral icterus.  MOUTH: Masking requirements. NECK: Supple. No thyromegaly. Trachea midline. No JVD.  No adenopathy. PULMONARY: Good air entry bilaterally.  Coarse breath sounds otherwise no adventitious sounds. CARDIOVASCULAR: S1 and S2. Regular rate and rhythm.  ABDOMEN: Benign. MUSCULOSKELETAL: No joint deformity, no clubbing, no edema.  NEUROLOGIC: Focal deficit, speech is fluent, no gait disturbance. SKIN: Intact,warm,dry. PSYCH: Mood and behavior normal.  Representative slices of CT scans and PET CTs performed, reviewed:   Film from November 11, 2019:   Film from 16 February 2020:   CT from 27 February 2020:   Of note the lesion was not clearly present on a chest CT performed 26 June 2017 which is the only CT image prior to these findings,  shows some pleural thickening on this area but no cystic component or definitive mass effect.    Assessment & Plan:     ICD-10-CM   1. 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 umeclidinium-vilanterol (ANORO ELLIPTA) 62.5-25 MCG/INH AEPB    Sig: Inhale 1 puff into the lungs daily for 1 day.    Dispense:  14 each    Refill:  0    Order Specific Question:   Lot Number?    Answer:  [id number]    Order Specific Question:   Expiration Date?    Answer:   11/20/2020    Order Specific Question:   Manufacturer?    Answer:   [employer] [[id number]]    Order Specific Question:   Quantity    Answer:   2    Discussion:  Patient has a mixed nodule which has solid, groundglass and cystic components.  This has mild avidity on PET/CT.  The nodule at present is indeterminate.  However it is concerning for potential slow-growing adenocarcinoma.  Active surveillance would be 1 strategy to monitor this over time.  The lesion is not amenable to diagnosis by navigational bronchoscopy and per IR very difficult for needle biopsy due to cystic component and size of the lesion.  She would be a very high risk for pneumothorax.  Her lung function appears to be well-preserved.  I will confirm with thoracic surgery to see about the possibility for robotic excision of the lesion.  She has very minimal obstructive defect on PFTs and has daily symptoms of cough and mucus production.  We will give her a trial of Anoro Ellipta 1 inhalation daily.  We will see her in follow-up in 2 months time she is to contact us prior to that time should any new difficulties arise.  Renold Don, MD [facility]   *This note was dictated using voice recognition software/Dragon.  Despite best efforts to  proofread, errors can occur which can change the meaning.  Any change was purely unintentional.

## 2020-03-01 NOTE — Progress Notes (Signed)
Tumor Board Documentation  CAROLLE Shaw was presented by Army Chaco, RN at our Tumor Board on 03/01/2020, which included representatives from medical oncology,radiation oncology,surgical,radiology,pathology,navigation,internal medicine,pharmacy,genetics,research,palliative care,pulmonology.  Cassie Shaw currently presents as a current patient,for discussion with history of the following treatments: active survellience.  Additionally, we reviewed previous medical and familial history, history of present illness, and recent lab results along with all available histopathologic and imaging studies. The tumor board considered available treatment options and made the following recommendations: Active surveillance    The following procedures/referrals were also placed: No orders of the defined types were placed in this encounter.   Clinical Trial Status: not discussed   Staging used: Not Applicable  National site-specific guidelines   were discussed with respect to the case.  Tumor board is a meeting of clinicians from various specialty areas who evaluate and discuss patients for whom a multidisciplinary approach is being considered. Final determinations in the plan of care are those of the provider(s). The responsibility for follow up of recommendations given during tumor board is that of the provider.   Todays extended care, comprehensive team conference, [place] was not present for the discussion and was not examined.   Multidisciplinary Tumor Board is a multidisciplinary case peer review process.  Decisions discussed in the Multidisciplinary Tumor Board reflect the opinions of the specialists present at the conference without having examined the patient.  Ultimately, treatment and diagnostic decisions rest with the primary provider(s) and the patient.

## 2020-03-01 NOTE — Patient Instructions (Signed)
I will discuss your case with Dr. Roxan Hockey in [place].  I will call you with his recommendations.  I am giving you a trial of Anoro Ellipta, this is an inhaler, 1 puff daily to see if it helps with your excessive mucus and cough.  We will see you in follow-up in 2 months time call sooner should any new problems arise.

## 2020-03-02 ENCOUNTER — Telehealth: Payer: Self-pay | Admitting: Pulmonary Disease

## 2020-03-02 LAB — PULMONARY FUNCTION TEST ARMC ONLY
DL/VA % pred: 72 %
DL/VA: 3 ml/min/mmHg/L
DLCO unc % pred: 72 %
DLCO unc: 13.31 ml/min/mmHg
FEF 25-75 Post: 0.99 L/sec
FEF 25-75 Pre: 1.34 L/sec
FEF2575-%Change-Post: -26 %
FEF2575-%Pred-Post: 61 %
FEF2575-%Pred-Pre: 83 %
FEV1-%Change-Post: -4 %
FEV1-%Pred-Post: 93 %
FEV1-%Pred-Pre: 98 %
FEV1-Post: 1.88 L
FEV1-Pre: 1.98 L
FEV1FVC-%Change-Post: 6 %
FEV1FVC-%Pred-Pre: 92 %
FEV6-%Change-Post: -10 %
FEV6-%Pred-Post: 99 %
FEV6-%Pred-Pre: 110 %
FEV6-Post: 2.55 L
FEV6-Pre: 2.84 L
FEV6FVC-%Change-Post: 0 %
FEV6FVC-%Pred-Post: 105 %
FEV6FVC-%Pred-Pre: 104 %
FVC-%Change-Post: -10 %
FVC-%Pred-Post: 94 %
FVC-%Pred-Pre: 105 %
FVC-Post: 2.55 L
FVC-Pre: 2.85 L
Post FEV1/FVC ratio: 74 %
Post FEV6/FVC ratio: 100 %
Pre FEV1/FVC ratio: 70 %
Pre FEV6/FVC Ratio: 100 %
RV % pred: 103 %
RV: 2.32 L
TLC % pred: 99 %
TLC: 4.9 L

## 2020-03-02 NOTE — Telephone Encounter (Signed)
Per Dr. Patsey Berthold verbally- place referral to Dr. Blase Mess. Referral has been placed.  Patient is aware and voiced her understanding.  Nothing further needed.

## 2020-03-02 NOTE — Addendum Note (Signed)
Addended by: Claudette Head A on: 03/02/2020 08:54 AM   Modules accepted: Orders

## 2020-03-04 ENCOUNTER — Other Ambulatory Visit: Payer: Self-pay | Admitting: Family Medicine

## 2020-03-04 DIAGNOSIS — R7989 Other specified abnormal findings of blood chemistry: Secondary | ICD-10-CM

## 2020-03-04 DIAGNOSIS — E538 Deficiency of other specified B group vitamins: Secondary | ICD-10-CM

## 2020-03-04 DIAGNOSIS — M810 Age-related osteoporosis without current pathological fracture: Secondary | ICD-10-CM

## 2020-03-04 DIAGNOSIS — E785 Hyperlipidemia, unspecified: Secondary | ICD-10-CM

## 2020-03-06 ENCOUNTER — Ambulatory Visit: Payer: Federal, State, Local not specified - PPO

## 2020-03-07 ENCOUNTER — Encounter: Payer: Self-pay | Admitting: Thoracic Surgery (Cardiothoracic Vascular Surgery)

## 2020-03-07 ENCOUNTER — Other Ambulatory Visit: Payer: Self-pay | Admitting: *Deleted

## 2020-03-07 ENCOUNTER — Encounter: Payer: Self-pay | Admitting: *Deleted

## 2020-03-07 ENCOUNTER — Other Ambulatory Visit (INDEPENDENT_AMBULATORY_CARE_PROVIDER_SITE_OTHER): Payer: Federal, State, Local not specified - PPO

## 2020-03-07 ENCOUNTER — Other Ambulatory Visit: Payer: Self-pay

## 2020-03-07 ENCOUNTER — Institutional Professional Consult (permissible substitution): Payer: Federal, State, Local not specified - PPO | Admitting: Thoracic Surgery (Cardiothoracic Vascular Surgery)

## 2020-03-07 VITALS — BP 116/77 | HR 100 | Resp 20 | Ht 63.5 in | Wt 135.0 lb

## 2020-03-07 DIAGNOSIS — R911 Solitary pulmonary nodule: Secondary | ICD-10-CM

## 2020-03-07 DIAGNOSIS — R7989 Other specified abnormal findings of blood chemistry: Secondary | ICD-10-CM | POA: Diagnosis not present

## 2020-03-07 DIAGNOSIS — E785 Hyperlipidemia, unspecified: Secondary | ICD-10-CM | POA: Diagnosis not present

## 2020-03-07 DIAGNOSIS — M810 Age-related osteoporosis without current pathological fracture: Secondary | ICD-10-CM | POA: Diagnosis not present

## 2020-03-07 DIAGNOSIS — E538 Deficiency of other specified B group vitamins: Secondary | ICD-10-CM

## 2020-03-07 LAB — COMPREHENSIVE METABOLIC PANEL
ALT: 15 U/L (ref 0–35)
AST: 21 U/L (ref 0–37)
Albumin: 4.4 g/dL (ref 3.5–5.2)
Alkaline Phosphatase: 24 U/L — ABNORMAL LOW (ref 39–117)
BUN: 11 mg/dL (ref 6–23)
CO2: 30 mEq/L (ref 19–32)
Calcium: 9.7 mg/dL (ref 8.4–10.5)
Chloride: 102 mEq/L (ref 96–112)
Creatinine, Ser: 0.77 mg/dL (ref 0.40–1.20)
GFR: 75.31 mL/min (ref >60.00)
Glucose, Bld: 100 mg/dL — ABNORMAL HIGH (ref 70–99)
Potassium: 4.6 mEq/L (ref 3.5–5.1)
Sodium: 141 mEq/L (ref 135–145)
Total Bilirubin: 0.7 mg/dL (ref 0.2–1.2)
Total Protein: 8.1 g/dL (ref 6.0–8.3)

## 2020-03-07 LAB — LIPID PANEL
Cholesterol: 222 mg/dL — ABNORMAL HIGH (ref 0–200)
HDL: 69.3 mg/dL (ref >39.00)
NonHDL: 152.54
Total CHOL/HDL Ratio: 3
Triglycerides: 245 mg/dL — ABNORMAL HIGH (ref 0.0–149.0)
VLDL: 49 mg/dL — ABNORMAL HIGH (ref 0.0–40.0)

## 2020-03-07 LAB — T4, FREE: Free T4: 0.71 ng/dL (ref 0.60–1.60)

## 2020-03-07 LAB — LDL CHOLESTEROL, DIRECT: Direct LDL: 124 mg/dL

## 2020-03-07 LAB — VITAMIN D 25 HYDROXY (VIT D DEFICIENCY, FRACTURES): VITD: 57.56 ng/mL (ref 30.00–100.00)

## 2020-03-07 LAB — TSH: TSH: 2.79 u[IU]/mL (ref 0.35–4.50)

## 2020-03-07 LAB — VITAMIN B12: Vitamin B-12: >1506 pg/mL — ABNORMAL HIGH (ref 211–911)

## 2020-03-07 NOTE — Progress Notes (Signed)
PCP is Ria Bush, MD Referring Provider is Ria Bush, MD  Chief Complaint  Patient presents with  . Lung Lesion    Surgical consult, PET Scan 02/27/20, PFT's 02/28/20, Chest CT 11/11/19    HPI: Cassie Shaw is sent for consultation regarding a cystic left lower lobe lung nodule.  Cassie Shaw is a 75 year old woman with a history of tobacco abuse (quit 2006), peripheral neuropathy, hyperlipidemia, B12 deficiency, fibromyalgia, osteoporosis, and irritable bowel syndrome.  She had a low-dose CT for lung cancer screening in October.  It showed evidence of paraseptal and centrilobular emphysema and a left lower lobe lung nodule that had cystic, groundglass, and solid components.  The equivalent diameter was 11.3 mm in the central solid component measured 6.6 mm.  A follow-up CT at 3 months was recommended.  The nodule was persistent and measured at equivalent diameter of 15.6 mm with a solid component of 9.8 mm.  A PET/CT showed mild uptake with an SUV of 2.4.  She has a 46-pack-year history of smoking prior to quitting in 2006.  She complains of a cough, usually dry but occasionally productive of clear mucus.  No hemoptysis.  Occasional wheezing.  She was given a trial inhaler but has not noticed any change with that.  Her primary complaint is pain in her feet from neuropathy.  She does take gabapentin for that.  She denies any change in appetite or weight loss.  She denies headaches or visual changes.  Zubrod Score: At the time of surgery this patient's most appropriate activity status/level should be described as: []     0    Normal activity, no symptoms [x]     1    Restricted in physical strenuous activity but ambulatory, able to do out light work []     2    Ambulatory and capable of self care, unable to do work activities, up and about >50 % of waking hours                              []     3    Only limited self care, in bed greater than 50% of waking hours []     4    Completely  disabled, no self care, confined to bed or chair []     5    Moribund  Past Medical History:  Diagnosis Date  . Arthritis   . B12 deficiency   . Ex-smoker   . Fibrocystic breast disease   . Fibromyalgia   . History of chicken pox   . History of depression    and anxiety  . HLD (hyperlipidemia)   . IBS (irritable bowel syndrome)   . Osteoporosis 2015   on/off fosamax DEXA T -3.5 spine, -2.5 hip  . Peripheral neuropathy   . Retinal detachment     Past Surgical History:  Procedure Laterality Date  . CATARACT EXTRACTION Bilateral 2014   with lens implant  . COLONOSCOPY  12/2005   WNL ([facility])  . COLONOSCOPY  08/2017   5 TAs, rpt 5 yrs ([facility])  . DEXA  01/2013   T -3.5 spine, -2.5 hip  . PARTIAL HYSTERECTOMY  1975   for heavy bleeding, ovaries remained  . RETINAL DETACHMENT SURGERY  2014   Dr Zadie Rhine    Family History  Problem Relation Age of Onset  . Arthritis Mother   . Cancer Mother        smoker  . Cancer  Sister        bone  . Diabetes Maternal Uncle   . Cancer Maternal Grandfather   . Stroke Neg Hx   . CAD Neg Hx   . Breast cancer Neg Hx     Social History Social History   Tobacco Use  . Smoking status: Former Smoker    Packs/day: 1.00    Years: 46.00    Pack years: 46.00    Types: Cigarettes    Quit date: 01/21/2004    Years since quitting: 16.1  . Smokeless tobacco: Never Used  Substance Use Topics  . Alcohol use: No    Alcohol/week: 0.0 standard drinks  . Drug use: No    Current Outpatient Medications  Medication Sig Dispense Refill  . alendronate (FOSAMAX) 70 MG tablet TAKE 1 TABLET BY MOUTH ONE TIME PER WEEK 12 tablet 0  . Calcium Carb-Cholecalciferol (CALCIUM-VITAMIN D) 600-400 MG-UNIT TABS Take 1 tablet by mouth daily.    . Cholecalciferol (VITAMIN D3) 1000 units CAPS Take 1 capsule (1,000 Units total) by mouth daily. 30 capsule   . gabapentin (NEURONTIN) 600 MG tablet TAKE 1 TABLET BY MOUTH TWICE A DAY (Patient taking differently: As needed)  180 tablet 1  . lovastatin (MEVACOR) 40 MG tablet TAKE 1 TABLET BY MOUTH EVERYDAY AT BEDTIME 90 tablet 0  . Multiple Vitamin (MULTIVITAMIN) tablet Take 1 tablet by mouth daily.    . naproxen sodium (ANAPROX) 220 MG tablet Take 220 mg by mouth 2 (two) times daily with a meal. As needed    . nortriptyline (PAMELOR) 50 MG capsule Take 1 capsule (50 mg total) by mouth at bedtime. 90 capsule 1  . vitamin B-12 (CYANOCOBALAMIN) 1000 MCG tablet Take 1 tablet (1,000 mcg total) by mouth daily.     No current facility-administered medications for this visit.    No Known Allergies  Review of Systems  Constitutional: Negative for activity change, appetite change and unexpected weight change.  HENT: Negative for trouble swallowing and voice change.   Respiratory: Positive for choking, shortness of breath (With heavy activity) and wheezing. Negative for chest tightness.   Cardiovascular: Positive for leg swelling. Negative for chest pain.  Musculoskeletal: Positive for arthralgias and gait problem.  Neurological:       Neuropathic pain in feet  Psychiatric/Behavioral: The patient is nervous/anxious.   All other systems reviewed and are negative.   BP 116/77   Pulse 100   Resp 20   Ht 5' 3.5" (1.613 m)   Wt 135 lb (61.2 kg)   SpO2 97%   BMI 23.54 kg/m  Physical Exam Vitals reviewed.  Constitutional:      General: She is not in acute distress.    Appearance: Normal appearance.  HENT:     Head: Normocephalic and atraumatic.  Eyes:     General: No scleral icterus.    Extraocular Movements: Extraocular movements intact.  Neck:     Vascular: No carotid bruit.  Cardiovascular:     Rate and Rhythm: Normal rate and regular rhythm.     Heart sounds: Normal heart sounds. No murmur heard. No friction rub. No gallop.   Pulmonary:     Effort: No respiratory distress.     Breath sounds: Normal breath sounds. No wheezing or rales.  Abdominal:     General: There is no distension.     Palpations:  Abdomen is soft.     Tenderness: There is no abdominal tenderness.  Musculoskeletal:     Cervical back: Neck  supple.  Lymphadenopathy:     Cervical: No cervical adenopathy.  Skin:    General: Skin is warm and dry.  Neurological:     General: No focal deficit present.     Mental Status: She is alert and oriented to person, place, and time.     Cranial Nerves: No cranial nerve deficit.     Motor: No weakness.     Gait: Gait normal.    Diagnostic Tests: CT CHEST WITHOUT CONTRAST FOR LUNG CANCER SCREENING NODULE FOLLOW-UP  TECHNIQUE: Multidetector CT imaging of the chest was performed following the standard protocol without IV contrast.  COMPARISON:  11/11/2019  FINDINGS: Cardiovascular: The heart size is normal. No substantial pericardial effusion. Coronary artery calcification is evident. Atherosclerotic calcification is noted in the wall of the thoracic aorta.  Mediastinum/Nodes: No mediastinal lymphadenopathy. No evidence for gross hilar lymphadenopathy although assessment is limited by the lack of intravenous contrast on today's study. The esophagus has normal imaging features. There is no axillary lymphadenopathy.  Lungs/Pleura: Biapical pleuroparenchymal scarring evident. Centrilobular and paraseptal emphysema evident.  Mixed attenuation lesion of concern in the peripheral left lower lobe previously is similar today although there is slight progression of soft tissue component along the inferior aspect (compare axial image 214/series 3 today to axial image 224/series 3 previously. This lesion is difficult to reproducibly measure given the combination of cystic, ground-glass, and soft tissue attenuating components. However, DynaCAD measurements indicate progression with equivalent diameter of 15.6 mm today compared to 11.3 mm previously with solid component now 9.8 mm compared to 6.6 mm previously. Otherwise no new suspicious pulmonary nodule or mass. No  focal airspace consolidation. No pleural effusion.  Upper Abdomen: 17 mm low-density lesion upper pole left kidney approaches water attenuation, compatible with a cyst.  Musculoskeletal: No worrisome lytic or sclerotic osseous abnormality.  IMPRESSION: 1. Mixed attenuation lesion of concern in the peripheral left lower lobe is mildly progressive in the interval. Lung-RADS 4B, suspicious. Additional imaging evaluation or consultation with Pulmonology or Thoracic Surgery recommended. 2. Aortic Atherosclerosis (ICD10-I70.0) and Emphysema (ICD10-J43.9).  These results will be called to the ordering clinician or representative by the Radiologist Assistant, and communication documented in the PACS or [employer].   Electronically Signed   By: Misty Stanley M.D.   On: 02/16/2020 10:27 NUCLEAR MEDICINE PET SKULL BASE TO THIGH  TECHNIQUE: 7.8 mCi F-18 FDG was injected intravenously. Full-ring PET imaging was performed from the skull base to thigh after the radiotracer. CT data was obtained and used for attenuation correction and anatomic localization.  Fasting blood glucose: 87 mg/dl  COMPARISON:  None.  FINDINGS: Mediastinal blood pool activity: SUV max 2.3  Liver activity: SUV max NA  NECK: No hypermetabolic lymph nodes in the neck.  Incidental CT findings: none  CHEST: Within the periphery of the LEFT lower lobe there is a cavitary nodule with asymmetric thickened border. The thickened portion of the lesion has associated metabolic activity with SUV max equal 2.4 (image 112). This activity is mild and similar to background blood pool activity.  No additional suspicious pulmonary nodules.  No hypermetabolic mediastinal lymph nodes or enlarged mediastinal nodes.  Incidental CT findings: none  ABDOMEN/PELVIS: No abnormal hypermetabolic activity within the liver, pancreas, adrenal glands, or spleen. No hypermetabolic lymph nodes in the abdomen  or pelvis.  Incidental CT findings: Post hysterectomy.  Adnexa unremarkable  SKELETON: No focal hypermetabolic activity to suggest skeletal metastasis.  Incidental CT findings: none  IMPRESSION: 1. Mild metabolic activity associated with  the a cavitary lesion in the LEFT lower lobe. The nodular portion is mildly hypermetabolic. Lesion is indeterminate. Recommend either consider continued CT surveillance versus tissue sampling. 2. No evidence metastatic adenopathy or distant metastatic disease   Electronically Signed   By: Suzy Bouchard M.D.   On: 02/27/2020 11:29 I personally reviewed the CT and PET/CT images.  I compared the CT to her images from 2019.  There is a mixed density nodule with solid, groundglass, and cystic features in the anterolateral left lower lobe.  This has mild hypermetabolic uptake on PET/CT.  There is no evidence of regional or distant metastatic disease.   Impression: Cassie Shaw is a 75 year old woman with a history of tobacco abuse (quit 2006), peripheral neuropathy, hyperlipidemia, B12 deficiency, fibromyalgia, osteoporosis, and irritable bowel syndrome.  She had a low-dose screening CT for lung cancer in October which showed a new mixed density cystic nodule in the left lower lobe.  A follow-up CT in 3 months showed some progression.  The cystic lesion does have an area of increased wall thickness on the lateral aspect.  That was hypermetabolic on PET/CT.  The differential diagnosis includes primary bronchogenic carcinoma, infectious and inflammatory nodules.  Given her age, smoking history, and appearance of the nodule, short-term interval growth, and activity on PET this has to be considered a new primary bronchogenic carcinoma less can be proven otherwise.  Lesion is not amenable to bronchoscopic or percutaneous biopsy due to cystic nature and peripheral location.  I discussed the options of continued radiographic follow-up versus surgical  resection for definitive diagnosis and treatment.  We discussed the advantages and disadvantages of each approach.  I recommended that we proceed with a robotic left VATS for wedge resection and possible left lower lobectomy depending on the results of intraoperative frozen section.  She does have adequate pulmonary reserve to tolerate the operation.  I informed Mrs. Nada Boozer and her husband of the general nature of the procedure including the need for general anesthesia, the incisions to be used, the use of drains to postoperatively, the expected hospital stay, and the overall recovery.  I informed him of the indications, risks, benefits, and alternatives.  They understand the risks include, but not limited to death, MI, DVT, PE, bleeding, possibly transfusion, infection, prolonged air leak, as well as the possibility of other unstable complications.  After long discussion she does wish to proceed with surgical resection.  Plan: Robotic left VATS for wedge resection and possible lobectomy on Thursday, 03/15/2020.  Melrose Nakayama, MD [facility] [phone]

## 2020-03-07 NOTE — H&P (View-Only) (Signed)
PCP is Ria Bush, MD Referring Provider is Ria Bush, MD  Chief Complaint  Patient presents with  . Lung Lesion    Surgical consult, PET Scan 02/27/20, PFT's 02/28/20, Chest CT 11/11/19    HPI: Cassie Shaw is sent for consultation regarding a cystic left lower lobe lung nodule.  Cassie Shaw is a 75 year old woman with a history of tobacco abuse (quit 2006), peripheral neuropathy, hyperlipidemia, B12 deficiency, fibromyalgia, osteoporosis, and irritable bowel syndrome.  She had a low-dose CT for lung cancer screening in October.  It showed evidence of paraseptal and centrilobular emphysema and a left lower lobe lung nodule that had cystic, groundglass, and solid components.  The equivalent diameter was 11.3 mm in the central solid component measured 6.6 mm.  A follow-up CT at 3 months was recommended.  The nodule was persistent and measured at equivalent diameter of 15.6 mm with a solid component of 9.8 mm.  A PET/CT showed mild uptake with an SUV of 2.4.  She has a 46-pack-year history of smoking prior to quitting in 2006.  She complains of a cough, usually dry but occasionally productive of clear mucus.  No hemoptysis.  Occasional wheezing.  She was given a trial inhaler but has not noticed any change with that.  Her primary complaint is pain in her feet from neuropathy.  She does take gabapentin for that.  She denies any change in appetite or weight loss.  She denies headaches or visual changes.  Zubrod Score: At the time of surgery this patient's most appropriate activity status/level should be described as: []     0    Normal activity, no symptoms [x]     1    Restricted in physical strenuous activity but ambulatory, able to do out light work []     2    Ambulatory and capable of self care, unable to do work activities, up and about >50 % of waking hours                              []     3    Only limited self care, in bed greater than 50% of waking hours []     4    Completely  disabled, no self care, confined to bed or chair []     5    Moribund  Past Medical History:  Diagnosis Date  . Arthritis   . B12 deficiency   . Ex-smoker   . Fibrocystic breast disease   . Fibromyalgia   . History of chicken pox   . History of depression    and anxiety  . HLD (hyperlipidemia)   . IBS (irritable bowel syndrome)   . Osteoporosis 2015   on/off fosamax DEXA T -3.5 spine, -2.5 hip  . Peripheral neuropathy   . Retinal detachment     Past Surgical History:  Procedure Laterality Date  . CATARACT EXTRACTION Bilateral 2014   with lens implant  . COLONOSCOPY  12/2005   WNL ([facility])  . COLONOSCOPY  08/2017   5 TAs, rpt 5 yrs ([facility])  . DEXA  01/2013   T -3.5 spine, -2.5 hip  . PARTIAL HYSTERECTOMY  1975   for heavy bleeding, ovaries remained  . RETINAL DETACHMENT SURGERY  2014   Dr Zadie Rhine    Family History  Problem Relation Age of Onset  . Arthritis Mother   . Cancer Mother        smoker  . Cancer  Sister        bone  . Diabetes Maternal Uncle   . Cancer Maternal Grandfather   . Stroke Neg Hx   . CAD Neg Hx   . Breast cancer Neg Hx     Social History Social History   Tobacco Use  . Smoking status: Former Smoker    Packs/day: 1.00    Years: 46.00    Pack years: 46.00    Types: Cigarettes    Quit date: 01/21/2004    Years since quitting: 16.1  . Smokeless tobacco: Never Used  Substance Use Topics  . Alcohol use: No    Alcohol/week: 0.0 standard drinks  . Drug use: No    Current Outpatient Medications  Medication Sig Dispense Refill  . alendronate (FOSAMAX) 70 MG tablet TAKE 1 TABLET BY MOUTH ONE TIME PER WEEK 12 tablet 0  . Calcium Carb-Cholecalciferol (CALCIUM-VITAMIN D) 600-400 MG-UNIT TABS Take 1 tablet by mouth daily.    . Cholecalciferol (VITAMIN D3) 1000 units CAPS Take 1 capsule (1,000 Units total) by mouth daily. 30 capsule   . gabapentin (NEURONTIN) 600 MG tablet TAKE 1 TABLET BY MOUTH TWICE A DAY (Patient taking differently: As needed)  180 tablet 1  . lovastatin (MEVACOR) 40 MG tablet TAKE 1 TABLET BY MOUTH EVERYDAY AT BEDTIME 90 tablet 0  . Multiple Vitamin (MULTIVITAMIN) tablet Take 1 tablet by mouth daily.    . naproxen sodium (ANAPROX) 220 MG tablet Take 220 mg by mouth 2 (two) times daily with a meal. As needed    . nortriptyline (PAMELOR) 50 MG capsule Take 1 capsule (50 mg total) by mouth at bedtime. 90 capsule 1  . vitamin B-12 (CYANOCOBALAMIN) 1000 MCG tablet Take 1 tablet (1,000 mcg total) by mouth daily.     No current facility-administered medications for this visit.    No Known Allergies  Review of Systems  Constitutional: Negative for activity change, appetite change and unexpected weight change.  HENT: Negative for trouble swallowing and voice change.   Respiratory: Positive for choking, shortness of breath (With heavy activity) and wheezing. Negative for chest tightness.   Cardiovascular: Positive for leg swelling. Negative for chest pain.  Musculoskeletal: Positive for arthralgias and gait problem.  Neurological:       Neuropathic pain in feet  Psychiatric/Behavioral: The patient is nervous/anxious.   All other systems reviewed and are negative.   BP 116/77   Pulse 100   Resp 20   Ht 5' 3.5" (1.613 m)   Wt 135 lb (61.2 kg)   SpO2 97%   BMI 23.54 kg/m  Physical Exam Vitals reviewed.  Constitutional:      General: She is not in acute distress.    Appearance: Normal appearance.  HENT:     Head: Normocephalic and atraumatic.  Eyes:     General: No scleral icterus.    Extraocular Movements: Extraocular movements intact.  Neck:     Vascular: No carotid bruit.  Cardiovascular:     Rate and Rhythm: Normal rate and regular rhythm.     Heart sounds: Normal heart sounds. No murmur heard. No friction rub. No gallop.   Pulmonary:     Effort: No respiratory distress.     Breath sounds: Normal breath sounds. No wheezing or rales.  Abdominal:     General: There is no distension.     Palpations:  Abdomen is soft.     Tenderness: There is no abdominal tenderness.  Musculoskeletal:     Cervical back: Neck  supple.  Lymphadenopathy:     Cervical: No cervical adenopathy.  Skin:    General: Skin is warm and dry.  Neurological:     General: No focal deficit present.     Mental Status: She is alert and oriented to person, place, and time.     Cranial Nerves: No cranial nerve deficit.     Motor: No weakness.     Gait: Gait normal.    Diagnostic Tests: CT CHEST WITHOUT CONTRAST FOR LUNG CANCER SCREENING NODULE FOLLOW-UP  TECHNIQUE: Multidetector CT imaging of the chest was performed following the standard protocol without IV contrast.  COMPARISON:  11/11/2019  FINDINGS: Cardiovascular: The heart size is normal. No substantial pericardial effusion. Coronary artery calcification is evident. Atherosclerotic calcification is noted in the wall of the thoracic aorta.  Mediastinum/Nodes: No mediastinal lymphadenopathy. No evidence for gross hilar lymphadenopathy although assessment is limited by the lack of intravenous contrast on today's study. The esophagus has normal imaging features. There is no axillary lymphadenopathy.  Lungs/Pleura: Biapical pleuroparenchymal scarring evident. Centrilobular and paraseptal emphysema evident.  Mixed attenuation lesion of concern in the peripheral left lower lobe previously is similar today although there is slight progression of soft tissue component along the inferior aspect (compare axial image 214/series 3 today to axial image 224/series 3 previously. This lesion is difficult to reproducibly measure given the combination of cystic, ground-glass, and soft tissue attenuating components. However, Cassie Shaw measurements indicate progression with equivalent diameter of 15.6 mm today compared to 11.3 mm previously with solid component now 9.8 mm compared to 6.6 mm previously. Otherwise no new suspicious pulmonary nodule or mass. No  focal airspace consolidation. No pleural effusion.  Upper Abdomen: 17 mm low-density lesion upper pole left kidney approaches water attenuation, compatible with a cyst.  Musculoskeletal: No worrisome lytic or sclerotic osseous abnormality.  IMPRESSION: 1. Mixed attenuation lesion of concern in the peripheral left lower lobe is mildly progressive in the interval. Lung-RADS 4B, suspicious. Additional imaging evaluation or consultation with Pulmonology or Thoracic Surgery recommended. 2. Aortic Atherosclerosis (ICD10-I70.0) and Emphysema (ICD10-J43.9).  These results will be called to the ordering clinician or representative by the Radiologist Assistant, and communication documented in the PACS or [employer].   Electronically Signed   By: Misty Stanley M.D.   On: 02/16/2020 10:27 NUCLEAR MEDICINE PET SKULL BASE TO THIGH  TECHNIQUE: 7.8 mCi F-18 FDG was injected intravenously. Full-ring PET imaging was performed from the skull base to thigh after the radiotracer. CT data was obtained and used for attenuation correction and anatomic localization.  Fasting blood glucose: 87 mg/dl  COMPARISON:  None.  FINDINGS: Mediastinal blood pool activity: SUV max 2.3  Liver activity: SUV max NA  NECK: No hypermetabolic lymph nodes in the neck.  Incidental CT findings: none  CHEST: Within the periphery of the LEFT lower lobe there is a cavitary nodule with asymmetric thickened border. The thickened portion of the lesion has associated metabolic activity with SUV max equal 2.4 (image 112). This activity is mild and similar to background blood pool activity.  No additional suspicious pulmonary nodules.  No hypermetabolic mediastinal lymph nodes or enlarged mediastinal nodes.  Incidental CT findings: none  ABDOMEN/PELVIS: No abnormal hypermetabolic activity within the liver, pancreas, adrenal glands, or spleen. No hypermetabolic lymph nodes in the abdomen  or pelvis.  Incidental CT findings: Post hysterectomy.  Adnexa unremarkable  SKELETON: No focal hypermetabolic activity to suggest skeletal metastasis.  Incidental CT findings: none  IMPRESSION: 1. Mild metabolic activity associated with  the a cavitary lesion in the LEFT lower lobe. The nodular portion is mildly hypermetabolic. Lesion is indeterminate. Recommend either consider continued CT surveillance versus tissue sampling. 2. No evidence metastatic adenopathy or distant metastatic disease   Electronically Signed   By: Suzy Bouchard M.D.   On: 02/27/2020 11:29 I personally reviewed the CT and PET/CT images.  I compared the CT to her images from 2019.  There is a mixed density nodule with solid, groundglass, and cystic features in the anterolateral left lower lobe.  This has mild hypermetabolic uptake on PET/CT.  There is no evidence of regional or distant metastatic disease.   Impression: Cassie Shaw is a 75 year old woman with a history of tobacco abuse (quit 2006), peripheral neuropathy, hyperlipidemia, B12 deficiency, fibromyalgia, osteoporosis, and irritable bowel syndrome.  She had a low-dose screening CT for lung cancer in October which showed a new mixed density cystic nodule in the left lower lobe.  A follow-up CT in 3 months showed some progression.  The cystic lesion does have an area of increased wall thickness on the lateral aspect.  That was hypermetabolic on PET/CT.  The differential diagnosis includes primary bronchogenic carcinoma, infectious and inflammatory nodules.  Given her age, smoking history, and appearance of the nodule, short-term interval growth, and activity on PET this has to be considered a new primary bronchogenic carcinoma less can be proven otherwise.  Lesion is not amenable to bronchoscopic or percutaneous biopsy due to cystic nature and peripheral location.  I discussed the options of continued radiographic follow-up versus surgical  resection for definitive diagnosis and treatment.  We discussed the advantages and disadvantages of each approach.  I recommended that we proceed with a robotic left VATS for wedge resection and possible left lower lobectomy depending on the results of intraoperative frozen section.  She does have adequate pulmonary reserve to tolerate the operation.  I informed Mrs. Nada Boozer and her husband of the general nature of the procedure including the need for general anesthesia, the incisions to be used, the use of drains to postoperatively, the expected hospital stay, and the overall recovery.  I informed him of the indications, risks, benefits, and alternatives.  They understand the risks include, but not limited to death, MI, DVT, PE, bleeding, possibly transfusion, infection, prolonged air leak, as well as the possibility of other unstable complications.  After long discussion she does wish to proceed with surgical resection.  Plan: Robotic left VATS for wedge resection and possible lobectomy on Thursday, 03/15/2020.  Melrose Nakayama, MD [facility] [phone]

## 2020-03-10 ENCOUNTER — Telehealth: Payer: Self-pay | Admitting: Family Medicine

## 2020-03-10 MED ORDER — VITAMIN B-12 1000 MCG PO TABS: 1000.0000 ug | ORAL_TABLET | ORAL | Status: DC

## 2020-03-10 NOTE — Telephone Encounter (Signed)
Pt recently had CPE labs. Had to reschedule CPE as she has upcoming lung surgery for pulm mass  plz call - overall stable labs, do recommend holding vit B12 for 2 wks then restarting at MWF dosing as her levels returned high. I hope she has a speedy recovery.

## 2020-03-12 NOTE — Pre-Procedure Instructions (Signed)
Surgical Instructions    Your procedure is scheduled on Friday, February 24th.  Report to [facility] Main Entrance "A" at 6:00 A.M., then check in with the Admitting office.  Call this number if you have problems the morning of surgery:  [phone]   If you have any questions prior to your surgery date call [phone]: Open Monday-Friday 8am-4pm    Remember:  Do not eat or drink after midnight the night before your surgery    Take these medicines the morning of surgery with A SIP OF WATER  gabapentin (NEURONTIN)  As of today, STOP taking any Aspirin (unless otherwise instructed by your surgeon) Aleve, Naproxen, Ibuprofen, Motrin, Advil, Goody's, BC's, all herbal medications, fish oil, and all vitamins.                     Do not wear jewelry, make up, or nail polish            Do not wear lotions, powders, perfumes, or deodorant.            Do not shave 48 hours prior to surgery.             Do not bring valuables to the hospital.            [facility] is not responsible for any belongings or valuables.  Do NOT Smoke (Tobacco/Vaping) or drink Alcohol 24 hours prior to your procedure If you use a CPAP at night, you may bring all equipment for your overnight stay.   Contacts, glasses, dentures or bridgework may not be worn into surgery, please bring cases for these belongings   For patients admitted to the hospital, discharge time will be determined by your treatment team.   Patients discharged the day of surgery will not be allowed to drive home, and someone needs to stay with them for 24 hours.    Special instructions:   [place]- Preparing For Surgery  Before surgery, you can play an important role. Because skin is not sterile, your skin needs to be as free of germs as possible. You can reduce the number of germs on your skin by washing with CHG (chlorahexidine gluconate) Soap before surgery.  CHG is an antiseptic cleaner which kills germs and bonds with the skin to  continue killing germs even after washing.    Oral Hygiene is also important to reduce your risk of infection.  Remember - BRUSH YOUR TEETH THE MORNING OF SURGERY WITH YOUR REGULAR TOOTHPASTE  Please do not use if you have an allergy to CHG or antibacterial soaps. If your skin becomes reddened/irritated stop using the CHG.  Do not shave (including legs and underarms) for at least 48 hours prior to first CHG shower. It is OK to shave your face.  Please follow these instructions carefully.   1. Shower the NIGHT BEFORE SURGERY and the MORNING OF SURGERY  2. If you chose to wash your hair, wash your hair first as usual with your normal shampoo.  3. After you shampoo, rinse your hair and body thoroughly to remove the shampoo.  4. Use CHG Soap as you would any other liquid soap. You can apply CHG directly to the skin and wash gently with a scrungie or a clean washcloth.   5. Apply the CHG Soap to your body ONLY FROM THE NECK DOWN.  Do not use on open wounds or open sores. Avoid contact with your eyes, ears, mouth and genitals (private parts). Wash Face  and genitals (private parts)  with your normal soap.   6. Wash thoroughly, paying special attention to the area where your surgery will be performed.  7. Thoroughly rinse your body with warm water from the neck down.  8. DO NOT shower/wash with your normal soap after using and rinsing off the CHG Soap.  9. Pat yourself dry with a CLEAN TOWEL.  10. Wear CLEAN PAJAMAS to bed the night before surgery  11. Place CLEAN SHEETS on your bed the night before your surgery  12. DO NOT SLEEP WITH PETS.   Day of Surgery: Shower with the CHG soap. Wear Clean/Comfortable clothing the morning of surgery Do not apply any deodorants/lotions.   Remember to brush your teeth WITH YOUR REGULAR TOOTHPASTE.   Please read over the following fact sheets that you were given.

## 2020-03-13 ENCOUNTER — Encounter (HOSPITAL_COMMUNITY)
Admission: RE | Admit: 2020-03-13 | Discharge: 2020-03-13 | Disposition: A | Discharge status: Home / Self Care | Payer: Federal, State, Local not specified - PPO | Source: Ambulatory Visit | Attending: Thoracic Surgery (Cardiothoracic Vascular Surgery) | Admitting: Thoracic Surgery (Cardiothoracic Vascular Surgery)

## 2020-03-13 ENCOUNTER — Ambulatory Visit (HOSPITAL_COMMUNITY)
Admission: RE | Admit: 2020-03-13 | Discharge: 2020-03-13 | Disposition: A | Discharge status: Home / Self Care | Payer: Federal, State, Local not specified - PPO | Source: Ambulatory Visit | Attending: Thoracic Surgery (Cardiothoracic Vascular Surgery) | Admitting: Thoracic Surgery (Cardiothoracic Vascular Surgery)

## 2020-03-13 ENCOUNTER — Other Ambulatory Visit
Admission: RE | Admit: 2020-03-13 | Discharge: 2020-03-13 | Disposition: A | Discharge status: Home / Self Care | Payer: Federal, State, Local not specified - PPO | Source: Ambulatory Visit | Attending: Thoracic Surgery (Cardiothoracic Vascular Surgery) | Admitting: Thoracic Surgery (Cardiothoracic Vascular Surgery)

## 2020-03-13 ENCOUNTER — Ambulatory Visit: Payer: Federal, State, Local not specified - PPO | Admitting: Family Medicine

## 2020-03-13 ENCOUNTER — Other Ambulatory Visit: Payer: Self-pay

## 2020-03-13 ENCOUNTER — Encounter (HOSPITAL_COMMUNITY): Payer: Self-pay

## 2020-03-13 DIAGNOSIS — R911 Solitary pulmonary nodule: Secondary | ICD-10-CM

## 2020-03-13 DIAGNOSIS — Z01818 Encounter for other preprocedural examination: Secondary | ICD-10-CM | POA: Insufficient documentation

## 2020-03-13 DIAGNOSIS — Z20822 Contact with and (suspected) exposure to covid-19: Secondary | ICD-10-CM | POA: Insufficient documentation

## 2020-03-13 DIAGNOSIS — Z01812 Encounter for preprocedural laboratory examination: Secondary | ICD-10-CM | POA: Insufficient documentation

## 2020-03-13 HISTORY — DX: Chronic obstructive pulmonary disease, unspecified: J44.9

## 2020-03-13 LAB — BLOOD GAS, ARTERIAL
Acid-base deficit: 0.1 mmol/L (ref 0.0–2.0)
Bicarbonate: 23.8 mmol/L (ref 20.0–28.0)
Drawn by: 60286
FIO2: 21
O2 Saturation: 97.2 %
Patient temperature: 37
pCO2 arterial: 37.6 mmHg (ref 32.0–48.0)
pH, Arterial: 7.418 (ref 7.350–7.450)
pO2, Arterial: 93.4 mmHg (ref 83.0–108.0)

## 2020-03-13 LAB — CBC
HCT: 40.6 % (ref 36.0–46.0)
Hemoglobin: 13.3 g/dL (ref 12.0–15.0)
MCH: 30.9 pg (ref 26.0–34.0)
MCHC: 32.8 g/dL (ref 30.0–36.0)
MCV: 94.2 fL (ref 80.0–100.0)
Platelets: 180 10*3/uL (ref 150–400)
RBC: 4.31 MIL/uL (ref 3.87–5.11)
RDW: 12.3 % (ref 11.5–15.5)
WBC: 6.8 10*3/uL (ref 4.0–10.5)
nRBC: 0 % (ref 0.0–0.2)

## 2020-03-13 LAB — COMPREHENSIVE METABOLIC PANEL
ALT: 17 U/L (ref 0–44)
AST: 29 U/L (ref 15–41)
Albumin: 3.8 g/dL (ref 3.5–5.0)
Alkaline Phosphatase: 25 U/L — ABNORMAL LOW (ref 38–126)
Anion gap: 12 (ref 5–15)
BUN: 11 mg/dL (ref 8–23)
CO2: 20 mmol/L — ABNORMAL LOW (ref 22–32)
Calcium: 9.1 mg/dL (ref 8.9–10.3)
Chloride: 106 mmol/L (ref 98–111)
Creatinine, Ser: 0.74 mg/dL (ref 0.44–1.00)
GFR, Estimated: >60 mL/min (ref >60)
Glucose, Bld: 134 mg/dL — ABNORMAL HIGH (ref 70–99)
Potassium: 4.1 mmol/L (ref 3.5–5.1)
Sodium: 138 mmol/L (ref 135–145)
Total Bilirubin: 0.6 mg/dL (ref 0.3–1.2)
Total Protein: 7.2 g/dL (ref 6.5–8.1)

## 2020-03-13 LAB — PROTIME-INR
INR: 1 (ref 0.8–1.2)
Prothrombin Time: 12.4 seconds (ref 11.4–15.2)

## 2020-03-13 LAB — URINALYSIS, ROUTINE W REFLEX MICROSCOPIC
Bilirubin Urine: NEGATIVE
Glucose, UA: NEGATIVE mg/dL
Hgb urine dipstick: NEGATIVE
Ketones, ur: NEGATIVE mg/dL
Leukocytes,Ua: NEGATIVE
Nitrite: NEGATIVE
Protein, ur: NEGATIVE mg/dL
Specific Gravity, Urine: 1.024 (ref 1.005–1.030)
pH: 5 (ref 5.0–8.0)

## 2020-03-13 LAB — SURGICAL PCR SCREEN
MRSA, PCR: NEGATIVE
Staphylococcus aureus: NEGATIVE

## 2020-03-13 LAB — TYPE AND SCREEN
ABO/RH(D): AB NEG
Antibody Screen: NEGATIVE

## 2020-03-13 LAB — SARS CORONAVIRUS 2 (TAT 6-24 HRS): SARS Coronavirus 2: NEGATIVE

## 2020-03-13 LAB — APTT: aPTT: 21 seconds — ABNORMAL LOW (ref 24–36)

## 2020-03-13 NOTE — Progress Notes (Signed)
PCP - Dr. Biagio Borg Cardiologist - denies  Chest x-ray - 03/13/20 EKG - 03/13/20 Stress Test - denies ECHO - denies Cardiac Cath - denies  Sleep Study - denies CPAP - denies  Blood Thinner Instructions:n/a Aspirin Instructions:n/a  COVID TEST- 03/13/20   Anesthesia review: Yes  Patient denies shortness of breath, fever, cough and chest pain at PAT appointment   All instructions explained to the patient, with a verbal understanding of the material. Patient agrees to go over the instructions while at home for a better understanding. Patient also instructed to self quarantine after being tested for COVID-19. The opportunity to ask questions was provided.

## 2020-03-13 NOTE — Telephone Encounter (Signed)
Spoke with pt relaying Dr. Synthia Innocent message.  Pt verbalizes understanding and informed husband to help her remember.

## 2020-03-14 NOTE — Anesthesia Preprocedure Evaluation (Addendum)
Anesthesia Evaluation  Patient identified by MRN, date of birth, ID band Patient awake    Reviewed: Allergy & Precautions, NPO status , Patient's Chart, lab work & pertinent test results  Airway Mallampati: III  TM Distance: >3 FB Neck ROM: Full    Dental  (+) Missing, Loose,    Pulmonary COPD, former smoker,    Pulmonary exam normal breath sounds clear to auscultation       Cardiovascular Normal cardiovascular exam Rhythm:Regular Rate:Normal  ECG: NSR, rate 83   Neuro/Psych  Neuromuscular disease negative psych ROS   GI/Hepatic Neg liver ROS, IBS (irritable bowel syndrome)   Endo/Other  negative endocrine ROS  Renal/GU negative Renal ROS     Musculoskeletal  (+) Arthritis , Fibromyalgia -  Abdominal   Peds  Hematology negative hematology ROS (+)   Anesthesia Other Findings LLL LUNG NODULE  Reproductive/Obstetrics                            Anesthesia Physical Anesthesia Plan  ASA: III  Anesthesia Plan: General   Post-op Pain Management:    Induction: Intravenous  PONV Risk Score and Plan: 3 and Ondansetron, Dexamethasone and Treatment may vary due to age or medical condition  Airway Management Planned: Double Lumen EBT  Additional Equipment: Arterial line  Intra-op Plan:   Post-operative Plan: Extubation in OR  Informed Consent: I have reviewed the patients History and Physical, chart, labs and discussed the procedure including the risks, benefits and alternatives for the proposed anesthesia with the patient or authorized representative who has indicated his/her understanding and acceptance.     Dental advisory given  Plan Discussed with: CRNA  Anesthesia Plan Comments: (High probably of dental injury discussed with patient due to loose tooth. )       Anesthesia Quick Evaluation

## 2020-03-15 ENCOUNTER — Inpatient Hospital Stay (HOSPITAL_COMMUNITY)
Admission: RE | Admit: 2020-03-15 | Discharge: 2020-03-20 | DRG: 164 | Disposition: A | Discharge status: Home / Self Care | Payer: Medicare Other | Attending: Thoracic Surgery (Cardiothoracic Vascular Surgery) | Admitting: Thoracic Surgery (Cardiothoracic Vascular Surgery)

## 2020-03-15 ENCOUNTER — Inpatient Hospital Stay (HOSPITAL_COMMUNITY): Payer: Medicare Other

## 2020-03-15 ENCOUNTER — Encounter (HOSPITAL_COMMUNITY): Payer: Self-pay | Admitting: Thoracic Surgery (Cardiothoracic Vascular Surgery)

## 2020-03-15 ENCOUNTER — Other Ambulatory Visit: Payer: Self-pay

## 2020-03-15 ENCOUNTER — Inpatient Hospital Stay (HOSPITAL_COMMUNITY): Payer: Medicare Other | Admitting: Anesthesiology

## 2020-03-15 ENCOUNTER — Encounter (HOSPITAL_COMMUNITY)
Admission: RE | Disposition: A | Discharge status: Home / Self Care | Payer: Self-pay | Source: Home / Self Care | Attending: Thoracic Surgery (Cardiothoracic Vascular Surgery)

## 2020-03-15 ENCOUNTER — Inpatient Hospital Stay (HOSPITAL_COMMUNITY): Payer: Medicare Other | Admitting: Physician Assistant

## 2020-03-15 DIAGNOSIS — I700 Atherosclerosis of aorta: Secondary | ICD-10-CM | POA: Diagnosis present

## 2020-03-15 DIAGNOSIS — M797 Fibromyalgia: Secondary | ICD-10-CM | POA: Diagnosis present

## 2020-03-15 DIAGNOSIS — G629 Polyneuropathy, unspecified: Secondary | ICD-10-CM | POA: Diagnosis present

## 2020-03-15 DIAGNOSIS — C3432 Malignant neoplasm of lower lobe, left bronchus or lung: Secondary | ICD-10-CM | POA: Diagnosis present

## 2020-03-15 DIAGNOSIS — J9382 Other air leak: Secondary | ICD-10-CM | POA: Diagnosis not present

## 2020-03-15 DIAGNOSIS — J449 Chronic obstructive pulmonary disease, unspecified: Secondary | ICD-10-CM | POA: Diagnosis present

## 2020-03-15 DIAGNOSIS — R911 Solitary pulmonary nodule: Secondary | ICD-10-CM

## 2020-03-15 DIAGNOSIS — K589 Irritable bowel syndrome without diarrhea: Secondary | ICD-10-CM | POA: Diagnosis present

## 2020-03-15 DIAGNOSIS — Z9889 Other specified postprocedural states: Secondary | ICD-10-CM

## 2020-03-15 DIAGNOSIS — Z9079 Acquired absence of other genital organ(s): Secondary | ICD-10-CM

## 2020-03-15 DIAGNOSIS — Z4682 Encounter for fitting and adjustment of non-vascular catheter: Secondary | ICD-10-CM

## 2020-03-15 DIAGNOSIS — J939 Pneumothorax, unspecified: Secondary | ICD-10-CM

## 2020-03-15 DIAGNOSIS — Z09 Encounter for follow-up examination after completed treatment for conditions other than malignant neoplasm: Secondary | ICD-10-CM

## 2020-03-15 DIAGNOSIS — Z8261 Family history of arthritis: Secondary | ICD-10-CM

## 2020-03-15 DIAGNOSIS — E538 Deficiency of other specified B group vitamins: Secondary | ICD-10-CM | POA: Diagnosis present

## 2020-03-15 DIAGNOSIS — Y848 Other medical procedures as the cause of abnormal reaction of the patient, or of later complication, without mention of misadventure at the time of the procedure: Secondary | ICD-10-CM | POA: Diagnosis not present

## 2020-03-15 DIAGNOSIS — I493 Ventricular premature depolarization: Secondary | ICD-10-CM | POA: Diagnosis not present

## 2020-03-15 DIAGNOSIS — Z87891 Personal history of nicotine dependence: Secondary | ICD-10-CM | POA: Diagnosis not present

## 2020-03-15 DIAGNOSIS — Z791 Long term (current) use of non-steroidal anti-inflammatories (NSAID): Secondary | ICD-10-CM | POA: Diagnosis not present

## 2020-03-15 DIAGNOSIS — Z902 Acquired absence of lung [part of]: Secondary | ICD-10-CM

## 2020-03-15 DIAGNOSIS — I2510 Atherosclerotic heart disease of native coronary artery without angina pectoris: Secondary | ICD-10-CM | POA: Diagnosis present

## 2020-03-15 DIAGNOSIS — C3492 Malignant neoplasm of unspecified part of left bronchus or lung: Secondary | ICD-10-CM | POA: Diagnosis present

## 2020-03-15 DIAGNOSIS — M1990 Unspecified osteoarthritis, unspecified site: Secondary | ICD-10-CM | POA: Diagnosis present

## 2020-03-15 DIAGNOSIS — E785 Hyperlipidemia, unspecified: Secondary | ICD-10-CM | POA: Diagnosis present

## 2020-03-15 DIAGNOSIS — Z90710 Acquired absence of both cervix and uterus: Secondary | ICD-10-CM | POA: Diagnosis not present

## 2020-03-15 DIAGNOSIS — Z7983 Long term (current) use of bisphosphonates: Secondary | ICD-10-CM | POA: Diagnosis not present

## 2020-03-15 DIAGNOSIS — Z85118 Personal history of other malignant neoplasm of bronchus and lung: Secondary | ICD-10-CM | POA: Diagnosis present

## 2020-03-15 DIAGNOSIS — Z79899 Other long term (current) drug therapy: Secondary | ICD-10-CM

## 2020-03-15 DIAGNOSIS — M810 Age-related osteoporosis without current pathological fracture: Secondary | ICD-10-CM | POA: Diagnosis present

## 2020-03-15 DIAGNOSIS — D62 Acute posthemorrhagic anemia: Secondary | ICD-10-CM | POA: Diagnosis not present

## 2020-03-15 DIAGNOSIS — T82848A Pain from vascular prosthetic devices, implants and grafts, initial encounter: Secondary | ICD-10-CM | POA: Diagnosis not present

## 2020-03-15 DIAGNOSIS — J9811 Atelectasis: Secondary | ICD-10-CM | POA: Diagnosis not present

## 2020-03-15 HISTORY — PX: LYMPH NODE DISSECTION: SHX5087

## 2020-03-15 HISTORY — PX: INTERCOSTAL NERVE BLOCK: SHX5021

## 2020-03-15 LAB — GLUCOSE, CAPILLARY: Glucose-Capillary: 157 mg/dL — ABNORMAL HIGH (ref 70–99)

## 2020-03-15 LAB — ABO/RH: ABO/RH(D): AB NEG

## 2020-03-15 SURGERY — WEDGE RESECTION, LUNG, ROBOT-ASSISTED, THORACOSCOPIC
Anesthesia: General | Site: Chest | Laterality: Left

## 2020-03-15 MED ORDER — CHLORHEXIDINE GLUCONATE 0.12 % MT SOLN
OROMUCOSAL | Status: AC
  Administered 2020-03-15: 15 mL via OROMUCOSAL
  Filled 2020-03-15: qty 15

## 2020-03-15 MED ORDER — TRAMADOL HCL 50 MG PO TABS
50.0000 mg | ORAL_TABLET | Freq: Four times a day (QID) | ORAL | Status: DC | PRN
  Administered 2020-03-15: 100 mg via ORAL
  Filled 2020-03-15: qty 2

## 2020-03-15 MED ORDER — ONDANSETRON HCL 4 MG/2ML IJ SOLN: 4.0000 mg | Freq: Once | INTRAMUSCULAR | Status: DC | PRN

## 2020-03-15 MED ORDER — CEFAZOLIN SODIUM-DEXTROSE 2-4 GM/100ML-% IV SOLN
INTRAVENOUS | Status: AC
  Administered 2020-03-15: 2 g via INTRAVENOUS
  Filled 2020-03-15: qty 100

## 2020-03-15 MED ORDER — ACETAMINOPHEN 500 MG PO TABS
1000.0000 mg | ORAL_TABLET | Freq: Four times a day (QID) | ORAL | Status: DC
  Administered 2020-03-15 – 2020-03-19 (×15): 1000 mg via ORAL
  Filled 2020-03-15 (×16): qty 2

## 2020-03-15 MED ORDER — SODIUM CHLORIDE FLUSH 0.9 % IV SOLN
INTRAVENOUS | Status: DC | PRN
  Administered 2020-03-15: 100 mL

## 2020-03-15 MED ORDER — FENTANYL CITRATE (PF) 250 MCG/5ML IJ SOLN
INTRAMUSCULAR | Status: AC
  Filled 2020-03-15: qty 5

## 2020-03-15 MED ORDER — MELATONIN 5 MG PO TABS
5.0000 mg | ORAL_TABLET | Freq: Every evening | ORAL | Status: DC | PRN
  Administered 2020-03-15 – 2020-03-19 (×5): 5 mg via ORAL
  Filled 2020-03-15 (×5): qty 1

## 2020-03-15 MED ORDER — KETOROLAC TROMETHAMINE 15 MG/ML IJ SOLN
15.0000 mg | Freq: Four times a day (QID) | INTRAMUSCULAR | Status: AC
  Administered 2020-03-15 – 2020-03-18 (×10): 15 mg via INTRAVENOUS
  Filled 2020-03-15 (×10): qty 1

## 2020-03-15 MED ORDER — CHLORHEXIDINE GLUCONATE 0.12 % MT SOLN: 15.0000 mL | Freq: Once | OROMUCOSAL | Status: AC

## 2020-03-15 MED ORDER — DEXAMETHASONE SODIUM PHOSPHATE 10 MG/ML IJ SOLN
INTRAMUSCULAR | Status: AC
  Filled 2020-03-15: qty 1

## 2020-03-15 MED ORDER — INSULIN ASPART 100 UNIT/ML ~~LOC~~ SOLN
0.0000 [IU] | SUBCUTANEOUS | Status: DC
  Administered 2020-03-15: 2 [IU] via SUBCUTANEOUS

## 2020-03-15 MED ORDER — PHENYLEPHRINE HCL-NACL 10-0.9 MG/250ML-% IV SOLN
INTRAVENOUS | Status: DC | PRN
  Administered 2020-03-15: 25 ug/min via INTRAVENOUS

## 2020-03-15 MED ORDER — LIDOCAINE 2% (20 MG/ML) 5 ML SYRINGE
INTRAMUSCULAR | Status: DC | PRN
  Administered 2020-03-15: 60 mg via INTRAVENOUS

## 2020-03-15 MED ORDER — PHENYLEPHRINE 40 MCG/ML (10ML) SYRINGE FOR IV PUSH (FOR BLOOD PRESSURE SUPPORT)
PREFILLED_SYRINGE | INTRAVENOUS | Status: AC
  Filled 2020-03-15: qty 10

## 2020-03-15 MED ORDER — ACETAMINOPHEN 500 MG PO TABS
ORAL_TABLET | ORAL | Status: AC
  Administered 2020-03-15: 1000 mg via ORAL
  Filled 2020-03-15: qty 2

## 2020-03-15 MED ORDER — ENOXAPARIN SODIUM 40 MG/0.4ML ~~LOC~~ SOLN
40.0000 mg | Freq: Every day | SUBCUTANEOUS | Status: DC
  Administered 2020-03-15 – 2020-03-19 (×5): 40 mg via SUBCUTANEOUS
  Filled 2020-03-15 (×5): qty 0.4

## 2020-03-15 MED ORDER — FENTANYL CITRATE (PF) 100 MCG/2ML IJ SOLN
12.5000 ug | INTRAMUSCULAR | Status: DC | PRN
  Administered 2020-03-15 – 2020-03-20 (×12): 12.5 ug via INTRAVENOUS
  Filled 2020-03-15 (×12): qty 2

## 2020-03-15 MED ORDER — PROPOFOL 10 MG/ML IV BOLUS
INTRAVENOUS | Status: DC | PRN
  Administered 2020-03-15: 200 mg via INTRAVENOUS

## 2020-03-15 MED ORDER — LACTATED RINGERS IV SOLN: INTRAVENOUS | Status: DC | PRN

## 2020-03-15 MED ORDER — ONDANSETRON HCL 4 MG/2ML IJ SOLN
4.0000 mg | Freq: Four times a day (QID) | INTRAMUSCULAR | Status: DC | PRN
  Administered 2020-03-15: 4 mg via INTRAVENOUS
  Filled 2020-03-15: qty 2

## 2020-03-15 MED ORDER — GABAPENTIN 600 MG PO TABS
600.0000 mg | ORAL_TABLET | Freq: Every day | ORAL | Status: DC | PRN
  Administered 2020-03-15 – 2020-03-17 (×3): 600 mg via ORAL
  Filled 2020-03-15 (×4): qty 1

## 2020-03-15 MED ORDER — PHENYLEPHRINE 40 MCG/ML (10ML) SYRINGE FOR IV PUSH (FOR BLOOD PRESSURE SUPPORT)
PREFILLED_SYRINGE | INTRAVENOUS | Status: DC | PRN
  Administered 2020-03-15 (×2): 80 ug via INTRAVENOUS

## 2020-03-15 MED ORDER — SODIUM CHLORIDE 0.9 % IV SOLN: INTRAVENOUS | Status: DC | PRN

## 2020-03-15 MED ORDER — LACTATED RINGERS IV SOLN: INTRAVENOUS | Status: DC

## 2020-03-15 MED ORDER — BUPIVACAINE HCL (PF) 0.5 % IJ SOLN
INTRAMUSCULAR | Status: AC
  Filled 2020-03-15: qty 30

## 2020-03-15 MED ORDER — MIDAZOLAM HCL 2 MG/2ML IJ SOLN
INTRAMUSCULAR | Status: AC
  Filled 2020-03-15: qty 2

## 2020-03-15 MED ORDER — FENTANYL CITRATE (PF) 250 MCG/5ML IJ SOLN
INTRAMUSCULAR | Status: DC | PRN
  Administered 2020-03-15: 150 ug via INTRAVENOUS

## 2020-03-15 MED ORDER — SENNOSIDES-DOCUSATE SODIUM 8.6-50 MG PO TABS
1.0000 | ORAL_TABLET | Freq: Every day | ORAL | Status: DC
  Administered 2020-03-15 – 2020-03-17 (×3): 1 via ORAL
  Filled 2020-03-15 (×4): qty 1

## 2020-03-15 MED ORDER — FENTANYL CITRATE (PF) 100 MCG/2ML IJ SOLN
25.0000 ug | INTRAMUSCULAR | Status: DC | PRN
  Administered 2020-03-15 (×3): 25 ug via INTRAVENOUS

## 2020-03-15 MED ORDER — CEFAZOLIN SODIUM-DEXTROSE 2-4 GM/100ML-% IV SOLN
2.0000 g | INTRAVENOUS | Status: AC
  Administered 2020-03-15: 2 g via INTRAVENOUS

## 2020-03-15 MED ORDER — CEFAZOLIN SODIUM-DEXTROSE 2-4 GM/100ML-% IV SOLN
2.0000 g | Freq: Three times a day (TID) | INTRAVENOUS | Status: AC
  Administered 2020-03-15: 2 g via INTRAVENOUS
  Filled 2020-03-15 (×3): qty 100

## 2020-03-15 MED ORDER — HEMOSTATIC AGENTS (NO CHARGE) OPTIME
TOPICAL | Status: DC | PRN
  Administered 2020-03-15 (×2): 1 via TOPICAL

## 2020-03-15 MED ORDER — SUGAMMADEX SODIUM 200 MG/2ML IV SOLN
INTRAVENOUS | Status: DC | PRN
  Administered 2020-03-15: 150 mg via INTRAVENOUS

## 2020-03-15 MED ORDER — BISACODYL 5 MG PO TBEC
10.0000 mg | DELAYED_RELEASE_TABLET | Freq: Every day | ORAL | Status: DC
  Administered 2020-03-15 – 2020-03-18 (×4): 10 mg via ORAL
  Filled 2020-03-15 (×4): qty 2

## 2020-03-15 MED ORDER — CHLORHEXIDINE GLUCONATE CLOTH 2 % EX PADS
6.0000 | MEDICATED_PAD | Freq: Every day | CUTANEOUS | Status: DC
  Administered 2020-03-17 – 2020-03-18 (×2): 6 via TOPICAL

## 2020-03-15 MED ORDER — SODIUM CHLORIDE 0.9 % IV SOLN: INTRAVENOUS | Status: DC

## 2020-03-15 MED ORDER — BUPIVACAINE LIPOSOME 1.3 % IJ SUSP
20.0000 mL | Freq: Once | INTRAMUSCULAR | Status: DC
  Filled 2020-03-15: qty 20

## 2020-03-15 MED ORDER — FENTANYL CITRATE (PF) 100 MCG/2ML IJ SOLN
INTRAMUSCULAR | Status: AC
  Filled 2020-03-15: qty 2

## 2020-03-15 MED ORDER — ONDANSETRON HCL 4 MG/2ML IJ SOLN
INTRAMUSCULAR | Status: AC
  Filled 2020-03-15: qty 2

## 2020-03-15 MED ORDER — PRAVASTATIN SODIUM 40 MG PO TABS
40.0000 mg | ORAL_TABLET | Freq: Every day | ORAL | Status: DC
  Administered 2020-03-16 – 2020-03-19 (×4): 40 mg via ORAL
  Filled 2020-03-15 (×4): qty 1

## 2020-03-15 MED ORDER — DEXAMETHASONE SODIUM PHOSPHATE 10 MG/ML IJ SOLN
INTRAMUSCULAR | Status: DC | PRN
  Administered 2020-03-15: 10 mg via INTRAVENOUS

## 2020-03-15 MED ORDER — 0.9 % SODIUM CHLORIDE (POUR BTL) OPTIME
TOPICAL | Status: DC | PRN
  Administered 2020-03-15 (×2): 1000 mL

## 2020-03-15 MED ORDER — ROCURONIUM BROMIDE 10 MG/ML (PF) SYRINGE
PREFILLED_SYRINGE | INTRAVENOUS | Status: AC
  Filled 2020-03-15: qty 10

## 2020-03-15 MED ORDER — OXYCODONE HCL 5 MG PO TABS
5.0000 mg | ORAL_TABLET | ORAL | Status: DC | PRN
  Administered 2020-03-15 – 2020-03-16 (×3): 5 mg via ORAL
  Filled 2020-03-15 (×3): qty 1

## 2020-03-15 MED ORDER — LIDOCAINE 2% (20 MG/ML) 5 ML SYRINGE
INTRAMUSCULAR | Status: AC
  Filled 2020-03-15: qty 5

## 2020-03-15 MED ORDER — ORAL CARE MOUTH RINSE: 15.0000 mL | Freq: Once | OROMUCOSAL | Status: AC

## 2020-03-15 MED ORDER — ACETAMINOPHEN 160 MG/5ML PO SOLN: 1000.0000 mg | Freq: Four times a day (QID) | ORAL | Status: DC

## 2020-03-15 MED ORDER — ONDANSETRON HCL 4 MG/2ML IJ SOLN
INTRAMUSCULAR | Status: DC | PRN
  Administered 2020-03-15: 4 mg via INTRAVENOUS

## 2020-03-15 MED ORDER — PROPOFOL 10 MG/ML IV BOLUS
INTRAVENOUS | Status: AC
  Filled 2020-03-15: qty 20

## 2020-03-15 MED ORDER — ALBUMIN HUMAN 5 % IV SOLN: INTRAVENOUS | Status: DC | PRN

## 2020-03-15 MED ORDER — ROCURONIUM BROMIDE 10 MG/ML (PF) SYRINGE
PREFILLED_SYRINGE | INTRAVENOUS | Status: DC | PRN
  Administered 2020-03-15: 60 mg via INTRAVENOUS
  Administered 2020-03-15: 20 mg via INTRAVENOUS
  Administered 2020-03-15: 10 mg via INTRAVENOUS

## 2020-03-15 MED ORDER — ACETAMINOPHEN 500 MG PO TABS: 1000.0000 mg | ORAL_TABLET | Freq: Once | ORAL | Status: AC

## 2020-03-15 SURGICAL SUPPLY — 119 items
ADH SKN CLS APL DERMABOND .7 (GAUZE/BANDAGES/DRESSINGS) ×2
APPLIER CLIP ROT 10 11.4 M/L (STAPLE)
APR CLP MED LRG 11.4X10 (STAPLE)
BAG TISS RTRVL C300 12X14 (MISCELLANEOUS) ×2
BLADE CLIPPER SURG (BLADE) ×2 IMPLANT
BNDG COHESIVE 6X5 TAN STRL LF (GAUZE/BANDAGES/DRESSINGS) ×2 IMPLANT
CANISTER SUCT 3000ML PPV (MISCELLANEOUS) ×3 IMPLANT
CANNULA REDUC XI 12-8 STAPL (CANNULA) ×6
CANNULA REDUCER 12-8 DVNC XI (CANNULA) ×4 IMPLANT
CATH THORACIC 28FR (CATHETERS) IMPLANT
CATH THORACIC 28FR RT ANG (CATHETERS) IMPLANT
CATH THORACIC 36FR (CATHETERS) IMPLANT
CATH THORACIC 36FR RT ANG (CATHETERS) IMPLANT
CLIP APPLIE ROT 10 11.4 M/L (STAPLE) IMPLANT
CLIP LIGATING HEMO O LOK GREEN (MISCELLANEOUS) ×1 IMPLANT
CLIP VESOCCLUDE MED 6/CT (CLIP) IMPLANT
CNTNR URN SCR LID CUP LEK RST (MISCELLANEOUS) ×10 IMPLANT
CONN ST 1/4X3/8  BEN (MISCELLANEOUS) ×3
CONN ST 1/4X3/8 BEN (MISCELLANEOUS) IMPLANT
CONT SPEC 4OZ STRL OR WHT (MISCELLANEOUS) ×15
DEFOGGER SCOPE WARMER CLEARIFY (MISCELLANEOUS) ×3 IMPLANT
DERMABOND ADVANCED (GAUZE/BANDAGES/DRESSINGS) ×1
DERMABOND ADVANCED .7 DNX12 (GAUZE/BANDAGES/DRESSINGS) ×2 IMPLANT
DRAIN CHANNEL 28F RND 3/8 FF (WOUND CARE) IMPLANT
DRAIN CHANNEL 32F RND 10.7 FF (WOUND CARE) IMPLANT
DRAPE ARM DVNC X/XI (DISPOSABLE) ×8 IMPLANT
DRAPE COLUMN DVNC XI (DISPOSABLE) ×2 IMPLANT
DRAPE CV SPLIT W-CLR ANES SCRN (DRAPES) ×3 IMPLANT
DRAPE DA VINCI XI ARM (DISPOSABLE) ×12
DRAPE DA VINCI XI COLUMN (DISPOSABLE) ×3
DRAPE INCISE IOBAN 66X45 STRL (DRAPES) IMPLANT
DRAPE ORTHO SPLIT 77X108 STRL (DRAPES) ×3
DRAPE SURG ORHT 6 SPLT 77X108 (DRAPES) ×2 IMPLANT
ELECT BLADE 6.5 EXT (BLADE) ×1 IMPLANT
ELECT REM PT RETURN 9FT ADLT (ELECTROSURGICAL) ×3
ELECTRODE REM PT RTRN 9FT ADLT (ELECTROSURGICAL) ×2 IMPLANT
GAUZE KITTNER 4X5 RF (MISCELLANEOUS) ×7 IMPLANT
GAUZE SPONGE 4X4 12PLY STRL (GAUZE/BANDAGES/DRESSINGS) ×3 IMPLANT
GLOVE TRIUMPH SURG SIZE 7.5 (KITS) ×6 IMPLANT
GOWN STRL REUS W/ TWL LRG LVL3 (GOWN DISPOSABLE) ×4 IMPLANT
GOWN STRL REUS W/ TWL XL LVL3 (GOWN DISPOSABLE) ×6 IMPLANT
GOWN STRL REUS W/TWL 2XL LVL3 (GOWN DISPOSABLE) ×6 IMPLANT
GOWN STRL REUS W/TWL LRG LVL3 (GOWN DISPOSABLE) ×6
GOWN STRL REUS W/TWL XL LVL3 (GOWN DISPOSABLE) ×9
HEMOSTAT SURGICEL 2X14 (HEMOSTASIS) ×8 IMPLANT
IRRIGATION STRYKERFLOW (MISCELLANEOUS) ×2 IMPLANT
IRRIGATOR STRYKERFLOW (MISCELLANEOUS) ×3
KIT BASIN OR (CUSTOM PROCEDURE TRAY) ×3 IMPLANT
KIT SUCTION CATH 14FR (SUCTIONS) IMPLANT
KIT TURNOVER KIT B (KITS) ×3 IMPLANT
LOOP VESSEL SUPERMAXI WHITE (MISCELLANEOUS) IMPLANT
NDL HYPO 25GX1X1/2 BEV (NEEDLE) ×2 IMPLANT
NDL SPNL 22GX3.5 QUINCKE BK (NEEDLE) ×2 IMPLANT
NEEDLE HYPO 25GX1X1/2 BEV (NEEDLE) ×3 IMPLANT
NEEDLE SPNL 22GX3.5 QUINCKE BK (NEEDLE) ×3 IMPLANT
NS IRRIG 1000ML POUR BTL (IV SOLUTION) ×3 IMPLANT
OBTURATOR OPTICAL STANDARD 8MM (TROCAR)
OBTURATOR OPTICAL STND 8 DVNC (TROCAR)
OBTURATOR OPTICALSTD 8 DVNC (TROCAR) IMPLANT
PACK CHEST (CUSTOM PROCEDURE TRAY) ×3 IMPLANT
PAD ARMBOARD 7.5X6 YLW CONV (MISCELLANEOUS) ×6 IMPLANT
PROGEL SPRAY TIP 11IN (MISCELLANEOUS) ×3
RELOAD STAPLE 45 2.5 WHT DVNC (STAPLE) IMPLANT
RELOAD STAPLE 45 3.5 BLU DVNC (STAPLE) IMPLANT
RELOAD STAPLE 45 4.3 GRN DVNC (STAPLE) IMPLANT
RELOAD STAPLER 2.5X45 WHT DVNC (STAPLE) ×6 IMPLANT
RELOAD STAPLER 3.5X45 BLU DVNC (STAPLE) ×18 IMPLANT
RELOAD STAPLER 4.3X45 GRN DVNC (STAPLE) ×8 IMPLANT
SCISSORS LAP 5X35 DISP (ENDOMECHANICALS) IMPLANT
SEAL CANN UNIV 5-8 DVNC XI (MISCELLANEOUS) ×4 IMPLANT
SEAL XI 5MM-8MM UNIVERSAL (MISCELLANEOUS) ×6
SEALANT PROGEL (MISCELLANEOUS) ×1 IMPLANT
SEALANT SURG COSEAL 4ML (VASCULAR PRODUCTS) IMPLANT
SEALANT SURG COSEAL 8ML (VASCULAR PRODUCTS) IMPLANT
SET TUBE SMOKE EVAC HIGH FLOW (TUBING) ×3 IMPLANT
SOLUTION ELECTROLUBE (MISCELLANEOUS) ×1 IMPLANT
SPECIMEN JAR MEDIUM (MISCELLANEOUS) IMPLANT
SPONGE INTESTINAL PEANUT (DISPOSABLE) IMPLANT
SPONGE TONSIL TAPE 1 RFD (DISPOSABLE) IMPLANT
STAPLER 45 SUREFORM CVD (STAPLE) ×6
STAPLER 45 SUREFORM CVD DVNC (STAPLE) IMPLANT
STAPLER CANNULA SEAL DVNC XI (STAPLE) ×4 IMPLANT
STAPLER CANNULA SEAL XI (STAPLE) ×6
STAPLER RELOAD 2.5X45 WHITE (STAPLE) ×9
STAPLER RELOAD 2.5X45 WHT DVNC (STAPLE) ×6
STAPLER RELOAD 3.5X45 BLU DVNC (STAPLE) ×18
STAPLER RELOAD 3.5X45 BLUE (STAPLE) ×27
STAPLER RELOAD 4.3X45 GREEN (STAPLE) ×12
STAPLER RELOAD 4.3X45 GRN DVNC (STAPLE) ×8
SUT PDS AB 3-0 SH 27 (SUTURE) IMPLANT
SUT PROLENE 4 0 RB 1 (SUTURE)
SUT PROLENE 4-0 RB1 .5 CRCL 36 (SUTURE) IMPLANT
SUT SILK  1 MH (SUTURE) ×6
SUT SILK 1 MH (SUTURE) ×4 IMPLANT
SUT SILK 1 TIES 10X30 (SUTURE) ×3 IMPLANT
SUT SILK 2 0 SH (SUTURE) IMPLANT
SUT SILK 2 0SH CR/8 30 (SUTURE) IMPLANT
SUT SILK 3 0SH CR/8 30 (SUTURE) IMPLANT
SUT VIC AB 1 CTX 36 (SUTURE) ×3
SUT VIC AB 1 CTX36XBRD ANBCTR (SUTURE) IMPLANT
SUT VIC AB 2-0 CTX 36 (SUTURE) ×1 IMPLANT
SUT VIC AB 3-0 MH 27 (SUTURE) IMPLANT
SUT VIC AB 3-0 X1 27 (SUTURE) ×4 IMPLANT
SUT VICRYL 0 TIES 12 18 (SUTURE) ×3 IMPLANT
SUT VICRYL 0 UR6 27IN ABS (SUTURE) ×4 IMPLANT
SUT VICRYL 2 TP 1 (SUTURE) IMPLANT
SYR 20ML LL LF (SYRINGE) ×6 IMPLANT
SYSTEM RETRIEVAL ANCHOR 12 (MISCELLANEOUS) ×1 IMPLANT
SYSTEM SAHARA CHEST DRAIN ATS (WOUND CARE) ×3 IMPLANT
TAPE CLOTH 4X10 WHT NS (GAUZE/BANDAGES/DRESSINGS) ×3 IMPLANT
TIP APPLICATOR SPRAY EXTEND 16 (VASCULAR PRODUCTS) IMPLANT
TIP SPRAY PROGEL 11IN (MISCELLANEOUS) IMPLANT
TOWEL GREEN STERILE (TOWEL DISPOSABLE) ×6 IMPLANT
TRAY FOLEY MTR SLVR 16FR STAT (SET/KITS/TRAYS/PACK) ×3 IMPLANT
TRAY WAYNE PNEUMOTHORAX 14X18 (TRAY / TRAY PROCEDURE) ×1 IMPLANT
TROCAR BLADELESS 15MM (ENDOMECHANICALS) IMPLANT
TROCAR XCEL 12X100 BLDLESS (ENDOMECHANICALS) ×2 IMPLANT
TROCAR XCEL BLADELESS 5X75MML (TROCAR) IMPLANT
WATER STERILE IRR 1000ML POUR (IV SOLUTION) ×3 IMPLANT

## 2020-03-15 NOTE — Anesthesia Procedure Notes (Signed)
Arterial Line Insertion Start/End2/24/2022 7:30 AM, 03/15/2020 7:50 AM Performed by: Gaylene Brooks, CRNA, CRNA  Preanesthetic checklist: patient identified, IV checked, site marked, risks and benefits discussed, surgical consent, monitors and equipment checked, pre-op evaluation, timeout performed and anesthesia consent Left, radial was placed Catheter size: 20 G Hand hygiene performed  and maximum sterile barriers used   Attempts: 1 Procedure performed without using ultrasound guided technique. Following insertion, dressing applied and Biopatch. Post procedure assessment: normal  Patient tolerated the procedure well with no immediate complications.

## 2020-03-15 NOTE — Interval H&P Note (Signed)
History and Physical Interval Note:  03/15/2020 7:57 AM  Cassie Shaw  has presented today for surgery, with the diagnosis of LLL LUNG NODULE.  The various methods of treatment have been discussed with the patient and family. After consideration of risks, benefits and other options for treatment, the patient has consented to  Procedure(s): XI ROBOTIC ASSISTED THORASCOPY-LEFT LOWER LOBE WEDGE RESECTION, possible lobectomy (Left) INTERCOSTAL NERVE BLOCK (Left) LYMPH NODE DISSECTION (Bilateral) as a surgical intervention.  The patient's history has been reviewed, patient examined, no change in status, stable for surgery.  I have reviewed the patient's chart and labs.  Questions were answered to the patient's satisfaction.     Melrose Nakayama

## 2020-03-15 NOTE — Anesthesia Procedure Notes (Addendum)
Procedure Name: Intubation Date/Time: 03/15/2020 8:24 AM Performed by: Gaylene Brooks, CRNA Pre-anesthesia Checklist: Patient identified, Emergency Drugs available, Suction available and Patient being monitored Patient Re-evaluated:Patient Re-evaluated prior to induction Oxygen Delivery Method: Circle System Utilized Preoxygenation: Pre-oxygenation with 100% oxygen Induction Type: IV induction Ventilation: Mask ventilation without difficulty Laryngoscope Size: Mac and 3 Grade View: Grade II Tube type: Oral Endobronchial tube: Double lumen EBT and Left and 35 Fr Number of attempts: 1 Airway Equipment and Method: Stylet and Oral airway Placement Confirmation: ETT inserted through vocal cords under direct vision,  positive ETCO2 and breath sounds checked- equal and bilateral Secured at: 27 cm Tube secured with: Tape Dental Injury: Teeth and Oropharynx as per pre-operative assessment  Comments: Grade 2 view. AOI. Teeth unchanged from preop.

## 2020-03-15 NOTE — Anesthesia Postprocedure Evaluation (Signed)
Anesthesia Post Note  Patient: Cassie Shaw  Procedure(s) Performed: XI ROBOTIC ASSISTED THORASCOPY - LEFT LOWER LOBE LUNG LOBECTOMY (Left Chest) INTERCOSTAL NERVE BLOCK (Left Chest) LYMPH NODE DISSECTION (Bilateral Chest)     Patient location during evaluation: PACU Anesthesia Type: General Level of consciousness: awake Pain management: pain level controlled Vital Signs Assessment: post-procedure vital signs reviewed and stable Respiratory status: spontaneous breathing, nonlabored ventilation, respiratory function stable and patient connected to nasal cannula oxygen Cardiovascular status: blood pressure returned to baseline and stable Postop Assessment: no apparent nausea or vomiting Anesthetic complications: no   No complications documented.  Last Vitals:  Vitals:   03/15/20 1500 03/15/20 1940  BP: 119/65 123/70  Pulse: 82 92  Resp: 19 20  Temp:  36.8 C  SpO2: 97% 100%    Last Pain:  Vitals:   03/15/20 1940  TempSrc: Oral  PainSc:                  Karyl Kinnier Permelia Bamba

## 2020-03-15 NOTE — Transfer of Care (Signed)
Immediate Anesthesia Transfer of Care Note  Patient: Cassie Shaw  Procedure(s) Performed: XI ROBOTIC ASSISTED THORASCOPY - LEFT LOWER LOBE LUNG LOBECTOMY (Left Chest) INTERCOSTAL NERVE BLOCK (Left Chest) LYMPH NODE DISSECTION (Bilateral Chest)  Patient Location: PACU  Anesthesia Type:General  Level of Consciousness: awake, drowsy and patient cooperative  Airway & Oxygen Therapy: Patient Spontanous Breathing and Patient connected to face mask oxygen  Post-op Assessment: Report given to RN, Post -op Vital signs reviewed and stable and Patient moving all extremities X 4  Post vital signs: Reviewed and stable  Last Vitals:  Vitals Value Taken Time  BP 151/81 03/15/20 1157  Temp    Pulse 90 03/15/20 1201  Resp 12 03/15/20 1201  SpO2 100 % 03/15/20 1201  Vitals shown include unvalidated device data.  Last Pain:  Vitals:   03/15/20 0704  TempSrc:   PainSc: 0-No pain      Patients Stated Pain Goal: 3 (67/70/34 0352)  Complications: No complications documented.

## 2020-03-15 NOTE — Brief Op Note (Addendum)
03/15/2020  11:44 AM  PATIENT:  Cassie Shaw  75 y.o. 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)  PROCEDURE:  Procedure(s): XI ROBOTIC ASSISTED THORASCOPY - LEFT LOWER LOBE LUNG LOBECTOMY (Left) INTERCOSTAL NERVE BLOCK (Left) LYMPH NODE DISSECTION (Bilateral)  SURGEON:  Surgeon(s) and Role:    * Melrose Nakayama, MD - Primary  PHYSICIAN ASSISTANT:  Nicholes Rough, PA-C  ANESTHESIA:   general  EBL:  150 mL   BLOOD ADMINISTERED:none  DRAINS: one blake chest tube   LOCAL MEDICATIONS USED:  BUPIVICAINE   SPECIMEN:  Source of Specimen:  left lower lobe  DISPOSITION OF SPECIMEN:  PATHOLOGY  COUNTS:  YES  DICTATION: =-  PLAN OF CARE: Admit to inpatient   PATIENT DISPOSITION:  PACU - hemodynamically stable.   Delay start of Pharmacological VTE agent (>24hrs) due to surgical blood loss or risk of bleeding: no

## 2020-03-15 NOTE — Hospital Course (Addendum)
HPI:   Cassie Shaw is a 75 year old woman with a history of tobacco abuse (quit 2006), peripheral neuropathy, hyperlipidemia, B12 deficiency, fibromyalgia, osteoporosis, and irritable bowel syndrome.  She had a low-dose CT for lung cancer screening in October.  It showed evidence of paraseptal and centrilobular emphysema and a left lower lobe lung nodule that had cystic, groundglass, and solid components.  The equivalent diameter was 11.3 mm in the central solid component measured 6.6 mm.  A follow-up CT at 3 months was recommended.  The nodule was persistent and measured at equivalent diameter of 15.6 mm with a solid component of 9.8 mm.  A PET/CT showed mild uptake with an SUV of 2.4.   She has a 46-pack-year history of smoking prior to quitting in 2006.  She complains of a cough, usually dry but occasionally productive of clear mucus.  No hemoptysis.  Occasional wheezing.  She was given a trial inhaler but has not noticed any change with that.  Her primary complaint is pain in her feet from neuropathy.  She does take gabapentin for that.  She denies any change in appetite or weight loss.  She denies headaches or visual changes.  Hospital course:   Cassie Shaw underwent a robotic-assisted thorascopy, left lower lobectomy, and lymph node biopsies with Dr. Roxan Hockey. She tolerated the procedure well and was transferred to the cardiac stepdown unit in stable condition.  The patient's chest tube exhibited an air leak.  Her CXR showed a small apical space with bilateral atelectasis.  She was educated on the importance of good use of IS.

## 2020-03-16 ENCOUNTER — Inpatient Hospital Stay (HOSPITAL_COMMUNITY): Payer: Medicare Other

## 2020-03-16 ENCOUNTER — Encounter (HOSPITAL_COMMUNITY): Payer: Self-pay | Admitting: Thoracic Surgery (Cardiothoracic Vascular Surgery)

## 2020-03-16 LAB — CBC
HCT: 33.4 % — ABNORMAL LOW (ref 36.0–46.0)
Hemoglobin: 11.2 g/dL — ABNORMAL LOW (ref 12.0–15.0)
MCH: 30.7 pg (ref 26.0–34.0)
MCHC: 33.5 g/dL (ref 30.0–36.0)
MCV: 91.5 fL (ref 80.0–100.0)
Platelets: 169 10*3/uL (ref 150–400)
RBC: 3.65 MIL/uL — ABNORMAL LOW (ref 3.87–5.11)
RDW: 12.3 % (ref 11.5–15.5)
WBC: 11.6 10*3/uL — ABNORMAL HIGH (ref 4.0–10.5)
nRBC: 0 % (ref 0.0–0.2)

## 2020-03-16 LAB — BASIC METABOLIC PANEL
Anion gap: 13 (ref 5–15)
BUN: 9 mg/dL (ref 8–23)
CO2: 19 mmol/L — ABNORMAL LOW (ref 22–32)
Calcium: 8.2 mg/dL — ABNORMAL LOW (ref 8.9–10.3)
Chloride: 103 mmol/L (ref 98–111)
Creatinine, Ser: 0.71 mg/dL (ref 0.44–1.00)
GFR, Estimated: >60 mL/min (ref >60)
Glucose, Bld: 149 mg/dL — ABNORMAL HIGH (ref 70–99)
Potassium: 3.7 mmol/L (ref 3.5–5.1)
Sodium: 135 mmol/L (ref 135–145)

## 2020-03-16 LAB — GLUCOSE, CAPILLARY: Glucose-Capillary: 127 mg/dL — ABNORMAL HIGH (ref 70–99)

## 2020-03-16 NOTE — Op Note (Signed)
Cassie Shaw, Cassie Shaw MEDICAL RECORD NO: [id number] ACCOUNT NO: [id number] DATE OF BIRTH: 1945-09-06 FACILITY: [facility] LOCATION: [facility] PHYSICIAN: Revonda Standard. Roxan Hockey, MD  Operative Report   DATE OF PROCEDURE: 03/15/2020  PREOPERATIVE DIAGNOSIS:  Left lower lobe lung nodule.  POSTOPERATIVE DIAGNOSIS:  Adenocarcinoma, left lower lobe, clinical stage IA (T1, N0).  PROCEDURE PERFORMED:   Robotic left video-assisted thoracoscopy, Left lower lobe wedge resection, Left lower lobectomy, Lymph node dissection, Intercostal nerve blocks levels 3 through 10.  SURGEON:  Revonda Standard. Roxan Hockey, MD  ASSISTANT:  Nicholes Rough, PA.  ANESTHESIA:  General.  FINDINGS:  Nodule clearly visible in lateral aspect of inferior left lower lobe.  Frozen section revealed adenocarcinoma.  Bronchial margin negative for tumor.  CLINICAL NOTE:  Cassie Shaw is a 75 year old woman with a history of tobacco abuse, who had a nodule found on a low dose CT for lung cancer screening.  This was a cystic nodule with also ground glass and solid components.  Followup CT at 3 months showed  the nodule was persistent, and had increased in size slightly. On PET/CT, there was mild uptake with an SUV of 2.4.  She was advised to undergo surgical resection with a plan for a wedge resection to be followed by lobectomy if the nodule was a cancer on  frozen section.  The indications, risks, benefits, and alternatives were discussed in detail with the patient.  She understood and accepted the risks and agreed to proceed.  DESCRIPTION OF PROCEDURE:  Cassie Shaw was brought to the preoperative holding area on 03/15/2020.  Anesthesia placed a central venous catheter and an arterial blood pressure monitoring line.  She was taken to the operating room, anesthetized and  intubated with a double lumen endotracheal tube.  Intravenous antibiotics were administered.  Foley catheter was placed.  Sequential compression devices were placed on the calves for  DVT prophylaxis.  She was placed in a right lateral decubitus position.   Bair Hugger was placed for active warming.  The left chest was prepped and draped in the usual sterile fashion.  Single lung ventilation of the right lung was initiated and was tolerated well throughout the procedure.  A timeout was performed.  A solution containing 20 mL of liposomal bupivacaine, 30 mL of 0.5% bupivacaine and 50 mL of saline was prepared.  This was used for local at the incisions as well as for the intercostal nerve blocks.  An incision was made in  the eighth interspace in the mid axillary line.  An 8 mm robotic port was inserted.  The scope was advanced through the port.  There was good isolation of the left lung.  After confirming intrapleural placement, carbon dioxide was insufflated per  protocol.  A 12 mm robotic port was placed 5 cm anterior to the camera port.  Intercostal nerve blocks then were performed from the third to the tenth interspace.  10 mL of the bupivacaine solution was injected into a subpleural plane at each level.  Two  additional eighth interspace ports were placed, a 12-mm port was placed 5 cm posterior to the camera and then further posteriorly, an 8 mm port was placed for the retraction arm.  A 12 mm AirSeal port was placed in the tenth interspace posterolaterally.  The robotic arms were placed with thoracoscopic visualization.  The left lower lobe was inspected. On the lateral aspect of the lower lobe anteriorly and inferiorly, the nodule was evident.  A wedge resection was performed with sequential firings of the  robotic stapler using both blue and green cartridges.  The  specimen was placed into an endoscopic retrieval bag, removed, and sent for frozen section.  While awaiting those results, the lung was retracted superiorly.  The inferior ligament was divided with cautery.  All lymph nodes that were encountered during  the dissection were removed and sent as separate specimens for  permanent pathology.  The pleural reflection was divided at the hilum posteriorly and level 7  nodes were removed.  The dissection on the pulmonary artery was initiated.  The pleural  reflection was divided further superiorly and level 5 and 10 nodes were removed.  At this point, the frozen section returned showing non-small cell carcinoma.  The decision was made to proceed with a lobectomy.  The division of the fissure was begun  anteriorly with sequential firings of the stapler using blue cartridges, initially level 11 nodes were identified and were removed.  There were multiple nodes in that location.  The basilar and superior segmental branches of the lower lobe PA were  identified.  The dissection was carried out superior to the pulmonary artery trunk, from the surgical perspective.  The inferior pulmonary vein then was encircled and divided with the stapler and the basilar segmental pulmonary artery branch was encircled  and divided with a stapler.  While completing the fissure, there was some bleeding from a pulmonary artery branch.  Pressure was held on that for 10 minutes.  Additional staples were fired, completing all but the very last portion of the fissure.   Ultimately, the bleeding was determined to be coming from a subsegmental branch of the superior segmental pulmonary artery.  There was still some mild oozing from this site and a clip was placed and there was good control at that point.  The superior  segmental branch arose as a common trunk and then almost immediately split into three branches.  These were encircled and divided with the vascular cartridge on the stapler.  The lung then was retracted inferiorly, the lower lobe was retracted  inferiorly.  The robotic stapler was placed across the left lower lobe bronchus at its origin and closed.  A test inflation showed good aeration of the upper lobe.  Stapler was fired, transecting the bronchus. Vessel loops and sponges that had been   placed during the procedure were removed.  The lower lobe was placed into an endoscopic retrieval bag and brought down to the bottom of the chest.  Chest was filled with saline.  Test inflation showed some air leakage from the posterior aspect of the  fissure along the upper lobe.  This was in close proximity to a pulmonary artery branch and it was not feasible to staple that area.  The robotic instruments were removed.  The robot was undocked.  The anterior eighth interspace incision was extended to 3 cm and the  lower lobe was removed through that incision in the endoscopic retrieval bag.  It was sent for frozen section of the bronchial margin that subsequently returned with no tumor seen.  Progel was applied to the area of the upper lobe where the air leak had been  noted.  The chest was copiously irrigated with warm saline.  There appeared to be good hemostasis at all staple lines.  A 28-French Blake drain was placed through the original port incision and directed to the apex.  It was secured with a #1 silk suture.   Dual lung ventilation was resumed.  The remaining incisions were closed  in standard fashion.  Dermabond was applied.  The chest tube was placed to Pleur-evac on waterseal.  All sponge, needle and instrument counts were correct at the end of the  procedure.  The patient was extubated in the operating room and taken to the postanesthetic care unit in good condition.   SHW D: 03/15/2020 6:10:32 pm T: 03/16/2020 2:23:00 am  JOB: [id number]/ [id number]

## 2020-03-16 NOTE — Progress Notes (Addendum)
      [address]       [place] [address]             [phone]      1 Day Post-Op Procedure(s) (LRB): XI ROBOTIC ASSISTED THORASCOPY - LEFT LOWER LOBE LUNG LOBECTOMY (Left) INTERCOSTAL NERVE BLOCK (Left) LYMPH NODE DISSECTION (Bilateral)   Subjective:  Patient doing okay.  Having some expected pain, but she gets relief with pain medication.  She denies N/V.    Objective: Vital signs in last 24 hours: Temp:  [97 F (36.1 C)-98.5 F (36.9 C)] 98.2 F (36.8 C) (02/25 0341) Pulse Rate:  [74-94] 77 (02/25 0341) Cardiac Rhythm: Normal sinus rhythm (02/25 0701) Resp:  [12-20] 20 (02/25 0341) BP: (106-151)/(61-87) 109/62 (02/25 0341) SpO2:  [95 %-100 %] 95 % (02/25 0341) Arterial Line BP: (150-166)/(68-80) 154/73 (02/24 1500)  Intake/Output from previous day: 02/24 0701 - 02/25 0700 In: 1934.1 [I.V.:1584.1; IV Piggyback:350] Out: 2920 [Urine:2370; Blood:150; Chest Tube:400]  General appearance: alert, cooperative and no distress Heart: regular rate and rhythm Lungs: clear to auscultation bilaterally Abdomen: soft, non-tender; bowel sounds normal; no masses,  no organomegaly Extremities: extremities normal, atraumatic, no cyanosis or edema Wound: clean and dry  Lab Results: Recent Labs    03/13/20 1330 03/16/20 0033  WBC 6.8 11.6*  HGB 13.3 11.2*  HCT 40.6 33.4*  PLT 180 169   BMET:  Recent Labs    03/13/20 1330 03/16/20 0033  NA 138 135  K 4.1 3.7  CL 106 103  CO2 20* 19*  GLUCOSE 134* 149*  BUN 11 9  CREATININE 0.74 0.71  CALCIUM 9.1 8.2*    PT/INR:  Recent Labs    03/13/20 1330  LABPROT 12.4  INR 1.0   ABG    Component Value Date/Time   PHART 7.418 03/13/2020 1400   HCO3 23.8 03/13/2020 1400   ACIDBASEDEF 0.1 03/13/2020 1400   O2SAT 97.2 03/13/2020 1400   CBG (last 3)  Recent Labs    03/15/20 1629  GLUCAP 157*    Assessment/Plan: S/P Procedure(s) (LRB): XI ROBOTIC ASSISTED THORASCOPY - LEFT LOWER LOBE LUNG  LOBECTOMY (Left) INTERCOSTAL NERVE BLOCK (Left) LYMPH NODE DISSECTION (Bilateral)  1. CV- NSR, BP WNL 2. Pulm- CT with air leak on water seal, 350 cc output. CXR with trace apical space, instructed via nursing on proper use of IS... air leak is most likely from leaking around chest tube, it was minimal when pressure was applied to site... New dressing and xeroform gauze placed, repeat CXR in AM 3. Renal- creatinine, lytes okay.. will stop IV fluids 4. Expected post operative blood loss anemia, mild at 11.2, on Lovenox for DVT prophylaxis 5. D/C Foley catheter later today once patient up and moving around 6. Dispo- patient stable, leave chest tube to water seal today, repeat CXR in AM, Lovenox for DVT prophylaxis, normal post operative progression   LOS: 1 day    Ellwood Handler, PA-C  03/16/2020 Patient seen and examined, agree with assessment and plan as outlined above She does have a small space on CXR - will place tube to suction today  Remo Lipps C. Roxan Hockey, MD [facility] [phone]

## 2020-03-16 NOTE — Discharge Summary (Addendum)
[address]       [place] [address]             [phone]     Physician Discharge Summary  Patient ID: Cassie Shaw MRN: [id number] DOB/AGE: [birth year] 76 y.o.  Admit date: 03/15/2020 Discharge date: 03/20/2020  Admission Diagnoses:Lung nodule  Patient Active Problem List   Diagnosis Date Noted  . Ex-smoker 06/28/2017  . Advanced care planning/counseling discussion 06/28/2017  . Abnormal TSH 06/28/2017  . COPD (chronic obstructive pulmonary disease) ([place]) 06/26/2017  . Thoracic aortic atherosclerosis ([place]) 06/26/2017  . Health maintenance examination 06/25/2017  . Vertigo 03/13/2016  . Osteoporosis   . Fibromyalgia   . Fibrocystic breast disease   . Vitamin B12 deficiency   . Foot pain, bilateral 11/14/2014  . IBS (irritable bowel syndrome)   . Dyslipidemia   . Lower back pain     Patient Active Problem List   Diagnosis Date Noted  . Squamous cell lung cancer, left ([place]) 03/20/2020  . S/P robot-assisted surgical procedure 03/15/2020  . Ex-smoker 06/28/2017  . Advanced care planning/counseling discussion 06/28/2017  . Abnormal TSH 06/28/2017  . COPD (chronic obstructive pulmonary disease) ([place]) 06/26/2017  . Thoracic aortic atherosclerosis ([place]) 06/26/2017  . Health maintenance examination 06/25/2017  . Vertigo 03/13/2016  . Osteoporosis   . Fibromyalgia   . Fibrocystic breast disease   . Vitamin B12 deficiency   . Foot pain, bilateral 11/14/2014  . IBS (irritable bowel syndrome)   . Dyslipidemia   . Lower back pain     Discharge Diagnoses:  Active Problems:   S/P robot-assisted surgical procedure   Squamous cell lung cancer, left ([place])   Discharged Condition: good  HPI:   Cassie Shaw is a 75 year old woman with a history of tobacco abuse (quit 2006), peripheral neuropathy, hyperlipidemia, B12 deficiency, fibromyalgia, osteoporosis, and irritable bowel syndrome.  She had a low-dose CT for lung cancer screening in October.  It  showed evidence of paraseptal and centrilobular emphysema and a left lower lobe lung nodule that had cystic, groundglass, and solid components.  The equivalent diameter was 11.3 mm in the central solid component measured 6.6 mm.  A follow-up CT at 3 months was recommended.  The nodule was persistent and measured at equivalent diameter of 15.6 mm with a solid component of 9.8 mm.  A PET/CT showed mild uptake with an SUV of 2.4.   She has a 46-pack-year history of smoking prior to quitting in 2006.  She complains of a cough, usually dry but occasionally productive of clear mucus.  No hemoptysis.  Occasional wheezing.  She was given a trial inhaler but has not noticed any change with that.  Her primary complaint is pain in her feet from neuropathy.  She does take gabapentin for that.  She denies any change in appetite or weight loss.  She denies headaches or visual changes.  Hospital course:   [name] underwent a robotic-assisted thorascopy, left lower lobectomy, and lymph node biopsies with Dr. Roxan Hockey. She tolerated the procedure well and was transferred to the cardiac stepdown unit in stable condition.  The patient's chest tube exhibited an air leak.  Her CXR showed a small apical space with bilateral atelectasis.  She was educated on the importance of good use of IS.  She was placed back to suction. Her chest tube was switched to water seal over time  and  Was continued to be followed. Follow-up chest xray showed stable small  apical pneumothorax.Chest tube was clamped overnight on 03/19/20.  Her chest tube/CXR  was stable to remove tube  on 03/20/20.  She does have a mild expected acute blood loss anemia which is stable.  Most recent hemoglobin hematocrit dated 03/17/2020 are 10.6/32.3 respectively.  Renal function is stable.  Pain is under adequate control.  Today, she is tolerating room air, her incisions are healing well, she is ambulating with limited assistance, and she is ready for discharge. Her  chest tube sutures will be removed during her follow-up appointment.    Consults: None  Significant Diagnostic Studies:   CLINICAL DATA:  Chest pain.  Chest tube present.  EXAM: PORTABLE CHEST 1 VIEW  COMPARISON:  March 15, 2020 and March 13, 2020  FINDINGS: Chest tube remains on the left with stable left apical pneumothorax. No tension component. Ill-defined airspace opacity in the left lower lobe region may represent combination of atelectasis and pleural effusion. Lungs elsewhere are clear. Heart size and pulmonary vascularity normal. No adenopathy. There is aortic atherosclerosis. No bone lesions.  IMPRESSION: Postoperative change on the left with stable positioning of left chest tube. Apical pneumothorax is stable without tension component. There is new ill-defined airspace opacity in the left base region, likely due to combination of effusion and atelectasis. Early pneumonia in this area cannot be excluded by radiography. Right lung clear. Stable cardiac silhouette. Aortic Atherosclerosis (ICD10-I70.0).   Electronically Signed   By: Lowella Grip III M.D.   On: 03/16/2020 08:04  Treatments:   NAME: ROSSANNA, SPITZLEY MEDICAL RECORD NO: [id number] ACCOUNT NO: [id number] DATE OF BIRTH: 03-07-1945 FACILITY: [facility] LOCATION: [facility] PHYSICIAN: Revonda Standard. Roxan Hockey, MD  Operative Report   DATE OF PROCEDURE: 03/15/2020  PREOPERATIVE DIAGNOSIS:  Left lower lobe lung nodule.  POSTOPERATIVE DIAGNOSIS:  Adenocarcinoma, left lower lobe, clinical stage IA (T1, N0).  PROCEDURE PERFORMED:  Robotic left video-assisted thoracoscopy, left lower lobe wedge resection, left lower lobectomy, lymph node dissection, intercostal nerve blocks levels 3 through 10.  SURGEON:  Revonda Standard. Roxan Hockey, MD  ASSISTANT:  Nicholes Rough.  ANESTHESIA:  General.   Discharge Exam: Blood pressure 122/69, pulse 82, temperature 97.8 F (36.6 C), temperature source Oral,  resp. rate 18, height 5\' 4"  (1.626 m), weight 62 kg, SpO2 98 %.    General appearance: alert, cooperative and no distress Heart: regular rate and rhythm Lungs: fair air movement throughout, SQ air appears stable Abdomen: benign Extremities: no calf tenderness or edema Wound: incis healing well Disposition: Discharge disposition: 01-Home or Self Care       Discharge Instructions    Discharge patient   Complete by: As directed    Discharge disposition: 01-Home or Self Care   Discharge patient date: 03/20/2020      SURGICAL PATHOLOGY SURGICAL PATHOLOGY  CASE: [id number]  PATIENT: Maysoon Harbeson  Surgical Pathology Report      Clinical History: LLL lung nodule (cm)      FINAL MICROSCOPIC DIAGNOSIS:   A. LUNG, LEFT LOWER LOBE, WEDGE RESECTION:  - Squamous cell carcinoma, 1.6 cm.  - Margins not involved.  - Peri-bronchial lymph node with no metastatic carcinoma.  - See oncology table.   B. LUNG, LEFT LOWER LOBE, LOBECTOMY:  - Previous wedge biopsy site.  - Emphysematous changes.  - No residual carcinoma.  - Margins not involved by carcinoma.   C. LYMPH NODE, LEVEL 9, EXCISION:  - Benign lymph node (0/1).   D. LYMPH NODE, LEVEL 7, EXCISION:  - Benign lymph node (0/1).  E. LYMPH NODE, LEVEL 5, EXCISION:  - Benign lymph node (0/1).   F. LYMPH NODE, LEVEL 10, EXCISION:  - Benign lymph node (0/1).   G. LYMPH NODE, LEVEL 11, EXCISION:  - Benign lymph node (0/1).   H. LYMPH NODE, LEVEL 11 #2, EXCISION:  - Benign lymph node (0/1).   I. LYMPH NODE, LEVEL 12, EXCISION:  - Benign lymph node (0/1).   J. LYMPH NODE, LEVEL 12 #2, EXCISION:  - Benign lymph node (0/1).   K. LYMPH NODE, LEVEL 12 #3, EXCISION:  - Benign lymph node (0/1).   ONCOLOGY TABLE:  LUNG: Resection  Procedure: Left lower lobe wedge and lobectomy and multiple lymph node  biopsies.  Specimen Laterality: Left lower lobe.  Tumor Focality: Unifocal.  Tumor Site: Left lower lobe.  Tumor  Size: 1.6 x 1.5 x 0.8 cm.  Histologic Type: Squamous cell carcinoma.  Visceral Pleura Invasion: Not identified.  Lymphovascular Invasion: Not identified.  Margins: Margins not involved by carcinoma.  Treatment Effect: No known presurgical therapy.  Regional Lymph Nodes:    Number of Lymph Nodes Involved: 0    Number of Lymph Nodes Examined: 10  Pathologic Stage Classification (pTNM, AJCC 8th Edition): pT1b, pN0  Ancillary Studies: Can be performed if requested.  Representative Tumor Block: A2 and A3.  (v4.2.0.1)   INTRAOPERATIVE DIAGNOSIS:  A. Left lower lobe lung wedge resection: "Nodule: non-small carcinoma"  Intraoperative diagnosis rendered by Dr. Melina Copa at 9:47 AM on 03/15/2020.   B. Left lower lobe lung lobectomy: "Bronchial margin: Benign"  Intraoperative diagnosis rendered by Dr. Melina Copa at 11:33 AM on  03/15/2020.   GROSS DESCRIPTION:  A: The specimen is received fresh for rapid intraoperative consultation,  and consists of a 7.6 x 4.4 x 2.0 cm wedge of lung parenchyma, with a  weight of 22 g. The pleural surface is tan-pink to purple, smooth, with  stable resection margin. A suture is present designating the area of  interest. The overlying pleural surface is inked black, the specimen is  sectioned to reveal tan-pink spongy parenchyma. Underlying the suture  material is a 1.6 x 1.5 x 0.8 cm tan-red, focally indurated cystic  possible lesion. A representative section is submitted for frozen  section analysis. Representative sections are submitted in 5 cassettes.  1 = frozen section remnant  2-4 = additional sections of area of interest  5 = grossly unremarkable parenchyma   B: Specimen: Received freshfor rapid intraoperative consultation  labeled left lower lobe lobectomy  Specimen integrity: Intact, with multiple staple lines  Size, weight: 12.6 x 9.5 x 3.6 cm, 147 g  Pleura: Pink-purple, smooth, with moderate anthracosis. There is a  single 0.2 cm  tan-white firm nodule present on the pleural surface.  Lesion: Identified along the inferior aspect of the specimen is a staple  line, which upon removal displays a red-brown, hemorrhagic parenchyma,  grossly consistent with previous wedge biopsy site.  Margin(s): Hemorrhagic parenchyma measures 2.8 cm from the bronchial  resection margin. Bronchial margins are submitted for frozen section  analysis.  Hilar vessels: Grossly unremarkable  Nonneoplastic parenchyma: Spongy tan-red, with mild f  Lymph nodes: None identified  Block Summary:  9 blocks submitted  1 = bronchial margin frozen section remnant  2 = vascular margins  3 = pleural nodules  4-7 = sections of hemorrhagic parenchyma at possible wedge biopsy site  8 = hilum closest tohemorrhagic tissue  9 = grossly unremarkable parenchyma   C: The specimen is received fresh and consists  of a 0.9 x 0.5 x 0.3 cm  tan-gray, anthracotic possible lymph node. The specimen is entirely  submitted in 1 cassette.   D: Specimen is received fresh and consists of a 0.8 x 0.5 x 0.3 cm  tan-gray, anthracotic possible lymph node. The specimen is entirely  submitted in 1 cassette.   E: The specimen is received fresh and consists of a 1.5 x 0.9 x 0.5 cm  disrupted tan-gray, anthracotic possible lymph node. The specimen is  entirely submitted in 1 cassette.   F: The specimen is received fresh and consists of a 0.8 x 0.5 x 0.3 cm  tan-gray, anthracotic possible lymph node. The specimen is entirely  submitted in 1 cassette.   G: The specimen is received fresh and consists of a 0.4 cm tan-gray,  anthracotic possible lymph node. The specimen is entirely submitted in  1 cassette.   H: The specimen is received fresh and consists of a 1.2 x 1.0 x 0.4 cm  aggregate of tan-gray, anthracotic soft tissue. The specimen is  entirely submitted in 1 cassette.   I: The specimen is received fresh and consists of a 0.7 x 0.5 x 0.4 cm  tan-gray,  anthracotic possible lymph node. The specimen is entirely  submitted in 1 cassette.   J: The specimen is received fresh and consists of a 0.9 x 0.8 x 0.2 cm  aggregate of tan-gray, anthracotic soft tissue. The specimen is  entirely submitted in 1 cassette.   K: The specimen is received fresh and consists of a 0.9 x 0.7 x 0.2 cm  aggregate of tan-gray, anthracotic soft tissue. The specimen is  entirely submitted in 1 cassette. Craig Staggers 03/16/2020)   Final Diagnosis performed by Claudette Laws, MD.  Electronically signed  03/19/2020  Technical component performed at [facility]. [facility], [place] [address], [place], [place] [address].  Professional component performed at [facility],  [place] [address] [place], [place] [address].  Immunohistochemistry Technical component (if applicable) was performed  at [facility]. [address], [place],  [place], [place] [address].  IMMUNOHISTOCHEMISTRY DISCLAIMER (if applicable):  Some of these immunohistochemical stains may have been developed and the  performance characteristics determine by [facility]. Some  may not have been cleared or approved by the U.S. Food and Drug  Administration. The FDA has determined that such clearance or approval  is not necessary. This test is used for clinical purposes. It should not  be regarded as investigational or for research. This laboratory is  certified under the [facility]  (CLIA-88) as qualified to perform high complexity clinical laboratory  testing. The controls stained appropriately.     Allergies as of 03/20/2020   No Known Allergies     Medication List    STOP taking these medications   naproxen sodium 220 MG tablet Commonly known as: ALEVE   nortriptyline 50 MG capsule Commonly known as: PAMELOR     TAKE these medications   alendronate 70 MG tablet Commonly known as: FOSAMAX TAKE 1 TABLET  BY MOUTH ONE TIME PER WEEK What changed: See the new instructions.   Calcium-Vitamin D 600-400 MG-UNIT Tabs Take 1 tablet by mouth daily.   gabapentin 600 MG tablet Commonly known as: NEURONTIN TAKE 1 TABLET BY MOUTH TWICE A DAY What changed:   when to take this  reasons to take this   ibuprofen 200 MG tablet Commonly known as: ADVIL Take 200 mg  by mouth 2 (two) times daily as needed for moderate pain.   lovastatin 40 MG tablet Commonly known as: MEVACOR Take 1 tablet (40 mg total) by mouth at bedtime. What changed: See the new instructions.   multivitamin tablet Take 1 tablet by mouth daily.   traMADol 50 MG tablet Commonly known as: ULTRAM Take 1-2 tablets (50-100 mg total) by mouth every 6 (six) hours as needed for up to 7 days for moderate pain.   vitamin B-12 1000 MCG tablet Commonly known as: CYANOCOBALAMIN Take 1 tablet (1,000 mcg total) by mouth every Monday, Wednesday, and Friday.   Vitamin D3 25 MCG (1000 UT) Caps Take 1 capsule (1,000 Units total) by mouth daily.       Follow-up Information    Melrose Nakayama, MD Follow up.   Specialty: Cardiothoracic Surgery Why: Your appointment is on 3/8 @10 :00am. Please arrive at 9:30am for a chest xray located at [facility] which is on the first floor of our building.  Contact information: [address] [place] [address] [phone]        Ria Bush, MD. Call in 1 day(s).   Specialty: Family Medicine Contact information: [place] [address] [phone]               Signed: John Giovanni PA-C 03/20/2020, 10:48 AM

## 2020-03-17 ENCOUNTER — Inpatient Hospital Stay (HOSPITAL_COMMUNITY): Payer: Medicare Other

## 2020-03-17 LAB — CBC
HCT: 32.3 % — ABNORMAL LOW (ref 36.0–46.0)
Hemoglobin: 10.6 g/dL — ABNORMAL LOW (ref 12.0–15.0)
MCH: 29.9 pg (ref 26.0–34.0)
MCHC: 32.8 g/dL (ref 30.0–36.0)
MCV: 91.2 fL (ref 80.0–100.0)
Platelets: 170 10*3/uL (ref 150–400)
RBC: 3.54 MIL/uL — ABNORMAL LOW (ref 3.87–5.11)
RDW: 12.4 % (ref 11.5–15.5)
WBC: 8.7 10*3/uL (ref 4.0–10.5)
nRBC: 0 % (ref 0.0–0.2)

## 2020-03-17 LAB — COMPREHENSIVE METABOLIC PANEL
ALT: 13 U/L (ref 0–44)
AST: 25 U/L (ref 15–41)
Albumin: 3.1 g/dL — ABNORMAL LOW (ref 3.5–5.0)
Alkaline Phosphatase: 15 U/L — ABNORMAL LOW (ref 38–126)
Anion gap: 8 (ref 5–15)
BUN: 11 mg/dL (ref 8–23)
CO2: 25 mmol/L (ref 22–32)
Calcium: 8.6 mg/dL — ABNORMAL LOW (ref 8.9–10.3)
Chloride: 107 mmol/L (ref 98–111)
Creatinine, Ser: 0.68 mg/dL (ref 0.44–1.00)
GFR, Estimated: >60 mL/min (ref >60)
Glucose, Bld: 111 mg/dL — ABNORMAL HIGH (ref 70–99)
Potassium: 3.6 mmol/L (ref 3.5–5.1)
Sodium: 140 mmol/L (ref 135–145)
Total Bilirubin: 0.8 mg/dL (ref 0.3–1.2)
Total Protein: 6.1 g/dL — ABNORMAL LOW (ref 6.5–8.1)

## 2020-03-17 NOTE — Progress Notes (Addendum)
      [address]       [place] [address]             [phone]      2 Days Post-Op Procedure(s) (LRB): XI ROBOTIC ASSISTED THORASCOPY - LEFT LOWER LOBE LUNG LOBECTOMY (Left) INTERCOSTAL NERVE BLOCK (Left) LYMPH NODE DISSECTION (Bilateral)   Subjective:   Patient yelling out in room.  States she is in pain and needs a pain pill.  She states she is unable to cough or deep breath during exam due to pain.  Per nurse she has already ambulated in the hallway this morning.  Objective: Vital signs in last 24 hours: Temp:  [98 F (36.7 C)-98.3 F (36.8 C)] 98.3 F (36.8 C) (02/26 0816) Pulse Rate:  [78-91] 82 (02/26 0816) Cardiac Rhythm: Normal sinus rhythm (02/26 0700) Resp:  [15-22] 19 (02/26 0816) BP: (110-131)/(61-80) 112/63 (02/26 0816) SpO2:  [93 %-100 %] 99 % (02/26 0816)  Intake/Output from previous day: 02/25 0701 - 02/26 0700 In: 20 [P.O.:20] Out: 855 [Urine:625; Chest Tube:230] Intake/Output this shift: Total I/O In: 120 [P.O.:120] Out: 10 [Chest Tube:10]  General appearance: alert, cooperative and mild distress Heart: regular rate and rhythm Lungs: clear to auscultation bilaterally Abdomen: soft, non-tender; bowel sounds normal; no masses,  no organomegaly Extremities: extremities normal, atraumatic, no cyanosis or edema Wound: clean and dry  Lab Results: Recent Labs    03/16/20 0033 03/17/20 0124  WBC 11.6* 8.7  HGB 11.2* 10.6*  HCT 33.4* 32.3*  PLT 169 170   BMET:  Recent Labs    03/16/20 0033 03/17/20 0124  NA 135 140  K 3.7 3.6  CL 103 107  CO2 19* 25  GLUCOSE 149* 111*  BUN 9 11  CREATININE 0.71 0.68  CALCIUM 8.2* 8.6*    PT/INR: No results for input(s): LABPROT, INR in the last 72 hours. ABG    Component Value Date/Time   PHART 7.418 03/13/2020 1400   HCO3 23.8 03/13/2020 1400   ACIDBASEDEF 0.1 03/13/2020 1400   O2SAT 97.2 03/13/2020 1400   CBG (last 3)  Recent Labs    03/15/20 1629 03/16/20 0813   GLUCAP 157* 127*    Assessment/Plan: S/P Procedure(s) (LRB): XI ROBOTIC ASSISTED THORASCOPY - LEFT LOWER LOBE LUNG LOBECTOMY (Left) INTERCOSTAL NERVE BLOCK (Left) LYMPH NODE DISSECTION (Bilateral)  1. CV- hemodynamically stable in NSR 2. Pulm- CXR remains stable, patient did not cough well, no air leak present on suction... possibly move to water seal today will discuss with Dr. Kipp Brood 3. Renal- creatinine, lytes remain stable 4. Lovenox for DVT prophylaxis 5. Dispo- patient with increased pain this morning, unwilling to try to cough or deep breathe stating pain is too bad, CT remains on suction with stable CXR with apical space, will discuss further management with Dr. Kipp Brood   LOS: 2 days    Ellwood Handler, PA-C 03/17/2020   No events Will transition to [place] today Continue pulm toilet, and pain control  Stein Windhorst O Gayland Nicol

## 2020-03-18 ENCOUNTER — Inpatient Hospital Stay (HOSPITAL_COMMUNITY): Payer: Medicare Other

## 2020-03-18 NOTE — Progress Notes (Addendum)
      [address]       [place] [address]             [phone]      3 Days Post-Op Procedure(s) (LRB): XI ROBOTIC ASSISTED THORASCOPY - LEFT LOWER LOBE LUNG LOBECTOMY (Left) INTERCOSTAL NERVE BLOCK (Left) LYMPH NODE DISSECTION (Bilateral)   Subjective:  Doing better than yesterday.  Complains of pain at IV site in right forearm  Objective: Vital signs in last 24 hours: Temp:  [97.7 F (36.5 C)-98.5 F (36.9 C)] 98 F (36.7 C) (02/27 0845) Pulse Rate:  [78-89] 89 (02/27 0845) Cardiac Rhythm: Normal sinus rhythm (02/27 0700) Resp:  [20-22] 20 (02/27 0845) BP: (124-143)/(70-93) 133/75 (02/27 0845) SpO2:  [97 %-100 %] 100 % (02/27 0845)   General appearance: alert, cooperative and no distress Heart: regular rate and rhythm Lungs: clear to auscultation bilaterally Abdomen: soft, non-tender; bowel sounds normal; no masses,  no organomegaly Wound: clean and dry  Lab Results: Recent Labs    03/16/20 0033 03/17/20 0124  WBC 11.6* 8.7  HGB 11.2* 10.6*  HCT 33.4* 32.3*  PLT 169 170   BMET:  Recent Labs    03/16/20 0033 03/17/20 0124  NA 135 140  K 3.7 3.6  CL 103 107  CO2 19* 25  GLUCOSE 149* 111*  BUN 9 11  CREATININE 0.71 0.68  CALCIUM 8.2* 8.6*    PT/INR: No results for input(s): LABPROT, INR in the last 72 hours. ABG    Component Value Date/Time   PHART 7.418 03/13/2020 1400   HCO3 23.8 03/13/2020 1400   ACIDBASEDEF 0.1 03/13/2020 1400   O2SAT 97.2 03/13/2020 1400   CBG (last 3)  Recent Labs    03/15/20 1629 03/16/20 0813  GLUCAP 157* 127*    Assessment/Plan: S/P Procedure(s) (LRB): XI ROBOTIC ASSISTED THORASCOPY - LEFT LOWER LOBE LUNG LOBECTOMY (Left) INTERCOSTAL NERVE BLOCK (Left) LYMPH NODE DISSECTION (Bilateral)  1. CV- hemodynamically stable in NSR 2. Pulm- CXR remains stable with left apical space.. CT on water seal with very intermittent 1+ air leak,  3. Lovenox for DVT prophylaxis 4. Dispo- patient stable, CT  with intermittent 1+ air leak, leave on water seal today,  d/c right forearm IV   LOS: 3 days    Ellwood Handler, PA-C 03/18/2020   Agree with above CXR stable on WS Very small intermittent leak with cough Will consider clamping trial tomorrow Continue pulm toilet  Alyjah Lovingood O Tarrin Menn

## 2020-03-19 ENCOUNTER — Inpatient Hospital Stay (HOSPITAL_COMMUNITY): Payer: Medicare Other

## 2020-03-19 LAB — SURGICAL PATHOLOGY

## 2020-03-19 NOTE — Plan of Care (Signed)

## 2020-03-19 NOTE — Progress Notes (Addendum)
[address]       [place] [address]             [phone]      4 Days Post-Op Procedure(s) (LRB): XI ROBOTIC ASSISTED THORASCOPY - LEFT LOWER LOBE LUNG LOBECTOMY (Left) INTERCOSTAL NERVE BLOCK (Left) LYMPH NODE DISSECTION (Bilateral) Subjective:  c/o cough, some associated pain  Objective: Vital signs in last 24 hours: Temp:  [98 F (36.7 C)-98.7 F (37.1 C)] 98.7 F (37.1 C) (02/28 0152) Pulse Rate:  [85-89] 85 (02/28 0152) Cardiac Rhythm: Normal sinus rhythm (02/28 0700) Resp:  [17-20] 17 (02/28 0152) BP: (113-139)/(58-88) 131/58 (02/28 0152) SpO2:  [97 %-100 %] 99 % (02/28 0152)  Hemodynamic parameters for last 24 hours:    Intake/Output from previous day: 02/27 0701 - 02/28 0700 In: -  Out: 80 [Chest Tube:80] Intake/Output this shift: No intake/output data recorded.  General appearance: alert, cooperative and no distress Heart: regular rate and rhythm Lungs: + SQ air, fair air exchange throughout Abdomen: benign Extremities: no edema or calf tenderness Wound: incis healing well  Lab Results: Recent Labs    03/17/20 0124  WBC 8.7  HGB 10.6*  HCT 32.3*  PLT 170   BMET:  Recent Labs    03/17/20 0124  NA 140  K 3.6  CL 107  CO2 25  GLUCOSE 111*  BUN 11  CREATININE 0.68  CALCIUM 8.6*    PT/INR: No results for input(s): LABPROT, INR in the last 72 hours. ABG    Component Value Date/Time   PHART 7.418 03/13/2020 1400   HCO3 23.8 03/13/2020 1400   ACIDBASEDEF 0.1 03/13/2020 1400   O2SAT 97.2 03/13/2020 1400   CBG (last 3)  Recent Labs    03/16/20 0813  GLUCAP 127*    Meds Scheduled Meds: . acetaminophen  1,000 mg Oral Q6H   Or  . acetaminophen (TYLENOL) oral liquid 160 mg/5 mL  1,000 mg Oral Q6H  . bisacodyl  10 mg Oral Daily  . Chlorhexidine Gluconate Cloth  6 each Topical Daily  . enoxaparin (LOVENOX) injection  40 mg Subcutaneous Daily  . pravastatin  40 mg Oral q1800  . senna-docusate  1 tablet Oral  QHS   Continuous Infusions: PRN Meds:.fentaNYL (SUBLIMAZE) injection, gabapentin, melatonin, ondansetron (ZOFRAN) IV, oxyCODONE  Xrays DG CHEST PORT 1 VIEW  Result Date: 03/18/2020 CLINICAL DATA:  Status post partial left lung resection. EXAM: PORTABLE CHEST 1 VIEW COMPARISON:  03/17/2020 FINDINGS: Mild left-sided volume loss is again identified. Surgical staple line noted at the left apex. Small left apical pneumothorax is unchanged. Large bore left chest tube is unchanged. Right lung is clear. No pneumothorax or pleural effusion on the right. Cardiac size within normal limits. IMPRESSION: Left chest tube in place.  Stable small left apical pneumothorax. Electronically Signed   By: Fidela Salisbury MD   On: 03/18/2020 05:22    Assessment/Plan: S/P Procedure(s) (LRB): XI ROBOTIC ASSISTED THORASCOPY - LEFT LOWER LOBE LUNG LOBECTOMY (Left) INTERCOSTAL NERVE BLOCK (Left) LYMPH NODE DISSECTION (Bilateral)  1 afeb, VSS 2 sinus rhythm, sinus tachy, rare PVC's 3 sats good on RA 4 CXR appears stable 6 CT - no def air leak, will clamp tube and repeat CXR at 10:00, then poss remove tube 7 no new labs 8 lovenox for DVT PPx 9 poss home 1-2 days  LOS: 4 days    John Giovanni PA-C Pager [phone] 03/19/2020 Patient seen and examined, agree with above Path still  pending  Revonda Standard. Roxan Hockey, MD [facility] [phone]

## 2020-03-19 NOTE — Care Management Important Message (Signed)
Important Message  Patient Details  Name: Cassie Shaw MRN: [id number] Date of Birth: [birth year]   Medicare Important Message Given:  Yes     Orbie Pyo 03/19/2020, 2:47 PM

## 2020-03-20 ENCOUNTER — Telehealth: Payer: Self-pay

## 2020-03-20 ENCOUNTER — Encounter: Payer: Self-pay | Admitting: Family Medicine

## 2020-03-20 ENCOUNTER — Inpatient Hospital Stay (HOSPITAL_COMMUNITY): Payer: Medicare Other

## 2020-03-20 DIAGNOSIS — C3492 Malignant neoplasm of unspecified part of left bronchus or lung: Secondary | ICD-10-CM | POA: Insufficient documentation

## 2020-03-20 DIAGNOSIS — Z85118 Personal history of other malignant neoplasm of bronchus and lung: Secondary | ICD-10-CM | POA: Insufficient documentation

## 2020-03-20 MED ORDER — LOVASTATIN 40 MG PO TABS: 40.0000 mg | ORAL_TABLET | Freq: Every day | ORAL | Status: DC

## 2020-03-20 MED ORDER — TRAMADOL HCL 50 MG PO TABS: 50.0000 mg | ORAL_TABLET | Freq: Four times a day (QID) | ORAL | 0 refills | Status: DC | PRN

## 2020-03-20 MED ORDER — TRAMADOL HCL 50 MG PO TABS
50.0000 mg | ORAL_TABLET | Freq: Four times a day (QID) | ORAL | Status: DC | PRN
  Filled 2020-03-20: qty 1

## 2020-03-20 MED ORDER — TRAMADOL HCL 50 MG PO TABS
50.0000 mg | ORAL_TABLET | Freq: Four times a day (QID) | ORAL | Status: DC | PRN
  Administered 2020-03-20: 50 mg via ORAL

## 2020-03-20 NOTE — Progress Notes (Signed)
      [address]       [place] [address]             [phone]     Chest tube removed per routine technique. Patient tolerated well. No CXR necessary at this time   John Giovanni, PA-C

## 2020-03-20 NOTE — Progress Notes (Addendum)
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  General appearance: alert, cooperative and no distress Heart: regular rate and rhythm Lungs: fair air movement throughout, SQ air appears stable Abdomen: benign Extremities: no calf tenderness or edema Wound: incis healing well  Lab Results: No results for input(s): WBC, HGB, HCT, PLT in the last 72 hours. BMET: No results for input(s): NA, K, CL, CO2, GLUCOSE, BUN, CREATININE, CALCIUM in the last 72 hours.  PT/INR: No results for input(s): LABPROT, INR in the last 72 hours. ABG    Component Value Date/Time   PHART 7.418 03/13/2020 1400   HCO3 23.8 03/13/2020 1400   ACIDBASEDEF 0.1 03/13/2020 1400   O2SAT 97.2 03/13/2020 1400   CBG (last 3)  No results for input(s): GLUCAP in the last 72 hours.  Meds Scheduled Meds: . acetaminophen  1,000 mg Oral Q6H   Or  . acetaminophen (TYLENOL) oral liquid 160 mg/5 mL  1,000 mg Oral Q6H  . bisacodyl  10 mg Oral Daily  . Chlorhexidine Gluconate Cloth  6 each Topical Daily  . enoxaparin (LOVENOX) injection  40 mg Subcutaneous Daily  . pravastatin  40 mg Oral q1800  .  senna-docusate  1 tablet Oral QHS   Continuous Infusions: PRN Meds:.fentaNYL (SUBLIMAZE) injection, gabapentin, melatonin, ondansetron (ZOFRAN) IV, oxyCODONE  Xrays DG Chest Port 1 View  Result Date: 03/19/2020 CLINICAL DATA:  Pneumothorax EXAM: PORTABLE CHEST 1 VIEW COMPARISON:  03/19/2020 FINDINGS: Left chest tube remains in place. Small residual left apical pneumothorax and subcutaneous emphysema in the left chest wall, unchanged. No confluent opacities. Heart is normal size. IMPRESSION: Left chest tube remains in place with small residual left apical pneumothorax and left subcutaneous emphysema, unchanged. Electronically Signed   By: Rolm Baptise M.D.   On: 03/19/2020 10:47   DG CHEST PORT 1 VIEW  Result Date: 03/19/2020 CLINICAL DATA:  75 year old female with history of left lower lobectomy. Follow-up study. EXAM: PORTABLE CHEST 1 VIEW COMPARISON:  Chest x-ray 03/18/2020. FINDINGS: Postoperative changes of left lower lobectomy are noted with compensatory hyperexpansion of the left upper lobe. Left-sided chest tube remains in position with tip in the apex of the left hemithorax. Small residual apical pneumothorax, slightly decreased in size compared to the prior examination. Lungs are well aerated bilaterally. No pleural effusions. No evidence of pulmonary edema. Heart size is normal. Upper mediastinal contours are within normal limits. Extensive subcutaneous emphysema in the left chest wall again noted. IMPRESSION: 1. Status post left lower lobectomy with stable position of left-sided chest tube and slight decreased size of small left apical pneumothorax. Electronically Signed  By: Vinnie Langton M.D.   On: 03/19/2020 08:12    Assessment/Plan: S/P Procedure(s) (LRB): XI ROBOTIC ASSISTED THORASCOPY - LEFT LOWER LOBE LUNG LOBECTOMY (Left) INTERCOSTAL NERVE BLOCK (Left) LYMPH NODE DISSECTION (Bilateral)  1 afeb, VSS, SR, Sinus tach(rare) 2 sats good on RA 3 CXR appears stable to my  reading with CT clamped, radiologist read pending 4 CT - clamped, sero-sang drainage in tubing- prob d/c today, will d/w MD 5 no new labs   LOS: 5 days    John Giovanni PA-C Pager [phone] 03/20/2020  Clamped overnight with no issues No air leak after unclamped, CXR unchanged Dc chest tube Home later today if CXR ok after CT removed  Remo Lipps C. Roxan Hockey, MD [facility] [phone]  DC chest tube

## 2020-03-20 NOTE — Telephone Encounter (Signed)
Transition Care Management Follow-up Telephone Call  Date of discharge and from where: [facility]/20/[facility]  How have you been since you were released from the hospital? Patient is doing very well per her husband [name] (HIPAA verified).   Any questions or concerns? No  Items Reviewed:  Did the pt receive and understand the discharge instructions provided? Yes   Medications obtained and verified? Yes   Other? No   Any new allergies since your discharge? No   Dietary orders reviewed? Yes  Do you have support at home? Yes   Home Care and Equipment/Supplies: Were home health services ordered? not applicable If so, what is the name of the agency? N/A  Has the agency set up a time to come to the patient's home? not applicable Were any new equipment or medical supplies ordered?  No What is the name of the medical supply agency? N/A Were you able to get the supplies/equipment? not applicable Do you have any questions related to the use of the equipment or supplies? No  Functional Questionnaire: (I = Independent and D = Dependent) ADLs: I  Bathing/Dressing- I  Meal Prep- I  Eating- I  Maintaining continence- I  Transferring/Ambulation- I  Managing Meds- I  Follow up appointments reviewed:   PCP Hospital f/u appt confirmed? No  Patient wants to follow up with her surgeon at this time.   [facility] f/u appt confirmed? Yes  Scheduled to see Dr. Roxan Hockey on 03/27/2020 @ 10 am.  Are transportation arrangements needed? No   If their condition worsens, is the pt aware to call PCP or go to the Emergency Dept.? Yes  Was the patient provided with contact information for the PCP's office or ED? Yes  Was to pt encouraged to call back with questions or concerns? Yes

## 2020-03-20 NOTE — Discharge Instructions (Signed)
Thoracotomy, Care After This sheet gives you information about how to care for yourself after your procedure. Your health care provider may also give you more specific instructions. If you have problems or questions, contact your health care provider. What can I expect after the procedure? After the procedure, it is common to have:  Redness, pain, and swelling around the incision area.  Fluid or blood coming from the incision area.  Pain when you breathe in.  Constipation.  Fatigue.  Loss of appetite.  Trouble sleeping.  activity  Rest as told by your health care provider. Ask your health care provider what activities are safe for you.  Do not travel by airplane for 2 weeks after your chest tube is removed, or until your health care provider says that this is safe.  Do not lift anything that is heavier than 10 lb (4.5 kg), or the limit that you are told, until your health care provider says that it is safe.   Incision care  Follow instructions from your health care provider about how to take care of your incision. Make sure you: ? Wash your hands with soap and water for at least 20 seconds before and after you change your bandage (dressing). If soap and water are not available, use hand sanitizer. ? Change your dressing as told by your health care provider. ? Leave stitches (sutures), skin glue, or adhesive strips in place. These skin closures may need to stay in place for 2 weeks or longer. If adhesive strip edges start to loosen and curl up, you may trim the loose edges. Do not remove adhesive strips completely unless your health care provider tells you to do that.  Keep your dressing dry.  Check your incision area every day for signs of infection. Check for: ? More redness, swelling, or pain. ? More fluid or blood. ? Warmth. ? Pus or a bad smell.  After your dressing has been removed, use soap and water to gently wash your incision area. Do not use anything else to  clean your incision unless told by your health care provider.   Lifestyle  Do not take baths, swim, or use a hot tub until your health care provider approves. Ask your health care provider if you may take showers. You may only be allowed to take sponge baths.  Do not use any products that contain nicotine or tobacco, such as cigarettes, e-cigarettes, and chewing tobacco. If you need help quitting, ask your health care provider.  Avoid secondhand smoke.  Eat a healthy diet as instructed by your health care provider. A healthy diet includes plenty of fresh fruits and vegetables, whole grains, and low-fat (lean) proteins. General instructions  Wear compression stockings as told by your health care provider. These stockings help to prevent blood clots and reduce swelling in your legs.  If you have a chest tube, care for it as told by your health care provider.  Keep all follow-up visits as told by your health care provider. This is important. Contact a health care provider if:  You have any of these signs of infection: ? More redness, swelling, or pain around your incision. ? More fluid or blood coming from your incision. ? Warmth coming from your incision. ? Pus or a bad smell coming from your incision. ? A fever or chills.  Your heartbeat feels irregular.  You have nausea or vomiting.  Your muscles ache or you feel very weak.  You feel light-headed or feel like  you are going to faint.  You have severe pain, or pain that gets worse, even with medicine. Get help right away if:  You develop a rash.  You are short of breath or have trouble breathing.  You develop signs of a blood clot, such as: ? Trouble speaking. ? Feeling confused. ? Vision problems. ? Being unable to move. ? Numbness in your face, arms, or legs.  You faint.  You have a sudden, severe headache.  You have chest pain. These symptoms may represent a serious problem that is an emergency. Do not wait to  see if the symptoms will go away. Get medical help right away. Call your local emergency services (911 in the [place] Do not drive yourself to the hospital. Summary  After the procedure, it is common to have pain, swelling, redness, fluid, or blood around the incision area.  To prevent pneumonia, take deep breaths, do breathing exercises, and cough frequently, as instructed by your health care provider.  Do not travel by airplane for 2 weeks after your chest tube is removed, or until your health care provider approves.  Check your incision area every day for signs of infection.  Eat a healthy diet that includes plenty of fresh fruits and vegetables, whole grains, and low-fat (lean) proteins. This information is not intended to replace advice given to you by your health care provider. Make sure you discuss any questions you have with your health care provider. Document Revised: 10/19/2018 Document Reviewed: 10/19/2018 Elsevier Patient Education  2021 [employer]    Robot-assisted thoracic surgery is a procedure in which robotic arms and surgical instruments are used to perform complex procedures through small incisions. During surgery, the surgeon sits at a console inside of the operating room and uses controls at the console to move the robotic arms. Surgical instruments are attached to the ends of the robotic arms. Instruments include a tool with a light and camera on the end of it (thoracoscope). The camera sends images to a video monitor that your surgeon will use to view the inside of your thoracic area. The thoracic area is between the neck and abdomen. Robot-assisted thoracic surgery may be done to:  Remove a tissue sample to be tested in a lab (biopsy).  Remove a part of a lung (lobectomy) or the whole lung (pneumonectomy).  Remove tumors.  Treat other conditions that affect: ? Your esophagus. This is the part of your body that carries food and  liquid from your mouth to your stomach. ? Your diaphragm. This is a muscle wall between your lungs and stomach area. It helps with breathing.  Remove a portion of the nerves (sympathectomy) that cause excessive sweating. Tell a health care provider about:  Any allergies you have. This is especially important if you have allergies to medicines or sedatives.  All medicines you are taking, including vitamins, herbs, eye drops, creams, and over-the-counter medicines.  Any problems you or family members have had with anesthetic medicines.  Any blood disorders you have.  Any surgeries you have had.  Any medical conditions you have.  Whether you are pregnant or may be pregnant. What are the risks? Generally, this is a safe procedure. However, problems may occur, including:  Infection, such as pneumonia.  Severe bleeding (hemorrhage).  Allergic reactions to medicines.  Damage to organs or structures such as nerves or blood vessels. What happens before the procedure? Staying hydrated Follow instructions from your health care provider about hydration, which may  include:  Up to 2 hours before the procedure - you may continue to drink clear liquids, such as water, clear fruit juice, black coffee, and plain tea. Eating and drinking restrictions Follow instructions from your health care provider about eating and drinking, which may include:  8 hours before the procedure - stop eating heavy meals or foods, such as meat, fried foods, or fatty foods.  6 hours before the procedure - stop eating light meals or foods, such as toast or cereal.  6 hours before the procedure - stop drinking milk or drinks that contain milk.  2 hours before the procedure - stop drinking clear liquids. Medicines  Ask your health care provider about: ? Changing or stopping your regular medicines. This is especially important if you are taking diabetes medicines or blood thinners. ? Taking medicines such as  aspirin and ibuprofen. These medicines can thin your blood. Do not take these medicines unless your health care provider tells you to take them. ? Taking over-the-counter medicines, vitamins, herbs, and supplements.  Talk with your health care provider about safe and effective ways to manage pain before and after your procedure. Pain management should fit your specific health needs. Tests  You may have tests, such as: ? CT scan. ? Ultrasound. ? Chest X-ray. ? Electrocardiogram (ECG).  You may have a blood or urine sample taken. General instructions  Ask your health care provider: ? How your surgery site will be marked. ? What steps will be taken to help prevent infection. These steps may include:  Removing hair at the surgery site.  Washing skin with a germ-killing soap.  Receiving antibiotic medicine.  Do not use any products that contain nicotine or tobacco for at least 4 weeks before the procedure. These products include cigarettes, chewing tobacco, and vaping devices, such as e-cigarettes. If you need help quitting, ask your health care provider.  Plan to have a responsible adult take you home from the hospital or clinic.  Plan to have a responsible adult care for you for the time you are told after you leave the hospital or clinic. This is important. What happens during the procedure?  An IV will be inserted into one of your veins.  You will be given one or more of the following: ? A medicine to help you relax (sedative). ? A medicine to make you fall asleep (general anesthetic). ? A medicine that is injected into an area of your body to numb everything below the injection site (regional anesthetic).  One to four small incisions will be made. The number of incisions and the area where incisions are made will depend on the purpose of your procedure.  A surgical assistant will then place instruments that are attached to the robotic arms into your thoracic cavity through  these small incisions. The robotic arms are equipped with different surgical instruments and a high-definition 3D camera.  The surgeon will sit at a control console and see a magnified, high-resolution 3D image of the surgical field on a monitor. The surgeon will use master controls from the console that respond to the surgeon's movements in real time.  The computer-assisted robot will translate the surgeon's hand, wrist, or finger movements into precise movements of the four robotic arms so that the necessary procedures can be performed.  A drainage tube may be placed to drain excess fluid from the chest cavity.  Your incisions will be closed with stitches (sutures) or staples and covered with a bandage (dressing). The procedure  may vary among health care providers and hospitals. What happens after the procedure?  Your blood pressure, heart rate, breathing rate, and blood oxygen level will be monitored until you leave the hospital or clinic.  You may have a drainage tube in place. It may stay in place for a few days after the procedure to monitor for signs of air or fluid buildup in the chest cavity. Summary  Robot-assisted thoracic surgery is a procedure in which robotic arms and surgical instruments are used to help perform complex procedures through small incisions. During surgery, the surgeon sits at a console in the operating room and uses controls at the console to move the robotic arms.  A drainage tube may be placed to drain excess fluid from the chest cavity. The tube will be closely monitored for fluid or air buildup in the chest cavity. This information is not intended to replace advice given to you by your health care provider. Make sure you discuss any questions you have with your health care provider. Document Revised: 10/03/2019 Document Reviewed: 10/03/2019 Elsevier Patient Education  2021 [employer].

## 2020-03-20 NOTE — Progress Notes (Signed)
Removed PIV access and patient received discharge instructions. Patient understood it well. HS [employer]

## 2020-03-21 ENCOUNTER — Other Ambulatory Visit: Payer: Self-pay | Admitting: Family Medicine

## 2020-03-21 NOTE — Telephone Encounter (Signed)
ERx 

## 2020-03-21 NOTE — Telephone Encounter (Signed)
Pharmacy requests refill on: Alendronate 70 mg   LAST REFILL: 01/03/2020 (Q-12, R-0) LAST OV: 06/01/2019 NEXT OV: Not Scheduled  PHARMACY: [id number] [place], [place]

## 2020-03-25 ENCOUNTER — Other Ambulatory Visit: Payer: Self-pay | Admitting: Family Medicine

## 2020-03-26 ENCOUNTER — Other Ambulatory Visit: Payer: Self-pay | Admitting: Thoracic Surgery (Cardiothoracic Vascular Surgery)

## 2020-03-26 DIAGNOSIS — C3492 Malignant neoplasm of unspecified part of left bronchus or lung: Secondary | ICD-10-CM

## 2020-03-27 ENCOUNTER — Encounter: Payer: Self-pay | Admitting: Thoracic Surgery (Cardiothoracic Vascular Surgery)

## 2020-03-27 ENCOUNTER — Ambulatory Visit (INDEPENDENT_AMBULATORY_CARE_PROVIDER_SITE_OTHER): Payer: Self-pay | Admitting: Thoracic Surgery (Cardiothoracic Vascular Surgery)

## 2020-03-27 ENCOUNTER — Other Ambulatory Visit: Payer: Self-pay

## 2020-03-27 ENCOUNTER — Ambulatory Visit
Admission: RE | Admit: 2020-03-27 | Discharge: 2020-03-27 | Disposition: A | Discharge status: Home / Self Care | Payer: Federal, State, Local not specified - PPO | Source: Ambulatory Visit | Attending: Thoracic Surgery (Cardiothoracic Vascular Surgery) | Admitting: Thoracic Surgery (Cardiothoracic Vascular Surgery)

## 2020-03-27 VITALS — BP 111/76 | HR 84 | Temp 97.0°F | Resp 20 | Ht 64.0 in | Wt 131.0 lb

## 2020-03-27 DIAGNOSIS — C3432 Malignant neoplasm of lower lobe, left bronchus or lung: Secondary | ICD-10-CM

## 2020-03-27 DIAGNOSIS — Z09 Encounter for follow-up examination after completed treatment for conditions other than malignant neoplasm: Secondary | ICD-10-CM

## 2020-03-27 DIAGNOSIS — C3492 Malignant neoplasm of unspecified part of left bronchus or lung: Secondary | ICD-10-CM

## 2020-03-27 MED ORDER — HYDROMORPHONE HCL 2 MG PO TABS: 1.0000 mg | ORAL_TABLET | ORAL | 0 refills | Status: DC | PRN

## 2020-03-27 NOTE — Progress Notes (Signed)
Cassie Shaw       ,Cassie Shaw             [phone]     HPI: Cassie Shaw returns for scheduled follow-up after recent left lower lobectomy.  Cassie Shaw is a 74 year old woman with a history of tobacco abuse, peripheral neuropathy, hyperlipidemia, B12 deficiency, fibromyalgia, osteoporosis, and irritable bowel syndrome.  She was found to have a lung nodule on a low-dose screening CT in October 2021.  On follow-up CT at 3 months the nodule was larger.  On PET CT there was mild uptake with an SUV of 2.4.  I did a robotic left lower lobectomy and node dissection on 03/15/2020.  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  She had air leak for couple days but that resolved and she went home on day 5.  She says her breathing sometimes feels a little short, usually associated with pain.  She complains of pain that is making it hard for her to sleep through the night.  She has been taking gabapentin at night along with the tramadol.  She says the tramadol helps for about an hour.  She is requesting a stronger pain medication.  Past Medical History:  Diagnosis Date  . Arthritis   . B12 deficiency   . COPD (chronic obstructive pulmonary disease) ([place])   . Ex-smoker   . Fibrocystic breast disease   . Fibromyalgia   . History of chicken pox   . History of depression    and anxiety  . HLD (hyperlipidemia)   . IBS (irritable bowel syndrome)   . Osteoporosis 2015   on/off fosamax DEXA T -3.5 spine, -2.5 hip  . Peripheral neuropathy   . Retinal detachment      Current Outpatient Medications  Medication Sig Dispense Refill  . acetaminophen (TYLENOL) 325 MG tablet Take 650 mg by mouth every 6 (six) hours as needed.    Marland Kitchen alendronate (FOSAMAX) 70 MG tablet TAKE 1 TABLET BY MOUTH ONE TIME PER WEEK 12 tablet 1  . Calcium Carb-Cholecalciferol (CALCIUM-VITAMIN D) 600-400 MG-UNIT TABS Take 1 tablet by mouth daily.    . Cholecalciferol (VITAMIN D3)  1000 units CAPS Take 1 capsule (1,000 Units total) by mouth daily. 30 capsule   . diphenhydrAMINE (SOMINEX) 25 MG tablet Take 25 mg by mouth at bedtime as needed for sleep.    Marland Kitchen gabapentin (NEURONTIN) 600 MG tablet TAKE 1 TABLET BY MOUTH TWICE A DAY (Patient taking differently: Take 600 mg by mouth daily as needed (pain).) 180 tablet 1  . HYDROmorphone (DILAUDID) 2 MG tablet Take 0.5 tablets (1 mg total) by mouth every 4 (four) hours as needed for severe pain. 40 tablet 0  . ibuprofen (ADVIL) 200 MG tablet Take 200 mg by mouth 2 (two) times daily as needed for moderate pain.    . melatonin 1 MG TABS tablet Take 3 mg by mouth at bedtime.    . Multiple Vitamin (MULTIVITAMIN) tablet Take 1 tablet by mouth daily.    Marland Kitchen lovastatin (MEVACOR) 40 MG tablet TAKE 1 TABLET BY MOUTH EVERYDAY AT BEDTIME 90 tablet 0  . vitamin B-12 (CYANOCOBALAMIN) 1000 MCG tablet Take 1 tablet (1,000 mcg total) by mouth every Monday, Wednesday, and Friday. (Patient not taking: Reported on 03/27/2020)     No current facility-administered medications for this visit.    Physical Exam BP 111/76 (BP Location: Right Arm, Patient Position: Sitting, Cuff Size: Normal)  Pulse 84   Temp (!) 97 F (36.1 C)   Resp 20   Ht 5\' 4"  (1.626 m)   Wt 131 lb (59.4 kg)   SpO2 97% Comment: RA  BMI 22.63 kg/m  75 year old woman in no acute distress Alert and oriented x3 with no focal deficits Lungs diminished at left base but otherwise clear no rales or wheezing Incisions clean dry and intact Cardiac regular rate and rhythm No peripheral edema  Diagnostic Tests: I reviewed her chest x-ray.  Shows postoperative changes from a left lower lobectomy.  There is good aeration of the lungs bilaterally.  Impression: Cassie Shaw is a 75 year old woman with a history of tobacco abuse, peripheral neuropathy, hyperlipidemia, B12 deficiency, fibromyalgia, osteoporosis, and irritable bowel syndrome.  She was found to have a lung nodule on a low-dose  screening CT.  On follow-up CT the nodule had increased in size and was mildly hypermetabolic on PET.  She underwent a robotic left lower lobectomy on 03/15/2020.  The nodule turned out to be a stage Ia adenocarcinoma (T1, N0).  Her postoperative course was uncomplicated and she went home on day 5.  Currently she is doing well overall.  She is having pain.  She has only been taking her gabapentin once a day even though it is prescribed for twice a day.  I recommend she go back to the prescribed dose on that.  Tramadol does not seem to be effective for her so we will discontinue that medication.  She has had adverse effects with oxycodone in the past.  We will try low-dose Dilaudid 1 to 2mg  every 4-6 hours as needed for pain.  40 tablets, no refills.  She should avoid any heavy lifting or heavy exertion until her pain improves.  She was encouraged to walk as much as possible.  She needs a referral to oncology.  She wants to do that at [facility].  We will make that referral.  Plan: DC tramadol Dilaudid 1 to 2 mg p.o. every 4-6 hours as needed pain Continue gabapentin, Tylenol, and PRN ibuprofen Referral to oncology Follow-up with Dr. Patsey Berthold Return in 3 weeks with PA lateral chest x-ray to check on progress  Cassie Nakayama, MD [facility] [phone]

## 2020-03-27 NOTE — Telephone Encounter (Signed)
Pharmacy requests refill on: Gabapentin 600 mg   LAST REFILL: 02/23/2019 (Q-180, R-1)  LAST OV: 06/01/2019 NEXT OV: Not Scheduled  PHARMACY: [id number] [place], [place] requests refill on: Lovastatin 40 mg   LAST REFILL: 03/20/2020 (Put in as No Print and was not received to pharmacy) LAST OV: 06/01/2019 NEXT OV: Not Scheduled  PHARMACY: [id number] [place], [place]

## 2020-03-29 ENCOUNTER — Other Ambulatory Visit: Payer: Self-pay | Admitting: *Deleted

## 2020-03-29 NOTE — Progress Notes (Signed)
The proposed treatment discussed in cancer conference is for discussion purpose only and is not a binding recommendation. The patient was not physically examined nor present for their treatment options. Therefore, final treatment plans cannot be decided.  ?

## 2020-03-30 ENCOUNTER — Inpatient Hospital Stay: Payer: Federal, State, Local not specified - PPO

## 2020-03-30 ENCOUNTER — Encounter: Payer: Self-pay | Admitting: *Deleted

## 2020-03-30 ENCOUNTER — Inpatient Hospital Stay: Payer: Federal, State, Local not specified - PPO | Attending: Oncology | Admitting: Oncology

## 2020-03-30 ENCOUNTER — Encounter: Payer: Self-pay | Admitting: Oncology

## 2020-03-30 ENCOUNTER — Other Ambulatory Visit: Payer: Self-pay | Admitting: *Deleted

## 2020-03-30 VITALS — BP 106/78 | HR 83 | Resp 20 | Wt 129.5 lb

## 2020-03-30 DIAGNOSIS — R110 Nausea: Secondary | ICD-10-CM

## 2020-03-30 DIAGNOSIS — Z902 Acquired absence of lung [part of]: Secondary | ICD-10-CM | POA: Diagnosis not present

## 2020-03-30 DIAGNOSIS — R911 Solitary pulmonary nodule: Secondary | ICD-10-CM

## 2020-03-30 DIAGNOSIS — Z85118 Personal history of other malignant neoplasm of bronchus and lung: Secondary | ICD-10-CM

## 2020-03-30 DIAGNOSIS — C3492 Malignant neoplasm of unspecified part of left bronchus or lung: Secondary | ICD-10-CM | POA: Diagnosis not present

## 2020-03-30 DIAGNOSIS — C3432 Malignant neoplasm of lower lobe, left bronchus or lung: Secondary | ICD-10-CM | POA: Diagnosis present

## 2020-03-30 DIAGNOSIS — Z87891 Personal history of nicotine dependence: Secondary | ICD-10-CM | POA: Insufficient documentation

## 2020-03-30 MED ORDER — ONDANSETRON HCL 4 MG PO TABS: 4.0000 mg | ORAL_TABLET | Freq: Three times a day (TID) | ORAL | 0 refills | Status: DC | PRN

## 2020-03-30 MED ORDER — ONDANSETRON HCL 4 MG PO TABS
8.0000 mg | ORAL_TABLET | Freq: Once | ORAL | Status: AC
  Administered 2020-03-30: 8 mg via ORAL
  Filled 2020-03-30: qty 2

## 2020-03-30 MED ORDER — ONDANSETRON HCL 4 MG PO TABS: 8.0000 mg | ORAL_TABLET | Freq: Once | ORAL | Status: DC

## 2020-03-30 NOTE — Progress Notes (Signed)
  Oncology Nurse Navigator Documentation  Navigator Location: [facility]Med [facility] (03/30/20 1000) Referral Date to RadOnc/MedOnc: 03/27/20 (03/30/20 1000) )Navigator Encounter Type: Initial MedOnc (03/30/20 1000)   Abnormal Finding Date: 02/16/20 (03/30/20 1000) Confirmed Diagnosis Date: 03/19/20 (03/30/20 1000)         Multidisiplinary Clinic Date: 03/30/20 (03/30/20 1000) Multidisiplinary Clinic Type: Thoracic (03/30/20 1000) Treatment Initiated Date: 03/15/20 (03/30/20 1000)   Treatment Phase: Post-Tx Follow-up (03/30/20 1000) Barriers/Navigation Needs: No Barriers At This Time (03/30/20 1000)   Interventions: None Required (03/30/20 1000)            Acuity: Level 1-No Barriers (03/30/20 1000)      No barriers identified at this time. Surveillance imaging only after surgery per Dr. Janese Banks. Navigation services not needed at this time.    Time Spent with Patient: 15 (03/30/20 1000)

## 2020-03-30 NOTE — Progress Notes (Signed)
Hematology/Oncology Consult note [facility] [phone] [phone]  Patient Care Team: Ria Bush, MD as PCP - General (Family Medicine) Telford Nab, RN as Oncology Nurse Navigator   Name of the patient: Cassie Shaw  [id number]  [birth year]    Reason for referral-lung cancer s/p wedge resection   Referring physician-Dr. Roxan Hockey  Date of visit: 03/30/20   History of presenting illness- Patient is a 75 year old female with history of irritable bowel syndrome, peripheral neuropathy, tobacco abuse among other medical problems.  She underwent a low-dose screening CT chest on October 2021 which showed a lung nodule in the left lower lobe 11.3 mm with a solid component of 6.6 mm.  There was improvement in the size of this nodule 3 months later.  A PET CT scan showed mild metabolic activity within the cavitary lesion in the left lower lobe.  No evidence of mediastinal or hilar adenopathy or distant metastatic disease.  Patient was seen by Dr. Roxan Hockey and underwent left lower lobe wedge resection on 03/15/2020.  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  No evidence of lymphovascular or visceral pleural invasion.  10 lymph nodes were negative for malignancy.  p T1b pN0  She is here to discuss further management.  Presently patient reports feeling very nauseous.  She states that she has been having on and off symptoms of nausea over the last 2 years.  She is not able to elicit it much history in this regard as she is having nausea at this time.  Reports occasional pain at the site of wedge resection but denies other complaints  ECOG PS- 1  Pain scale- 2   Review of systems- Review of Systems  Constitutional: Positive for malaise/fatigue. Negative for chills, fever and weight loss.  HENT: Negative for congestion, ear discharge and nosebleeds.   Eyes: Negative for blurred vision.  Respiratory: Negative  for cough, hemoptysis, sputum production, shortness of breath and wheezing.   Cardiovascular: Negative for chest pain, palpitations, orthopnea and claudication.  Gastrointestinal: Positive for nausea. Negative for abdominal pain, blood in stool, constipation, diarrhea, heartburn, melena and vomiting.  Genitourinary: Negative for dysuria, flank pain, frequency, hematuria and urgency.  Musculoskeletal: Negative for back pain, joint pain and myalgias.  Skin: Negative for rash.  Neurological: Negative for dizziness, tingling, focal weakness, seizures, weakness and headaches.  Endo/Heme/Allergies: Does not bruise/bleed easily.  Psychiatric/Behavioral: Negative for depression and suicidal ideas. The patient does not have insomnia.     No Known Allergies  Patient Active Problem List   Diagnosis Date Noted  . Squamous cell lung cancer, left ([place]) 03/20/2020  . S/P robot-assisted surgical procedure 03/15/2020  . Ex-smoker 06/28/2017  . Advanced care planning/counseling discussion 06/28/2017  . Abnormal TSH 06/28/2017  . COPD (chronic obstructive pulmonary disease) ([place]) 06/26/2017  . Thoracic aortic atherosclerosis ([place]) 06/26/2017  . Health maintenance examination 06/25/2017  . Vertigo 03/13/2016  . Osteoporosis   . Fibromyalgia   . Fibrocystic breast disease   . Vitamin B12 deficiency   . Foot pain, bilateral 11/14/2014  . IBS (irritable bowel syndrome)   . Dyslipidemia   . Lower back pain      Past Medical History:  Diagnosis Date  . Arthritis   . B12 deficiency   . COPD (chronic obstructive pulmonary disease) ([place])   . Ex-smoker   . Fibrocystic breast disease   . Fibromyalgia   . History of chicken pox   . History of depression  and anxiety  . HLD (hyperlipidemia)   . IBS (irritable bowel syndrome)   . Osteoporosis 2015   on/off fosamax DEXA T -3.5 spine, -2.5 hip  . Peripheral neuropathy   . Retinal detachment      Past Surgical History:  Procedure Laterality  Date  . CATARACT EXTRACTION Bilateral 2014   with lens implant  . COLONOSCOPY  12/2005   WNL ([place])  . COLONOSCOPY  08/2017   5 TAs, rpt 5 yrs ([place])  . DEXA  01/2013   T -3.5 spine, -2.5 hip  . INTERCOSTAL NERVE BLOCK Left 03/15/2020   Procedure: INTERCOSTAL NERVE BLOCK;  Surgeon: Melrose Nakayama, MD;  Location: [place];  Service: Thoracic;  Laterality: Left;  . LYMPH NODE DISSECTION Bilateral 03/15/2020   Procedure: LYMPH NODE DISSECTION;  Surgeon: Melrose Nakayama, MD;  Location: [place];  Service: Thoracic;  Laterality: Bilateral;  . PARTIAL HYSTERECTOMY  1975   for heavy bleeding, ovaries remained  . RETINAL DETACHMENT SURGERY  2014   Dr Zadie Rhine    Social History   Socioeconomic History  . Marital status: Married    Spouse name: Not on file  . Number of children: Not on file  . Years of education: Not on file  . Highest education level: Not on file  Occupational History  . Not on file  Tobacco Use  . Smoking status: Former Smoker    Packs/day: 1.00    Years: 46.00    Pack years: 46.00    Types: Cigarettes    Quit date: 01/21/2004    Years since quitting: 16.2  . Smokeless tobacco: Never Used  Vaping Use  . Vaping Use: Never used  Substance and Sexual Activity  . Alcohol use: No    Alcohol/week: 0.0 standard drinks  . Drug use: No  . Sexual activity: Not on file  Other Topics Concern  . Not on file  Social History Narrative   Lives with husband [name]), 2 dogs and a bird   Occupation: retired, was Armed forces logistics/support/administrative officer   Activity: no regular exercise   Diet: good water, fruits/vegetables daily   Social Determinants of Radio broadcast assistant Strain: Not on file  Food Insecurity: Not on file  Transportation Needs: Not on file  Physical Activity: Not on file  Stress: Not on file  Social Connections: Not on file  Intimate Partner Violence: Not on file     Family History  Problem Relation Age of Onset  . Arthritis Mother   . Cancer Mother        smoker   . Cancer Sister        bone  . Diabetes Maternal Uncle   . Cancer Maternal Grandfather   . Stroke Neg Hx   . CAD Neg Hx   . Breast cancer Neg Hx      Current Outpatient Medications:  .  acetaminophen (TYLENOL) 325 MG tablet, Take 650 mg by mouth every 6 (six) hours as needed., Disp: , Rfl:  .  alendronate (FOSAMAX) 70 MG tablet, TAKE 1 TABLET BY MOUTH ONE TIME PER WEEK, Disp: 12 tablet, Rfl: 1 .  Calcium Carb-Cholecalciferol (CALCIUM-VITAMIN D) 600-400 MG-UNIT TABS, Take 1 tablet by mouth daily., Disp: , Rfl:  .  Cholecalciferol (VITAMIN D3) 1000 units CAPS, Take 1 capsule (1,000 Units total) by mouth daily., Disp: 30 capsule, Rfl:  .  diphenhydrAMINE (SOMINEX) 25 MG tablet, Take 25 mg by mouth at bedtime as needed for sleep., Disp: , Rfl:  .  gabapentin (NEURONTIN) 600 MG tablet, TAKE 1 TABLET BY MOUTH TWICE A DAY, Disp: 180 tablet, Rfl: 1 .  HYDROmorphone (DILAUDID) 2 MG tablet, Take 0.5 tablets (1 mg total) by mouth every 4 (four) hours as needed for severe pain., Disp: 40 tablet, Rfl: 0 .  ibuprofen (ADVIL) 200 MG tablet, Take 200 mg by mouth 2 (two) times daily as needed for moderate pain., Disp: , Rfl:  .  lovastatin (MEVACOR) 40 MG tablet, TAKE 1 TABLET BY MOUTH EVERYDAY AT BEDTIME, Disp: 90 tablet, Rfl: 0 .  melatonin 1 MG TABS tablet, Take 3 mg by mouth at bedtime., Disp: , Rfl:  .  Multiple Vitamin (MULTIVITAMIN) tablet, Take 1 tablet by mouth daily., Disp: , Rfl:  .  ondansetron (ZOFRAN) 4 MG tablet, Take 1 tablet (4 mg total) by mouth every 8 (eight) hours as needed for nausea or vomiting., Disp: 15 tablet, Rfl: 0 .  vitamin B-12 (CYANOCOBALAMIN) 1000 MCG tablet, Take 1 tablet (1,000 mcg total) by mouth every Monday, Wednesday, and Friday. (Patient not taking: No sig reported), Disp: , Rfl:    Physical exam:  Vitals:   03/30/20 0946  BP: 106/78  Pulse: 83  Resp: 20  SpO2: 100%  Weight: 129 lb 8 oz (58.7 kg)   Physical Exam Constitutional:      Comments: Appears in  distress due to nausea  Cardiovascular:     Rate and Rhythm: Normal rate and regular rhythm.     Heart sounds: Normal heart sounds.  Pulmonary:     Effort: Pulmonary effort is normal.     Breath sounds: Normal breath sounds.     Comments: Scar of recent wedge resection has healed well Abdominal:     General: Bowel sounds are normal.     Palpations: Abdomen is soft.  Skin:    General: Skin is warm and dry.  Neurological:     Mental Status: She is alert and oriented to person, place, and time.        CMP Latest Ref Rng & Units 03/17/2020  Glucose 70 - 99 mg/dL 111(H)  BUN 8 - 23 mg/dL 11  Creatinine 0.44 - 1.00 mg/dL 0.68  Sodium 135 - 145 mmol/L 140  Potassium 3.5 - 5.1 mmol/L 3.6  Chloride 98 - 111 mmol/L 107  CO2 22 - 32 mmol/L 25  Calcium 8.9 - 10.3 mg/dL 8.6(L)  Total Protein 6.5 - 8.1 g/dL 6.1(L)  Total Bilirubin 0.3 - 1.2 mg/dL 0.8  Alkaline Phos 38 - 126 U/L 15(L)  AST 15 - 41 U/L 25  ALT 0 - 44 U/L 13   CBC Latest Ref Rng & Units 03/17/2020  WBC 4.0 - 10.5 K/uL 8.7  Hemoglobin 12.0 - 15.0 g/dL 10.6(L)  Hematocrit 36.0 - 46.0 % 32.3(L)  Platelets 150 - 400 K/uL 170    No images are attached to the encounter.  DG Chest 2 View  Result Date: 03/27/2020 CLINICAL DATA:  Previous surgery for lung carcinoma.  Chest pain. EXAM: CHEST - 2 VIEW COMPARISON:  March 20, 2020 FINDINGS: Postoperative change noted on the left with volume loss. Previously noted apical pneumothorax on the left is no longer evident. There is soft tissue air on the left, less than on most recent study. There is atelectatic change in the left base. Lungs elsewhere clear. Heart size is normal. Pulmonary vascular within normal limits. No adenopathy evident. No bony abnormality. IMPRESSION: Postoperative change with volume loss on the left. Soft tissue air noted on the left,  less than on previous study. Previously noted pneumothorax on the left no longer evident. No edema or airspace opacity. Heart size  normal. Electronically Signed   By: Lowella Grip III M.D.   On: 03/27/2020 10:05   DG Chest 2 View  Result Date: 03/20/2020 CLINICAL DATA:  Status post lobectomy. EXAM: CHEST - 2 VIEW COMPARISON:  March 19, 2020. FINDINGS: Stable cardiomediastinal silhouette. Left-sided chest tube is unchanged in position. Stable small left apical pneumothorax is noted. Right lung is clear. Stable subcutaneous emphysema is seen over left lateral chest wall. Bony thorax is unremarkable. IMPRESSION: Stable small left apical pneumothorax. Stable left-sided chest tube. Stable subcutaneous emphysema seen over left lateral chest wall. Electronically Signed   By: Marijo Conception M.D.   On: 03/20/2020 08:26   DG Chest 2 View  Result Date: 03/14/2020 CLINICAL DATA:  75 year old female with preoperative study for lung biopsy EXAM: CHEST - 2 VIEW COMPARISON:  PET-CT 02/27/2020, chest x-ray 06/25/2017 FINDINGS: Cardiomediastinal silhouette unchanged in size and contour. Nodularity at the right lung base has resolved compared to prior plain film. Mild pleuroparenchymal thickening at the apices is unchanged from prior. The known cavitary lesion at the lateral left lung base is better characterized on prior PET-CT. No pleural effusion or pneumothorax. No confluent airspace disease. IMPRESSION: Negative for acute cardiopulmonary disease. Cavitary lesion at the lateral left lung base better characterized on prior PET-CT. Electronically Signed   By: Corrie Mckusick D.O.   On: 03/14/2020 14:49   DG Chest 1V REPEAT Same Day  Result Date: 03/20/2020 CLINICAL DATA:  Status post chest tube removal. EXAM: CHEST - 1 VIEW SAME DAY COMPARISON:  Same day. FINDINGS: Normal cardiomediastinal silhouette. Left-sided chest tube has been removed. Stable small left apical pneumothorax is noted. Right lung is clear. Continued subcutaneous emphysema is seen over the left lateral chest wall. Bony thorax is unremarkable. IMPRESSION: Stable small left  apical pneumothorax. Left-sided chest tube has been removed. Continued subcutaneous emphysema over the left lateral chest wall. Electronically Signed   By: Marijo Conception M.D.   On: 03/20/2020 11:54   DG Chest Port 1 View  Result Date: 03/19/2020 CLINICAL DATA:  Pneumothorax EXAM: PORTABLE CHEST 1 VIEW COMPARISON:  03/19/2020 FINDINGS: Left chest tube remains in place. Small residual left apical pneumothorax and subcutaneous emphysema in the left chest wall, unchanged. No confluent opacities. Heart is normal size. IMPRESSION: Left chest tube remains in place with small residual left apical pneumothorax and left subcutaneous emphysema, unchanged. Electronically Signed   By: Rolm Baptise M.D.   On: 03/19/2020 10:47   DG CHEST PORT 1 VIEW  Result Date: 03/19/2020 CLINICAL DATA:  75 year old female with history of left lower lobectomy. Follow-up study. EXAM: PORTABLE CHEST 1 VIEW COMPARISON:  Chest x-ray 03/18/2020. FINDINGS: Postoperative changes of left lower lobectomy are noted with compensatory hyperexpansion of the left upper lobe. Left-sided chest tube remains in position with tip in the apex of the left hemithorax. Small residual apical pneumothorax, slightly decreased in size compared to the prior examination. Lungs are well aerated bilaterally. No pleural effusions. No evidence of pulmonary edema. Heart size is normal. Upper mediastinal contours are within normal limits. Extensive subcutaneous emphysema in the left chest wall again noted. IMPRESSION: 1. Status post left lower lobectomy with stable position of left-sided chest tube and slight decreased size of small left apical pneumothorax. Electronically Signed   By: Vinnie Langton M.D.   On: 03/19/2020 08:12   DG CHEST PORT 1 VIEW  Result Date: 03/18/2020 CLINICAL DATA:  Status post partial left lung resection. EXAM: PORTABLE CHEST 1 VIEW COMPARISON:  03/17/2020 FINDINGS: Mild left-sided volume loss is again identified. Surgical staple line  noted at the left apex. Small left apical pneumothorax is unchanged. Large bore left chest tube is unchanged. Right lung is clear. No pneumothorax or pleural effusion on the right. Cardiac size within normal limits. IMPRESSION: Left chest tube in place.  Stable small left apical pneumothorax. Electronically Signed   By: Fidela Salisbury MD   On: 03/18/2020 05:22   DG CHEST PORT 1 VIEW  Result Date: 03/17/2020 CLINICAL DATA:  75 year old female postoperative day 2 status post left lower lobectomy four non-small cell carcinoma. EXAM: PORTABLE CHEST 1 VIEW COMPARISON:  Portable chest 03/16/2020 and earlier. FINDINGS: Portable AP semi upright view at 0509 hours. Stable left chest tube terminating at the apex. Small left pneumothorax has decreased, residual at the apex. Small volume left chest wall gas. Mild leftward shift of the mediastinum following the left lobectomy. Mediastinal contours remain within normal limits. Stable and negative right lung. Visualized tracheal air column is within normal limits. Stable visualized osseous structures. Negative visible bowel gas. IMPRESSION: 1. Status post left lower lobectomy with stable left chest tube. Small left pneumothorax has decreased, apical residual. 2. No new cardiopulmonary abnormality. Electronically Signed   By: Genevie Ann M.D.   On: 03/17/2020 07:25   DG Chest Port 1 View  Result Date: 03/16/2020 CLINICAL DATA:  Chest pain.  Chest tube present. EXAM: PORTABLE CHEST 1 VIEW COMPARISON:  March 15, 2020 and March 13, 2020 FINDINGS: Chest tube remains on the left with stable left apical pneumothorax. No tension component. Ill-defined airspace opacity in the left lower lobe region may represent combination of atelectasis and pleural effusion. Lungs elsewhere are clear. Heart size and pulmonary vascularity normal. No adenopathy. There is aortic atherosclerosis. No bone lesions. IMPRESSION: Postoperative change on the left with stable positioning of left chest  tube. Apical pneumothorax is stable without tension component. There is new ill-defined airspace opacity in the left base region, likely due to combination of effusion and atelectasis. Early pneumonia in this area cannot be excluded by radiography. Right lung clear. Stable cardiac silhouette. Aortic Atherosclerosis (ICD10-I70.0). Electronically Signed   By: Lowella Grip III M.D.   On: 03/16/2020 08:04   DG Chest Port 1 View  Result Date: 03/15/2020 CLINICAL DATA:  75 year old female status post robotic assisted surgery. EXAM: PORTABLE CHEST 1 VIEW COMPARISON:  Chest radiograph dated 03/13/2020. FINDINGS: There is a left apical pneumothorax measuring approximately 15 mm in thickness to the apical pleural surface. A chest tube is noted with tip in the left apical pleural surface. There is extension of air superiorly to the soft tissues of the left neck. Background of chronic interstitial coarsening. Bibasilar subpleural densities may represent atelectasis or small pleural effusion. Stable cardiomediastinal silhouette. Osteopenia with degenerative changes of the spine. No acute osseous pathology. IMPRESSION: Small left apical pneumothorax and chest tube. Electronically Signed   By: Anner Crete M.D.   On: 03/15/2020 15:13    Assessment and plan- Patient is a 75 y.o. 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  10 lymph nodes were negative for malignancy.  No evidence of visceral pleural invasion or lymphovascular invasion.  As per NCCN guidelines for stage Ia  disease there would be no indication for adjuvant chemotherapy or radiation treatment.  She will need surveillance scans with a CT chest every 6 months for the first 3 years followed by low-dose screening CT on a yearly basis.  I will see her back in 6 months with a CT chest without  contrast.  History of nausea: Patient states that this has been on and off for the last 2 years.  I have given her a one-time prescription for Compazine for 5 days which she will need to address this concern with her primary care doctor   Thank you for this kind referral and the opportunity to participate in the care of this patient   Visit Diagnosis 1. Lung nodule   2. Nausea without vomiting   3. Hx of cancer of lung     Dr. Randa Evens, MD, MPH [facility] at [facility] [phone] 03/30/2020 6:05 PM

## 2020-04-16 ENCOUNTER — Other Ambulatory Visit: Payer: Self-pay | Admitting: Thoracic Surgery (Cardiothoracic Vascular Surgery)

## 2020-04-16 DIAGNOSIS — C3492 Malignant neoplasm of unspecified part of left bronchus or lung: Secondary | ICD-10-CM

## 2020-04-17 ENCOUNTER — Encounter: Payer: Self-pay | Admitting: Thoracic Surgery (Cardiothoracic Vascular Surgery)

## 2020-04-17 ENCOUNTER — Ambulatory Visit
Admission: RE | Admit: 2020-04-17 | Discharge: 2020-04-17 | Disposition: A | Discharge status: Home / Self Care | Payer: Federal, State, Local not specified - PPO | Source: Ambulatory Visit | Attending: Thoracic Surgery (Cardiothoracic Vascular Surgery) | Admitting: Thoracic Surgery (Cardiothoracic Vascular Surgery)

## 2020-04-17 ENCOUNTER — Ambulatory Visit (INDEPENDENT_AMBULATORY_CARE_PROVIDER_SITE_OTHER): Payer: Self-pay | Admitting: Thoracic Surgery (Cardiothoracic Vascular Surgery)

## 2020-04-17 ENCOUNTER — Other Ambulatory Visit: Payer: Self-pay

## 2020-04-17 VITALS — BP 115/74 | HR 90 | Resp 20 | Ht 64.0 in | Wt 130.0 lb

## 2020-04-17 DIAGNOSIS — Z9889 Other specified postprocedural states: Secondary | ICD-10-CM

## 2020-04-17 DIAGNOSIS — C3492 Malignant neoplasm of unspecified part of left bronchus or lung: Secondary | ICD-10-CM

## 2020-04-17 MED ORDER — PREDNISONE 10 MG (21) PO TBPK: ORAL_TABLET | ORAL | 0 refills | Status: AC

## 2020-04-17 NOTE — Progress Notes (Signed)
[address]       [place] [address]             [phone]     HPI: Mrs. Avera returns for a scheduled follow-up visit after recent left lower lobectomy for stage Ia non-small cell carcinoma  Cassie Shaw is a 75 year old woman with a history of tobacco abuse, peripheral neuropathy, hyperlipidemia, B12 deficiency, fibromyalgia, osteoporosis, and irritable bowel syndrome.  She was found to have a lung nodule on a low-dose screening CT in October 2021.  On follow-up the nodule was larger.  Had mild uptake on PET.  I did a robotic left lower lobectomy on 03/15/2020.  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  She did well postoperatively and went home on day 5.  I saw her in the office on 03/27/2020.  She was having a lot of pain at that time.  She also complained of some shortness of breath.  She was only taking her gabapentin once a day rather than twice a day.  She also was requesting a stronger pain medication than tramadol.  We gave her a prescription for Dilaudid.  Her pain is improved significantly.  She still not able to lay on that left side.  She is taking the gabapentin twice daily.  She does complain of a frequent cough.  She has been taking over-the-counter cough medication for that.  She did see Dr. Janese Banks.  She is scheduled to have a follow-up with a CT in September.  Past Medical History:  Diagnosis Date  . Arthritis   . B12 deficiency   . COPD (chronic obstructive pulmonary disease) ([place])   . Ex-smoker   . Fibrocystic breast disease   . Fibromyalgia   . History of chicken pox   . History of depression    and anxiety  . HLD (hyperlipidemia)   . IBS (irritable bowel syndrome)   . Osteoporosis 2015   on/off fosamax DEXA T -3.5 spine, -2.5 hip  . Peripheral neuropathy   . Retinal detachment     Current Outpatient Medications  Medication Sig Dispense Refill  . acetaminophen (TYLENOL) 325 MG tablet Take 650 mg by mouth every 6  (six) hours as needed.    Marland Kitchen alendronate (FOSAMAX) 70 MG tablet TAKE 1 TABLET BY MOUTH ONE TIME PER WEEK 12 tablet 1  . Calcium Carb-Cholecalciferol (CALCIUM-VITAMIN D) 600-400 MG-UNIT TABS Take 1 tablet by mouth daily.    . Cholecalciferol (VITAMIN D3) 1000 units CAPS Take 1 capsule (1,000 Units total) by mouth daily. 30 capsule   . diphenhydrAMINE (SOMINEX) 25 MG tablet Take 25 mg by mouth at bedtime as needed for sleep.    Marland Kitchen gabapentin (NEURONTIN) 600 MG tablet TAKE 1 TABLET BY MOUTH TWICE A DAY 180 tablet 1  . ibuprofen (ADVIL) 200 MG tablet Take 200 mg by mouth 2 (two) times daily as needed for moderate pain.    Marland Kitchen lovastatin (MEVACOR) 40 MG tablet TAKE 1 TABLET BY MOUTH EVERYDAY AT BEDTIME 90 tablet 0  . melatonin 1 MG TABS tablet Take 3 mg by mouth at bedtime.    . Multiple Vitamin (MULTIVITAMIN) tablet Take 1 tablet by mouth daily.    . vitamin B-12 (CYANOCOBALAMIN) 1000 MCG tablet Take 1 tablet (1,000 mcg total) by mouth every Monday, Wednesday, and Friday.     No current facility-administered medications for this visit.    Physical Exam BP 115/74 (BP Location: Right Arm, Patient  Position: Sitting)   Pulse 90   Resp 20   Ht 5\' 4"  (1.626 m)   Wt 130 lb (59 kg)   SpO2 97% Comment: RA  BMI 22.58 kg/m  75 year old woman in no acute distress Alert and oriented x3 with no focal deficits Cardiac regular rate and rhythm Diminished breath sounds at left base otherwise clear Incisions well-healed  Diagnostic Tests: CHEST - 2 VIEW  COMPARISON:  03/27/2020  FINDINGS: Volume loss in LEFT hemithorax with mild mediastinal shift to LEFT.  Normal heart size and pulmonary vascularity.  Emphysematous changes consistent with COPD.  Elevation of LEFT diaphragm with LEFT basilar atelectasis and small pleural effusion.  No pulmonary infiltrate or pneumothorax.  No discrete pulmonary mass identified.  Osseous demineralization with old compression fracture of mid thoracic  spine.  IMPRESSION: COPD changes with atelectasis and small pleural effusion at LEFT lung base.   Electronically Signed   By: Lavonia Dana M.D.   On: 04/17/2020 12:02 I personally reviewed the chest x-ray images.  There are postoperative changes from a left lower lobectomy.  Impression: Cassie Shaw is a 75 year old woman with a history of tobacco abuse, peripheral neuropathy, hyperlipidemia, B12 deficiency, fibromyalgia, osteoporosis, and irritable bowel syndrome.  She was found to have a lung nodule on a low-dose screening CT.  It was mildly hypermetabolic on PET.  I did a robotic assisted left lower lobectomy and node dissection about a month ago.  It turned out to be a stage Ia adenocarcinoma.  Stage Ia adenocarcinoma-she saw Dr. Janese Banks.  No indication for adjuvant therapy.  Needs follow-up.  She is scheduled to see her back in September with a CT of the chest.  Postoperative pain/intercostal neuralgia-significantly improved from where it was at her last visit.  Continue gabapentin.  Cough- her cough predated her surgery but she thinks it has been worse since the procedure.  She may have an inflammatory component to that.  She can continue to take her over-the-counter medications.  I will give her a prednisone taper to see if that helps at all.  She is far enough out from surgery that it should not affect wound healing at all.  Plan: Return in 2 months with PA lateral chest x-ray  Melrose Nakayama, MD [facility] [phone]

## 2020-04-18 ENCOUNTER — Ambulatory Visit (INDEPENDENT_AMBULATORY_CARE_PROVIDER_SITE_OTHER): Payer: Federal, State, Local not specified - PPO | Admitting: Family Medicine

## 2020-04-18 ENCOUNTER — Encounter: Payer: Self-pay | Admitting: Family Medicine

## 2020-04-18 VITALS — BP 116/72 | HR 78 | Temp 97.5°F | Ht 64.0 in | Wt 130.2 lb

## 2020-04-18 DIAGNOSIS — K581 Irritable bowel syndrome with constipation: Secondary | ICD-10-CM | POA: Diagnosis not present

## 2020-04-18 DIAGNOSIS — E785 Hyperlipidemia, unspecified: Secondary | ICD-10-CM

## 2020-04-18 DIAGNOSIS — I700 Atherosclerosis of aorta: Secondary | ICD-10-CM

## 2020-04-18 DIAGNOSIS — E538 Deficiency of other specified B group vitamins: Secondary | ICD-10-CM

## 2020-04-18 DIAGNOSIS — K5909 Other constipation: Secondary | ICD-10-CM | POA: Diagnosis not present

## 2020-04-18 DIAGNOSIS — R110 Nausea: Secondary | ICD-10-CM

## 2020-04-18 DIAGNOSIS — Z0000 Encounter for general adult medical examination without abnormal findings: Secondary | ICD-10-CM | POA: Diagnosis not present

## 2020-04-18 DIAGNOSIS — C3492 Malignant neoplasm of unspecified part of left bronchus or lung: Secondary | ICD-10-CM

## 2020-04-18 DIAGNOSIS — J449 Chronic obstructive pulmonary disease, unspecified: Secondary | ICD-10-CM

## 2020-04-18 DIAGNOSIS — Z23 Encounter for immunization: Secondary | ICD-10-CM

## 2020-04-18 DIAGNOSIS — M810 Age-related osteoporosis without current pathological fracture: Secondary | ICD-10-CM

## 2020-04-18 DIAGNOSIS — R7989 Other specified abnormal findings of blood chemistry: Secondary | ICD-10-CM

## 2020-04-18 LAB — COMPREHENSIVE METABOLIC PANEL
ALT: 11 U/L (ref 0–35)
AST: 20 U/L (ref 0–37)
Albumin: 4.2 g/dL (ref 3.5–5.2)
Alkaline Phosphatase: 30 U/L — ABNORMAL LOW (ref 39–117)
BUN: 10 mg/dL (ref 6–23)
CO2: 26 mEq/L (ref 19–32)
Calcium: 9.3 mg/dL (ref 8.4–10.5)
Chloride: 103 mEq/L (ref 96–112)
Creatinine, Ser: 0.65 mg/dL (ref 0.40–1.20)
GFR: 85.88 mL/min (ref >60.00)
Glucose, Bld: 110 mg/dL — ABNORMAL HIGH (ref 70–99)
Potassium: 4.3 mEq/L (ref 3.5–5.1)
Sodium: 139 mEq/L (ref 135–145)
Total Bilirubin: 0.6 mg/dL (ref 0.2–1.2)
Total Protein: 7.8 g/dL (ref 6.0–8.3)

## 2020-04-18 LAB — CBC WITH DIFFERENTIAL/PLATELET
Basophils Absolute: 0 10*3/uL (ref 0.0–0.1)
Basophils Relative: 0.4 % (ref 0.0–3.0)
Eosinophils Absolute: 0.1 10*3/uL (ref 0.0–0.7)
Eosinophils Relative: 0.8 % (ref 0.0–5.0)
HCT: 39 % (ref 36.0–46.0)
Hemoglobin: 13 g/dL (ref 12.0–15.0)
Lymphocytes Relative: 20.2 % (ref 12.0–46.0)
Lymphs Abs: 1.4 10*3/uL (ref 0.7–4.0)
MCHC: 33.5 g/dL (ref 30.0–36.0)
MCV: 90.4 fl (ref 78.0–100.0)
Monocytes Absolute: 0.4 10*3/uL (ref 0.1–1.0)
Monocytes Relative: 5.6 % (ref 3.0–12.0)
Neutro Abs: 5.1 10*3/uL (ref 1.4–7.7)
Neutrophils Relative %: 73 % (ref 43.0–77.0)
Platelets: 214 10*3/uL (ref 150.0–400.0)
RBC: 4.31 Mil/uL (ref 3.87–5.11)
RDW: 13.2 % (ref 11.5–15.5)
WBC: 7 10*3/uL (ref 4.0–10.5)

## 2020-04-18 LAB — LIPASE: Lipase: 19 U/L (ref 11.0–59.0)

## 2020-04-18 MED ORDER — LINACLOTIDE 145 MCG PO CAPS: 145.0000 ug | ORAL_CAPSULE | Freq: Every day | ORAL | 3 refills | Status: DC

## 2020-04-18 NOTE — Assessment & Plan Note (Signed)
Preventative protocols reviewed and updated unless pt declined. Discussed healthy diet and lifestyle.  

## 2020-04-18 NOTE — Assessment & Plan Note (Signed)
Longstanding trouble with constipation. Failing OTC treatment including miralax, probiotic (activia), and laxatives PRN.  Will trial linzess, stop if abd pain or vomiting develops.

## 2020-04-18 NOTE — Assessment & Plan Note (Signed)
Pt endorses ongoing nausea for years without abd pain or vomiting. Also with h/o chronic constipation  Check labs today.  ?gastroparesis - could consider reglan trial.

## 2020-04-18 NOTE — Assessment & Plan Note (Signed)
Chronic, on high dose lovastatin as unable to tolerate stronger potency statins. Levels remain elevated - reviewed diet choices to improve cholesterol control.  The 10-year ASCVD risk score Mikey Bussing DC Brooke Bonito., et al., 2013) is: 13.6%   Values used to calculate the score:     Age: 75 years     Sex: Female     Is Non-Hispanic African American: No     Diabetic: No     Tobacco smoker: No     Systolic Blood Pressure: 606 mmHg     Is BP treated: No     HDL Cholesterol: 69.3 mg/dL     Total Cholesterol: 222 mg/dL

## 2020-04-18 NOTE — Assessment & Plan Note (Signed)
Appreciate thoracic surgery and oncology care.  No need for chemo or XRT  Planned routine surveillance going onward.

## 2020-04-18 NOTE — Assessment & Plan Note (Signed)
Asxs. Ex smoker. Stable period off medication.

## 2020-04-18 NOTE — Assessment & Plan Note (Addendum)
Recent readings returned high, b12 replacement currently on hold.

## 2020-04-18 NOTE — Patient Instructions (Addendum)
Labs today  Pneumovax today  Complete skin cancer screen.  Schedule eye exam and dentist appointment.  Try linzess for chronic constipation without abdominal pain.  Return in 6 months for follow up visit.   Health Maintenance After Age 76 After age 67, you are at a higher risk for certain long-term diseases and infections as well as injuries from falls. Falls are a major cause of broken bones and head injuries in people who are older than age 54. Getting regular preventive care can help to keep you healthy and well. Preventive care includes getting regular testing and making lifestyle changes as recommended by your health care provider. Talk with your health care provider about:  Which screenings and tests you should have. A screening is a test that checks for a disease when you have no symptoms.  A diet and exercise plan that is right for you. What should I know about screenings and tests to prevent falls? Screening and testing are the best ways to find a health problem early. Early diagnosis and treatment give you the best chance of managing medical conditions that are common after age 74. Certain conditions and lifestyle choices may make you more likely to have a fall. Your health care provider may recommend:  Regular vision checks. Poor vision and conditions such as cataracts can make you more likely to have a fall. If you wear glasses, make sure to get your prescription updated if your vision changes.  Medicine review. Work with your health care provider to regularly review all of the medicines you are taking, including over-the-counter medicines. Ask your health care provider about any side effects that may make you more likely to have a fall. Tell your health care provider if any medicines that you take make you feel dizzy or sleepy.  Osteoporosis screening. Osteoporosis is a condition that causes the bones to get weaker. This can make the bones weak and cause them to break more  easily.  Blood pressure screening. Blood pressure changes and medicines to control blood pressure can make you feel dizzy.  Strength and balance checks. Your health care provider may recommend certain tests to check your strength and balance while standing, walking, or changing positions.  Foot health exam. Foot pain and numbness, as well as not wearing proper footwear, can make you more likely to have a fall.  Depression screening. You may be more likely to have a fall if you have a fear of falling, feel emotionally low, or feel unable to do activities that you used to do.  Alcohol use screening. Using too much alcohol can affect your balance and may make you more likely to have a fall. What actions can I take to lower my risk of falls? General instructions  Talk with your health care provider about your risks for falling. Tell your health care provider if: ? You fall. Be sure to tell your health care provider about all falls, even ones that seem minor. ? You feel dizzy, sleepy, or off-balance.  Take over-the-counter and prescription medicines only as told by your health care provider. These include any supplements.  Eat a healthy diet and maintain a healthy weight. A healthy diet includes low-fat dairy products, low-fat (lean) meats, and fiber from whole grains, beans, and lots of fruits and vegetables. Home safety  Remove any tripping hazards, such as rugs, cords, and clutter.  Install safety equipment such as grab bars in bathrooms and safety rails on stairs.  Keep rooms and walkways well-lit. Activity  Follow a regular exercise program to stay fit. This will help you maintain your balance. Ask your health care provider what types of exercise are appropriate for you.  If you need a cane or walker, use it as recommended by your health care provider.  Wear supportive shoes that have nonskid soles.   Lifestyle  Do not drink alcohol if your health care provider tells you not to  drink.  If you drink alcohol, limit how much you have: ? 0-1 drink a day for women. ? 0-2 drinks a day for men.  Be aware of how much alcohol is in your drink. In the [place] one drink equals one typical bottle of beer (12 oz), one-half glass of wine (5 oz), or one shot of hard liquor (1 oz).  Do not use any products that contain nicotine or tobacco, such as cigarettes and e-cigarettes. If you need help quitting, ask your health care provider. Summary  Having a healthy lifestyle and getting preventive care can help to protect your health and wellness after age 36.  Screening and testing are the best way to find a health problem early and help you avoid having a fall. Early diagnosis and treatment give you the best chance for managing medical conditions that are more common for people who are older than age 13.  Falls are a major cause of broken bones and head injuries in people who are older than age 40. Take precautions to prevent a fall at home.  Work with your health care provider to learn what changes you can make to improve your health and wellness and to prevent falls. This information is not intended to replace advice given to you by your health care provider. Make sure you discuss any questions you have with your health care provider. Document Revised: 04/29/2018 Document Reviewed: 11/19/2016 Elsevier Patient Education  2021 [employer].

## 2020-04-18 NOTE — Assessment & Plan Note (Signed)
Reviewed latest DEXA. Encouraged regular fosamax use.

## 2020-04-18 NOTE — Assessment & Plan Note (Signed)
Levels have remained normal on repeat testing.

## 2020-04-18 NOTE — Progress Notes (Signed)
Patient ID: Cassie Shaw, female    DOB: 05/01/1945, 75 y.o.   MRN: [id number]  This visit was conducted in person.  BP 116/72   Pulse 78   Temp (!) 97.5 F (36.4 C) (Temporal)   Ht 5\' 4"  (1.626 m)   Wt 130 lb 3 oz (59.1 kg)   SpO2 99%   BMI 22.35 kg/m    CC: CPE, hosp f/u visit  Subjective:   HPI: Cassie Shaw is a 75 y.o. female presenting on 04/18/2020 for Follow-up (Here for post-op f/u.  Pt still is a little sore and fatigued. 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 Saw onc in f/u - no need for adjuvant chemo therapy or XRT. Planned surveillance CT scans Q68mo for 3 yrs then yearly through onc. Saw thoracic surgery in f/u as well - planned prednisone course for ongoing cough.   Recent labs from 03/17/2020 reviewed: Hgb 10.6, Alb 3.1, Cr 0.68.   Chronic nausea for 2 yrs as well as chronic constipation - compazine course with limited benefit. No vomiting with it. She's treating with ginger and honey candies from pharmacy. No abd pain, no blood in stool, no buoyant or pale stools.   Due for CPE  Preventative: COLONOSCOPY 08/2017 - 5 TAs, rpt 5 yrs [facility]) - will likely age out  Mammogram 04/2019 Birads1 @ [facility]  Well woman exam -s/p partial hysterectomy 1975 for heavy bleeding, ovaries remain. No pelvic pain or vaginal bleeding.  Lung cancer screening -see above, s/p treatment for recently found stage Ia lung cancer.  DEXA 01/2013 T -3.5 spine, -2.5 hip DEXA 03/2019 T -3.4 spine, -3.4 hip, -3.9 forearm - on fosamax, however doesn't remember to take regularly.  Flu shot -yearly [place] 02/2019, 03/2019, booster 10/2019 Td 2007 Prevnar - 06/2017 Pneumovax - today Shingrix - 06/2017, 08/2017 Advanced directive discussion -would want daughter [name] to be HCPOA. Ok with temporary measure, doesn't want prolonged life support if terminal condition.  Packet previously provided Seat belt use discussed Sunscreen use discussed. No changing moles on skin. Would like to return to derm.  Smoking -ex smoker quit remotely  Alcohol - none Dentist - due  Eye exam -due  Bowel - chronic constipation managed with activia and miralax and laxatives PRN. Interested in trial of Rx med.  Bladder - no incontinence   Lives with husband Cassie Shaw), 2 dogs and a bird Occupation: retired, was Armed forces logistics/support/administrative officer  Activity: regular treadmill use - walks 1 mi/day Diet: good water, fruits/vegetables daily     Relevant past medical, surgical, family and social history reviewed and updated as indicated. Interim medical history since our last visit reviewed. Allergies and medications reviewed and updated. Outpatient Medications Prior to Visit  Medication Sig Dispense Refill  . acetaminophen (TYLENOL) 325 MG tablet Take 650 mg by mouth every 6 (six) hours as needed.    Marland Kitchen alendronate (FOSAMAX) 70 MG tablet TAKE 1 TABLET BY MOUTH ONE TIME PER WEEK 12 tablet 1  . Calcium Carb-Cholecalciferol (CALCIUM-VITAMIN D) 600-400 MG-UNIT TABS Take 1 tablet by mouth daily.    . Cholecalciferol (VITAMIN D3) 1000 units CAPS Take 1 capsule (1,000 Units total) by mouth daily. 30 capsule   . diphenhydrAMINE (SOMINEX) 25 MG tablet Take 25 mg by mouth at bedtime as needed for sleep.    Marland Kitchen gabapentin (NEURONTIN) 600 MG tablet TAKE 1 TABLET  BY MOUTH TWICE A DAY 180 tablet 1  . HYDROmorphone (DILAUDID) 2 MG tablet Take by mouth every 4 (four) hours as needed for severe pain. Takes 1/2 tablet    . ibuprofen (ADVIL) 200 MG tablet Take 200 mg by mouth 2 (two) times daily as needed for moderate pain.    Marland Kitchen lovastatin (MEVACOR) 40 MG tablet TAKE 1 TABLET BY MOUTH EVERYDAY AT BEDTIME 90 tablet 0  . melatonin 1 MG TABS tablet Take 3 mg by mouth at bedtime.    . Multiple Vitamin (MULTIVITAMIN) tablet Take 1 tablet by mouth daily.    . predniSONE (STERAPRED UNI-PAK 21 TAB) 10 MG (21) TBPK tablet Take 6  tablets (60 mg total) by mouth daily for 1 day, THEN 5 tablets (50 mg total) daily for 1 day, THEN 4 tablets (40 mg total) daily for 1 day, THEN 3 tablets (30 mg total) daily for 1 day, THEN 2 tablets (20 mg total) daily for 1 day, THEN 1 tablet (10 mg total) daily for 1 day. 21 tablet 0  . vitamin B-12 (CYANOCOBALAMIN) 1000 MCG tablet Take 1 tablet (1,000 mcg total) by mouth every Monday, Wednesday, and Friday. (Patient not taking: Reported on 04/18/2020)     No facility-administered medications prior to visit.     Per HPI unless specifically indicated in ROS section below Review of Systems  Constitutional: Negative for activity change, appetite change, chills, fatigue, fever and unexpected weight change.  HENT: Negative for hearing loss.   Eyes: Negative for visual disturbance.  Respiratory: Positive for cough and shortness of breath. Negative for chest tightness and wheezing.   Cardiovascular: Negative for chest pain, palpitations and leg swelling.  Gastrointestinal: Positive for constipation and nausea. Negative for abdominal distention, abdominal pain, blood in stool, diarrhea and vomiting.  Genitourinary: Negative for difficulty urinating and hematuria.  Musculoskeletal: Negative for arthralgias, myalgias and neck pain.  Skin: Negative for rash.  Neurological: Negative for dizziness, seizures, syncope and headaches.  Hematological: Negative for adenopathy. Does not bruise/bleed easily.  Psychiatric/Behavioral: Negative for dysphoric mood. The patient is not nervous/anxious.    Objective:  BP 116/72   Pulse 78   Temp (!) 97.5 F (36.4 C) (Temporal)   Ht 5\' 4"  (1.626 m)   Wt 130 lb 3 oz (59.1 kg)   SpO2 99%   BMI 22.35 kg/m   Wt Readings from Last 3 Encounters:  04/18/20 130 lb 3 oz (59.1 kg)  04/17/20 130 lb (59 kg)  03/30/20 129 lb 8 oz (58.7 kg)      Physical Exam Vitals and nursing note reviewed.  Constitutional:      General: She is not in acute distress.     Appearance: Normal appearance. She is well-developed. She is not ill-appearing.  HENT:     Head: Normocephalic and atraumatic.     Right Ear: Hearing, tympanic membrane, ear canal and external ear normal.     Left Ear: Hearing, tympanic membrane, ear canal and external ear normal.     Mouth/Throat:     Pharynx: Uvula midline.  Eyes:     General: No scleral icterus.    Extraocular Movements: Extraocular movements intact.     Conjunctiva/sclera: Conjunctivae normal.     Pupils: Pupils are equal, round, and reactive to light.  Cardiovascular:     Rate and Rhythm: Normal rate and regular rhythm.     Pulses: Normal pulses.          Radial pulses are 2+ on the right  side and 2+ on the left side.     Heart sounds: Normal heart sounds. No murmur heard.   Pulmonary:     Effort: Pulmonary effort is normal. No respiratory distress.     Breath sounds: Normal breath sounds. No wheezing, rhonchi or rales.  Abdominal:     General: Abdomen is flat. Bowel sounds are normal. There is no distension.     Palpations: Abdomen is soft. There is no mass.     Tenderness: There is no abdominal tenderness. There is no guarding or rebound.     Hernia: No hernia is present.  Musculoskeletal:        General: Normal range of motion.     Cervical back: Normal range of motion and neck supple.     Right lower leg: No edema.     Left lower leg: No edema.  Lymphadenopathy:     Cervical: No cervical adenopathy.  Skin:    General: Skin is warm and dry.     Findings: No rash.  Neurological:     General: No focal deficit present.     Mental Status: She is alert and oriented to person, place, and time.     Comments: CN grossly intact, station and gait intact  Psychiatric:        Mood and Affect: Mood normal.        Behavior: Behavior normal.        Thought Content: Thought content normal.        Judgment: Judgment normal.       Lab Results  Component Value Date   CHOL 222 (H) 03/07/2020   HDL 69.30  03/07/2020   LDLDIRECT 124.0 03/07/2020   TRIG 245.0 (H) 03/07/2020   CHOLHDL 3 03/07/2020   Lab Results  Component Value Date   VITAMINB12 >1506 (H) 03/07/2020    Assessment & Plan:  This visit occurred during the SARS-CoV-2 public health emergency.  Safety protocols were in place, including screening questions prior to the visit, additional usage of staff PPE, and extensive cleaning of exam room while observing appropriate contact time as indicated for disinfecting solutions.   Problem List Items Addressed This Visit    IBS (irritable bowel syndrome)    Longstanding trouble with constipation. Failing OTC treatment including miralax, probiotic (activia), and laxatives PRN.  Will trial linzess, stop if abd pain or vomiting develops.       Relevant Medications   linaclotide (LINZESS) 145 MCG CAPS capsule   Dyslipidemia    Chronic, on high dose lovastatin as unable to tolerate stronger potency statins. Levels remain elevated - reviewed diet choices to improve cholesterol control.  The 10-year ASCVD risk score Mikey Bussing DC Brooke Bonito., et al., 2013) is: 13.6%   Values used to calculate the score:     Age: 75 years     Sex: Female     Is Non-Hispanic African American: No     Diabetic: No     Tobacco smoker: No     Systolic Blood Pressure: 580 mmHg     Is BP treated: No     HDL Cholesterol: 69.3 mg/dL     Total Cholesterol: 222 mg/dL       Osteoporosis    Reviewed latest DEXA. Encouraged regular fosamax use.       Vitamin B12 deficiency    Recent readings returned high, b12 replacement currently on hold.       Chronic nausea    Pt endorses ongoing nausea for years without abd  pain or vomiting. Also with h/o chronic constipation  Check labs today.  ?gastroparesis - could consider reglan trial.       Health maintenance examination - Primary    Preventative protocols reviewed and updated unless pt declined. Discussed healthy diet and lifestyle.       COPD (chronic obstructive  pulmonary disease) ([place])    Asxs. Ex smoker. Stable period off medication.       Thoracic aortic atherosclerosis ([facility])    Continue statin.       Abnormal TSH    Levels have remained normal on repeat testing.       Squamous cell lung cancer, left [facility])    Appreciate thoracic surgery and oncology care.  No need for chemo or XRT  Planned routine surveillance going onward.       Chronic constipation    Longstanding. See above.  Check labs today (CBC, CMP, lipase).  Trial linzess      Relevant Orders   Comprehensive metabolic panel   CBC with Differential/Platelet   Lipase    Other Visit Diagnoses    Need for 23-polyvalent pneumococcal polysaccharide vaccine       Relevant Orders   Pneumococcal polysaccharide vaccine 23-valent greater than or equal to 2yo subcutaneous/IM (Completed)       Meds ordered this encounter  Medications  . linaclotide (LINZESS) 145 MCG CAPS capsule    Sig: Take 1 capsule (145 mcg total) by mouth daily before breakfast. For constipation    Dispense:  30 capsule    Refill:  3   Orders Placed This Encounter  Procedures  . Pneumococcal polysaccharide vaccine 23-valent greater than or equal to 2yo subcutaneous/IM  . Comprehensive metabolic panel  . CBC with Differential/Platelet  . Lipase    Patient instructions: Labs today  Pneumovax today  Complete skin cancer screen.  Schedule eye exam and dentist appointment.  Try linzess for chronic constipation without abdominal pain.  Return in 6 months for follow up visit.   Follow up plan: Return in about 6 months (around 10/19/2020) for follow up visit.  Ria Bush, MD

## 2020-04-18 NOTE — Assessment & Plan Note (Addendum)
Longstanding. See above.  Check labs today (CBC, CMP, lipase).  Trial linzess

## 2020-04-18 NOTE — Assessment & Plan Note (Signed)
Continue statin. 

## 2020-04-19 ENCOUNTER — Telehealth: Payer: Self-pay

## 2020-04-19 NOTE — Telephone Encounter (Signed)
Received faxed message from [facility] stating a PA is required for Linzess 145 mg cap.  However, lactulose is covered.    Do you want me to submit the PA?

## 2020-04-19 NOTE — Telephone Encounter (Addendum)
Yes please for chronic constipation and IBS-C.

## 2020-04-20 NOTE — Telephone Encounter (Signed)
Submitted PA for Linzess 145 mcg; key:  [id number], PA case ID:  [id number], Rx #:  [id number].  Decision pending.

## 2020-04-23 NOTE — Telephone Encounter (Signed)
Received faxed PA approval, valid 03/21/20- 04/20/2021.  Made pharmacy aware of approval.

## 2020-05-28 ENCOUNTER — Other Ambulatory Visit: Payer: Self-pay

## 2020-05-28 ENCOUNTER — Ambulatory Visit (INDEPENDENT_AMBULATORY_CARE_PROVIDER_SITE_OTHER): Payer: Federal, State, Local not specified - PPO | Admitting: Family Medicine

## 2020-05-28 ENCOUNTER — Encounter: Payer: Self-pay | Admitting: Family Medicine

## 2020-05-28 VITALS — BP 124/80 | HR 92 | Temp 98.1°F | Ht 64.0 in | Wt 129.4 lb

## 2020-05-28 DIAGNOSIS — E538 Deficiency of other specified B group vitamins: Secondary | ICD-10-CM

## 2020-05-28 DIAGNOSIS — R208 Other disturbances of skin sensation: Secondary | ICD-10-CM | POA: Diagnosis not present

## 2020-05-28 DIAGNOSIS — R748 Abnormal levels of other serum enzymes: Secondary | ICD-10-CM

## 2020-05-28 DIAGNOSIS — R0789 Other chest pain: Secondary | ICD-10-CM | POA: Diagnosis not present

## 2020-05-28 DIAGNOSIS — C3492 Malignant neoplasm of unspecified part of left bronchus or lung: Secondary | ICD-10-CM

## 2020-05-28 MED ORDER — NORTRIPTYLINE HCL 25 MG PO CAPS: 25.0000 mg | ORAL_CAPSULE | Freq: Every day | ORAL | 3 refills | Status: DC

## 2020-05-28 MED ORDER — METHOCARBAMOL 500 MG PO TABS: 500.0000 mg | ORAL_TABLET | Freq: Two times a day (BID) | ORAL | 0 refills | Status: DC | PRN

## 2020-05-28 NOTE — Progress Notes (Addendum)
Patient ID: AIBHLINN KALMAR, female    DOB: 07/22/1945, 75 y.o.   MRN: [id number]  This visit was conducted in person.  BP 124/80   Pulse 92   Temp 98.1 F (36.7 C) (Temporal)   Ht 5\' 4"  (1.626 m)   Wt 129 lb 7 oz (58.7 kg)   SpO2 97%   BMI 22.22 kg/m    CC: back pain and ongoing pain of feet Subjective:   HPI: Cassie Shaw is a 75 y.o. female presenting on 05/28/2020 for Back Injury (C/o right low back pain.  Started on 05/21/20.  Pulled muscle when bending over to pick up a back with heavy items.  Tried old hydromorphone rx, helpful. ) and Foot Pain (C/o burning in bottom of bilateral feet.  Started yrs ago, worsening.  Tried several creams that no longer work.)   DOI: early last week Pulled R thoracic back when she went to lift groceries to bring them into the house. Severe pain, worse at night time - took left over hydromorphone with relief. Pain is positional, reproducible.   Also - ongoing chronic burning pain of soles of bilateral feet - at times severe.  Compounded cream from [facility] in [place] was beneficial for a few days but then lost effect.  Continues TENS unit, foot vibration, tylenol, gabapentin.  Brings multiple topical treatments tried to date: neuropathy rubbing oil and lotion (Frankincense&Myrrh brand), Magnilife foot spray and cream, and NeuropAWAY foot cream.   H/o lung cancer - s/p full excision. Did not need adjuvant treatment or XRT.      Relevant past medical, surgical, family and social history reviewed and updated as indicated. Interim medical history since our last visit reviewed. Allergies and medications reviewed and updated. Outpatient Medications Prior to Visit  Medication Sig Dispense Refill  . acetaminophen (TYLENOL) 325 MG tablet Take 650 mg by mouth every 6 (six) hours as needed.    Marland Kitchen alendronate (FOSAMAX) 70 MG tablet TAKE 1 TABLET BY MOUTH ONE TIME PER WEEK 12 tablet 1  . Calcium Carb-Cholecalciferol (CALCIUM-VITAMIN D) 600-400 MG-UNIT  TABS Take 1 tablet by mouth daily.    . Cholecalciferol (VITAMIN D3) 1000 units CAPS Take 1 capsule (1,000 Units total) by mouth daily. 30 capsule   . gabapentin (NEURONTIN) 600 MG tablet TAKE 1 TABLET BY MOUTH TWICE A DAY 180 tablet 1  . ibuprofen (ADVIL) 200 MG tablet Take 200 mg by mouth 2 (two) times daily as needed for moderate pain.    Marland Kitchen lovastatin (MEVACOR) 40 MG tablet TAKE 1 TABLET BY MOUTH EVERYDAY AT BEDTIME 90 tablet 0  . melatonin 1 MG TABS tablet Take 3 mg by mouth at bedtime.    . Multiple Vitamin (MULTIVITAMIN) tablet Take 1 tablet by mouth daily.    . vitamin B-12 (CYANOCOBALAMIN) 1000 MCG tablet Take 1 tablet (1,000 mcg total) by mouth every Monday, Wednesday, and Friday.    . linaclotide (LINZESS) 145 MCG CAPS capsule Take 1 capsule (145 mcg total) by mouth daily before breakfast. For constipation (Patient not taking: Reported on 05/28/2020) 30 capsule 3  . diphenhydrAMINE (SOMINEX) 25 MG tablet Take 25 mg by mouth at bedtime as needed for sleep.    Marland Kitchen HYDROmorphone (DILAUDID) 2 MG tablet Take by mouth every 4 (four) hours as needed for severe pain. Takes 1/2 tablet     No facility-administered medications prior to visit.     Per HPI unless specifically indicated in ROS section below Review of Systems Objective:  BP 124/80   Pulse 92   Temp 98.1 F (36.7 C) (Temporal)   Ht 5\' 4"  (1.626 m)   Wt 129 lb 7 oz (58.7 kg)   SpO2 97%   BMI 22.22 kg/m   Wt Readings from Last 3 Encounters:  05/28/20 129 lb 7 oz (58.7 kg)  04/18/20 130 lb 3 oz (59.1 kg)  04/17/20 130 lb (59 kg)      Physical Exam Vitals and nursing note reviewed.  Constitutional:      Appearance: Normal appearance. She is not ill-appearing.  Cardiovascular:     Rate and Rhythm: Normal rate and regular rhythm.     Pulses: Normal pulses.     Heart sounds: Normal heart sounds. No murmur heard.   Pulmonary:     Effort: Pulmonary effort is normal. No respiratory distress.     Breath sounds: Normal breath  sounds. No wheezing, rhonchi or rales.     Comments: Well healed incisions to chest from recent lung cancer excisional surgery Musculoskeletal:        General: No tenderness. Normal range of motion.     Right lower leg: No edema.     Left lower leg: No edema.     Comments:  2+ DP bilaterally Reproducible pain to palpation to R posterior inferior ribcage region without appreciable rash, swelling  Skin:    General: Skin is warm and dry.     Findings: No erythema or rash.  Neurological:     Mental Status: She is alert.  Psychiatric:        Mood and Affect: Mood normal.        Behavior: Behavior normal.       Assessment & Plan:  This visit occurred during the SARS-CoV-2 public health emergency.  Safety protocols were in place, including screening questions prior to the visit, additional usage of staff PPE, and extensive cleaning of exam room while observing appropriate contact time as indicated for disinfecting solutions.   Problem List Items Addressed This Visit    Burning sensation of foot    Chronic, longstanding brning pain to distal soles of bilateral feet.  Presumed peripheral neuropathy although NCS normal 09/2015.  On gabapentin 600mg  BID without significant improvement. Will add on TCA - reviewed possible side effects including but not limited to dry mouth, worsening constipation, sedation, weight gain. Update with effect of nortriptyline 25mg  nightly.  Check B5 vitamin at next labs      Relevant Orders   Vitamin B5   Vitamin B12 deficiency    b12 on hold since last checked elevated 02/2020 - will recheck next labs.       Relevant Orders   Vitamin B12   Intrinsic Factor Antibodies   Right-sided chest wall pain - Primary    Started after lifting heavy groceries - anticipate ribcage strain. Reviewed supportive care measures with patient - ice/heat, Rx robaxin muscle relaxant with sedation precautions. Update if not improving with treatment.       Squamous cell lung  cancer, left (HCC)    History of lung cancer discovered through screening program s/p resection 02/2020 - overall doing well.        Low serum alkaline phosphatase    Chronically low for years. TSH normal on last check 02/2020. Consider further evaluation.           Meds ordered this encounter  Medications  . methocarbamol (ROBAXIN) 500 MG tablet    Sig: Take 1 tablet (500 mg total) by mouth  2 (two) times daily as needed for muscle spasms (sedation precautions).    Dispense:  20 tablet    Refill:  0  . nortriptyline (PAMELOR) 25 MG capsule    Sig: Take 1 capsule (25 mg total) by mouth at bedtime.    Dispense:  30 capsule    Refill:  3   Orders Placed This Encounter  Procedures  . Vitamin B5    Standing Status:   Future    Standing Expiration Date:   05/31/2021  . Vitamin B12    Standing Status:   Future    Standing Expiration Date:   05/31/2021  . Intrinsic Factor Antibodies    Standing Status:   Future    Standing Expiration Date:   05/31/2021    Patient Instructions  For right lower ribcage strain - may use heating pad as needed, try robaxin muscle relaxant twice daily as needed for muscle pain/spasm, caution it can make you sleepy.  For foot pain - restart nortriptyline 25mg  nightly. This can help foot burning pain and help you sleep.    Follow up plan: No follow-ups on file.  Ria Bush, MD

## 2020-05-28 NOTE — Patient Instructions (Addendum)
For right lower ribcage strain - may use heating pad as needed, try robaxin muscle relaxant twice daily as needed for muscle pain/spasm, caution it can make you sleepy.  For foot pain - restart nortriptyline 25mg  nightly. This can help foot burning pain and help you sleep.

## 2020-05-31 ENCOUNTER — Telehealth: Payer: Self-pay | Admitting: Family Medicine

## 2020-05-31 DIAGNOSIS — R748 Abnormal levels of other serum enzymes: Secondary | ICD-10-CM | POA: Insufficient documentation

## 2020-05-31 NOTE — Telephone Encounter (Signed)
Lvm asking pt to call back.  Needs to schedule non-fasting lab visit at her convenience, per Dr. Darnell Level.

## 2020-05-31 NOTE — Assessment & Plan Note (Addendum)
Chronic, longstanding brning pain to distal soles of bilateral feet.  Presumed peripheral neuropathy although NCS normal 09/2015.  On gabapentin 600mg  BID without significant improvement. Will add on TCA - reviewed possible side effects including but not limited to dry mouth, worsening constipation, sedation, weight gain. Update with effect of nortriptyline 25mg  nightly.  Check B5 vitamin at next labs

## 2020-05-31 NOTE — Assessment & Plan Note (Addendum)
History of lung cancer discovered through screening program s/p resection 02/2020 - overall doing well.

## 2020-05-31 NOTE — Assessment & Plan Note (Signed)
Started after lifting heavy groceries - anticipate ribcage strain. Reviewed supportive care measures with patient - ice/heat, Rx robaxin muscle relaxant with sedation precautions. Update if not improving with treatment.

## 2020-05-31 NOTE — Assessment & Plan Note (Addendum)
Chronically low for years. TSH normal on last check 02/2020. Consider further evaluation.

## 2020-05-31 NOTE — Telephone Encounter (Signed)
plz call - i'd like her to come in at her convenience for lab work (nonfasting) to check some vitamin levels to further evaluate for burning foot pain.

## 2020-05-31 NOTE — Addendum Note (Signed)
Addended by: Ria Bush on: 05/31/2020 11:36 AM   Modules accepted: Orders

## 2020-05-31 NOTE — Assessment & Plan Note (Signed)
b12 on hold since last checked elevated 02/2020 - will recheck next labs.

## 2020-06-01 ENCOUNTER — Other Ambulatory Visit: Payer: Self-pay

## 2020-06-01 ENCOUNTER — Other Ambulatory Visit (INDEPENDENT_AMBULATORY_CARE_PROVIDER_SITE_OTHER): Payer: Federal, State, Local not specified - PPO

## 2020-06-01 DIAGNOSIS — E538 Deficiency of other specified B group vitamins: Secondary | ICD-10-CM

## 2020-06-01 DIAGNOSIS — R208 Other disturbances of skin sensation: Secondary | ICD-10-CM

## 2020-06-01 LAB — VITAMIN B12: Vitamin B-12: 1011 pg/mL — ABNORMAL HIGH (ref 211–911)

## 2020-06-01 NOTE — Telephone Encounter (Signed)
Pt scheduled today for labs at 11:30.

## 2020-06-04 LAB — INTRINSIC FACTOR ANTIBODIES: Intrinsic Factor: NEGATIVE

## 2020-06-08 LAB — VITAMIN B5: Vitamin B5: 90.7 ng/mL (ref 12.9–253.1)

## 2020-06-13 ENCOUNTER — Other Ambulatory Visit: Payer: Self-pay | Admitting: Thoracic Surgery (Cardiothoracic Vascular Surgery)

## 2020-06-13 DIAGNOSIS — C3490 Malignant neoplasm of unspecified part of unspecified bronchus or lung: Secondary | ICD-10-CM

## 2020-06-19 ENCOUNTER — Other Ambulatory Visit: Payer: Self-pay | Admitting: Family Medicine

## 2020-06-19 ENCOUNTER — Ambulatory Visit
Admission: RE | Admit: 2020-06-19 | Discharge: 2020-06-19 | Disposition: A | Discharge status: Home / Self Care | Payer: Federal, State, Local not specified - PPO | Source: Ambulatory Visit | Attending: Thoracic Surgery (Cardiothoracic Vascular Surgery) | Admitting: Thoracic Surgery (Cardiothoracic Vascular Surgery)

## 2020-06-19 ENCOUNTER — Ambulatory Visit: Payer: Federal, State, Local not specified - PPO | Admitting: Thoracic Surgery (Cardiothoracic Vascular Surgery)

## 2020-06-19 ENCOUNTER — Encounter: Payer: Self-pay | Admitting: Thoracic Surgery (Cardiothoracic Vascular Surgery)

## 2020-06-19 ENCOUNTER — Other Ambulatory Visit: Payer: Self-pay

## 2020-06-19 VITALS — BP 123/83 | HR 95 | Resp 20 | Ht 64.0 in | Wt 129.4 lb

## 2020-06-19 DIAGNOSIS — C3490 Malignant neoplasm of unspecified part of unspecified bronchus or lung: Secondary | ICD-10-CM

## 2020-06-19 DIAGNOSIS — Z9889 Other specified postprocedural states: Secondary | ICD-10-CM | POA: Diagnosis not present

## 2020-06-19 NOTE — Progress Notes (Signed)
[address]       [place] [address]             [phone]     HPI: Mrs. Ludden returns for a scheduled postoperative follow-up visit  Marianna Cid is a 75 year old woman with a history of tobacco abuse, peripheral neuropathy, hyperlipidemia, vitamin B12 deficiency, osteoporosis, irritable bowel syndrome, and fibromyalgia.  She was found to have a lung nodule on a low-dose screening CT in October 2021.  A follow-up CT showed the nodule had increased in size.  She had a PET which showed mild uptake.  I did a robotic left lower lobectomy on 03/15/2020.  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  She did well postoperatively and went home on day 5.  I saw her on 03/27/2020.  She was having a lot of pain.  She had a prescription for gabapentin but was only taking it once a day.  It had been prescribed for twice a day.  I recommended she increase that and also gave her a prescription for Dilaudid.  She also was having a lot of trouble with coughing.  Saw her back on 3/29 and she was feeling better from a pain standpoint but continued to have cough.  I gave her a prednisone taper for that.  In the interim since her last visit she is feeling much better.  Her primary complaint currently is burning pain in her feet due to peripheral neuropathy.  That is making it hard for her to sleep at night.  She is not having much incisional pain at all.  She is not having any respiratory issues.  Past Medical History:  Diagnosis Date  . Arthritis   . B12 deficiency   . COPD (chronic obstructive pulmonary disease) ([place])   . Ex-smoker   . Fibrocystic breast disease   . Fibromyalgia   . History of chicken pox   . History of depression    and anxiety  . HLD (hyperlipidemia)   . IBS (irritable bowel syndrome)   . Osteoporosis 2015   on/off fosamax DEXA T -3.5 spine, -2.5 hip  . Peripheral neuropathy   . Retinal detachment     Current Outpatient Medications  Medication Sig  Dispense Refill  . acetaminophen (TYLENOL) 325 MG tablet Take 650 mg by mouth every 6 (six) hours as needed.    Marland Kitchen alendronate (FOSAMAX) 70 MG tablet TAKE 1 TABLET BY MOUTH ONE TIME PER WEEK 12 tablet 1  . Cholecalciferol (VITAMIN D3) 1000 units CAPS Take 1 capsule (1,000 Units total) by mouth daily. 30 capsule   . gabapentin (NEURONTIN) 600 MG tablet TAKE 1 TABLET BY MOUTH TWICE A DAY 180 tablet 1  . ibuprofen (ADVIL) 200 MG tablet Take 200 mg by mouth 2 (two) times daily as needed for moderate pain.    Marland Kitchen lovastatin (MEVACOR) 40 MG tablet TAKE 1 TABLET BY MOUTH EVERYDAY AT BEDTIME 90 tablet 0  . melatonin 1 MG TABS tablet Take 3 mg by mouth at bedtime.    . Multiple Vitamin (MULTIVITAMIN) tablet Take 1 tablet by mouth daily.    . nortriptyline (PAMELOR) 25 MG capsule Take 1 capsule (25 mg total) by mouth at bedtime. 30 capsule 3   No current facility-administered medications for this visit.    Physical Exam BP 123/83 (BP Location: Right Arm, Patient Position: Sitting, Cuff Size: Normal)   Pulse 95   Resp 20   Ht 5\' 4"  (1.626 m)  Wt 129 lb 6.4 oz (58.7 kg)   SpO2 98% Comment: RA  BMI 22.42 kg/m  75 year old woman in no acute distress Alert and oriented x3 with no focal deficits Lungs diminished at left base but otherwise clear Incisions well-healed  Diagnostic Tests: I personally reviewed her chest x-ray.  Shows postoperative changes from left lower lobectomy.  Otherwise unremarkable.  Impression: Dyna Figuereo is a 75 year old woman with a history of tobacco abuse, peripheral neuropathy, hyperlipidemia, vitamin B12 deficiency, osteoporosis, irritable bowel syndrome, and fibromyalgia.  She was found to have a lung nodule on a low-dose screening CT in October 2021.  A follow-up CT showed the nodule had increased in size.  She had a PET which showed mild uptake.  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  No indication for adjuvant therapy.  She has had a consultation  with Dr. Janese Banks.  She scheduled to have a CT in September.  I emphasized the importance of continued follow-up.  Postoperative pain-improved.  Peripheral neuropathy-primary complaint currently is burning pain in her feet.  She is followed by Dr. Danise Mina for that.   Plan: Follow-up with Dr. Danise Mina and Dr. Janese Banks I will be happy to see Mrs. Bennis back anytime in the future if I can be of any further assistance with her care  Melrose Nakayama, MD [facility] [phone]

## 2020-06-20 NOTE — Telephone Encounter (Signed)
Called patient she is not taking any longer. She states gave her loose bowels and she stopped. Refill not needed.

## 2020-06-21 NOTE — Telephone Encounter (Signed)
ERx 

## 2020-06-21 NOTE — Telephone Encounter (Signed)
Cassie Shaw called in and stated that someone called her about a medication and she thought it was for another medication. But she does need the nortriptyline but she did state that it doesn't work like that. She was taking 50mg  at first but now she os  25mg  and she stated that they work the same.

## 2020-07-05 ENCOUNTER — Telehealth: Payer: Self-pay | Admitting: *Deleted

## 2020-07-05 NOTE — Telephone Encounter (Signed)
Advised pt of apt. Pt appreciative and agreed to apt.

## 2020-07-05 NOTE — Telephone Encounter (Signed)
Noted.  I agree with following our protocol for sick visits - which includes virtual visit for possible acute covid symptoms.

## 2020-07-05 NOTE — Telephone Encounter (Signed)
Pt's spouse called in asking for pt to be seen in office for cough congestion, runny nose, ST. I advised him given sxs we would have to start off doing virtual visit or at least a phone call we couldn't bring her in the office. Spouse got upset and said that she needs someone to see her in person. I advise pt if he wants pt to be seen in person he would have to take her to an UC. Spouse became upset and said i'm just putting his wife off on another provider. I advised him of our Covid protocol and given sxs we can't bring her in the office she would have to do a virtual visit or phone call visit 1st or take her to UC where they can see her in person. Spouse then hung up the call  FYI to PCP

## 2020-07-05 NOTE — Telephone Encounter (Signed)
Thank you for speaking with [name]. Agree no excuse for him yelling at our staff and I will talk with him about this.  If coughing ongoing for months without acute change or other symptoms, I think reasonable to see her in office tomorrow at 12:30pm

## 2020-07-05 NOTE — Telephone Encounter (Signed)
Spouse [name] called back asking to speak to a Freight forwarder. Mandy wasn't here and Larene Beach, RN was helping a pt. She advised me to get pt's info and she would call him back as soon as she can. I advised [name] of this info and he became upset saying that he doesn't have time to "wait around" for the manager to call him back and he wanted to know when she would call him. I advised him that I couldn't give him a time but it would be as soon as she can today. He became upset and said schedule his wife with Dr. Darnell Level, I advised them that Dr. Darnell Level isn't in the office today and his 1st available appt is next week but I could schedule her with another provider. Spouse became even more upset and said "i'm on my way up there" and hung up

## 2020-07-05 NOTE — Telephone Encounter (Signed)
Contacted pt's husband, [name], and advised he could not yell at the staff when contacting the office. He reported she lied to him and said Dr. Darnell Level could not talk to him or his wife for 5 minutes. Advised that Dr. Darnell Level was not working today and that Dr. Darnell Level does not currently have any apts available until next week. [name] said she does not need an apt he just wants to talk to him for 5 minutes. I advised the pt will need an apt. He continued to repeat that she lied to him about Dr. Darnell Level and he and his wife are thinking about getting another provider if they cannot be seen when they call and no one cares. Advised the phone person was trying to get his wife scheduled with another provider at the time of the call because this office does care about all of the pts but he can no longer yell at the staff or threaten to come to clinic and cause a scene. Advised pt this nurse was with another pt this morning when he called and everyone cannot be available for him at any moment when he calls. He asked what I was doing and I advised taking care of pts.   Tried to speak to pt but he reported she was gone to take her daughter to an apt. He reports pt has a hx of lung cancer and she has had a productive cough for 3-4 months and he is wondering if it has anything to do with maybe the lung cancer coming back even though the surgeon said it would not come back. He reports he thinks the cough has worsened, as he thinks the pt is coughing more, but he said the pt does not think so. He said she is coughing up green phlegm in the AM but it is clear the rest of the day. [name] denied the pt having any other symptoms and reports they do not have any home covid tests. Advised this nurse will contact the pt to assess. Advised again to not yell at the staff when he contacts the clinic and if he had any further problems to ask for this nurse when he called. [name] reported that is the way he is and he probably will do it again. Contacted the pt c/o  productive cough w/green phlegm in AM and clear the rest of the day with chest congestion for 3-4 months which she reports no change in intensity in this time period. Pt denies any other symptoms and reports she would not know how to perfor a covid test if she had one so she is not going to get one. Advised about the conversation with her husband this morning and the office and the conversation I had with her husband about yelling at the staff. She said she knows because she was there and that is just the way he is. She reports her symptoms are not urgent and if they are she would go to [place] but would rather see Dr. Darnell Level if possible. Advised he is not in the office today and does not have any openings until next week but he is recommending a VV and not an OV. She said she cannot do a VV but she can do a phone visit. Advised a msg would be sent to Dr. Darnell Level and this office would f/u. Advised if any symptoms worsened or she developed any new symptoms to contact the office or go to the UC. Advised pt of ER precautions.  Pt verbalized understanding.

## 2020-07-06 ENCOUNTER — Other Ambulatory Visit: Payer: Self-pay

## 2020-07-06 ENCOUNTER — Ambulatory Visit: Payer: Federal, State, Local not specified - PPO | Admitting: Family Medicine

## 2020-07-06 ENCOUNTER — Encounter: Payer: Self-pay | Admitting: Family Medicine

## 2020-07-06 VITALS — BP 122/82 | HR 95 | Temp 97.7°F | Ht 64.0 in | Wt 128.4 lb

## 2020-07-06 DIAGNOSIS — R053 Chronic cough: Secondary | ICD-10-CM | POA: Diagnosis not present

## 2020-07-06 DIAGNOSIS — R052 Subacute cough: Secondary | ICD-10-CM | POA: Insufficient documentation

## 2020-07-06 DIAGNOSIS — Z85118 Personal history of other malignant neoplasm of bronchus and lung: Secondary | ICD-10-CM | POA: Diagnosis not present

## 2020-07-06 DIAGNOSIS — J449 Chronic obstructive pulmonary disease, unspecified: Secondary | ICD-10-CM

## 2020-07-06 DIAGNOSIS — Z87891 Personal history of nicotine dependence: Secondary | ICD-10-CM

## 2020-07-06 MED ORDER — PREDNISONE 20 MG PO TABS: ORAL_TABLET | ORAL | 0 refills | Status: DC

## 2020-07-06 MED ORDER — AZITHROMYCIN 250 MG PO TABS: ORAL_TABLET | ORAL | 0 refills | Status: DC

## 2020-07-06 NOTE — Assessment & Plan Note (Addendum)
Present for 3 + months, coughing fits now causing trouble with incontinence.  Overall reassuring exam - anticipate post-inflammatory cough vs allergic - rec trial of daily antihistamine + prednisone taper and if ongoing may take zpack to empirically treat possible atypical infection. If no better with this, will recommend trial of daily respiratory medication like spiriva in known COPD hx.  Reviewed latest reassuring CXR done a few weeks ago. Did not repeat given no change in cough in the last few weeks.

## 2020-07-06 NOTE — Assessment & Plan Note (Signed)
Emphysema by prior imaging, denies significant exertional dyspnea or wheezing.  For ongoing cough, consider spiriva trial as per below.

## 2020-07-06 NOTE — Assessment & Plan Note (Signed)
S/p L lower lobe wedge resection 02/2020  Released from thoracic surgery, continues seeing oncology.

## 2020-07-06 NOTE — Progress Notes (Signed)
Patient ID: Cassie Shaw, female    DOB: 08-23-1945, 75 y.o.   MRN: [id number]  This visit was conducted in person.  BP 122/82   Pulse 95   Temp 97.7 F (36.5 C) (Temporal)   Ht 5\' 4"  (1.626 m)   Wt 128 lb 6 oz (58.2 kg)   SpO2 98%   BMI 22.04 kg/m    CC: cough x months Subjective:   HPI: Cassie Shaw is a 75 y.o. female presenting on 07/06/2020 for Cough (C/o productive cough for 3-4 mos.  Has green phlegm in AM.  States she sometimes wets her close when having she has coughing spells. )   See recent phone note.  Months of cough sometimes dry sometimes productive, associated with coughing fits - bad one yesterday.  Not associated with fevers, chills, wheezing, diaphoresis or significant dyspnea.   Treated with several bottles of cough syrup as well as mucous relief pills.   Allergic rhinitis - notes chronic itchy eyes, sneezing, rhinorrhea > head congestion - manages with OTC allergy medicine about once a week. Not using INS.   H/o COPD, largely asymptomatic.  Denies GERD sxs, abd pain.    H/o lung cancer s/p full excision, did not need adjuvant treatment or XRT.  VIDEO ASSISTED THORACOSCOPY (VATS)/WEDGE RESECTION Left  02/2020 - Robotic left video-assisted thoracoscopy, L lower lobe wedge resection, L lower lobectomy and LN dissection ([facility])  Last CXR 06/19/2020 reviewed - normal.  She did receive prednisone taper for ongoing coughing after lung surgery - she feels this was beneficial. Notes some intermittent dyspnea, worse since surgery.   Denies TB exposure. She did have TB treatment earlier in life when she lived with her uncle who had TB.      Relevant past medical, surgical, family and social history reviewed and updated as indicated. Interim medical history since our last visit reviewed. Allergies and medications reviewed and updated. Outpatient Medications Prior to Visit  Medication Sig Dispense Refill   acetaminophen (TYLENOL) 325 MG tablet Take 650 mg  by mouth every 6 (six) hours as needed.     alendronate (FOSAMAX) 70 MG tablet TAKE 1 TABLET BY MOUTH ONE TIME PER WEEK 12 tablet 1   Cholecalciferol (VITAMIN D3) 1000 units CAPS Take 1 capsule (1,000 Units total) by mouth daily. 30 capsule    gabapentin (NEURONTIN) 600 MG tablet TAKE 1 TABLET BY MOUTH TWICE A DAY 180 tablet 1   ibuprofen (ADVIL) 200 MG tablet Take 200 mg by mouth 2 (two) times daily as needed for moderate pain.     lovastatin (MEVACOR) 40 MG tablet TAKE 1 TABLET BY MOUTH EVERYDAY AT BEDTIME 90 tablet 0   melatonin 1 MG TABS tablet Take 3 mg by mouth at bedtime.     Multiple Vitamin (MULTIVITAMIN) tablet Take 1 tablet by mouth daily.     nortriptyline (PAMELOR) 25 MG capsule TAKE 1 CAPSULE BY MOUTH AT BEDTIME. 90 capsule 3   No facility-administered medications prior to visit.     Per HPI unless specifically indicated in ROS section below Review of Systems Objective:  BP 122/82   Pulse 95   Temp 97.7 F (36.5 C) (Temporal)   Ht 5\' 4"  (1.626 m)   Wt 128 lb 6 oz (58.2 kg)   SpO2 98%   BMI 22.04 kg/m   Wt Readings from Last 3 Encounters:  07/06/20 128 lb 6 oz (58.2 kg)  06/19/20 129 lb 6.4 oz (58.7 kg)  05/28/20 129 lb  7 oz (58.7 kg)      Physical Exam Vitals and nursing note reviewed.  Constitutional:      Appearance: Normal appearance. She is not ill-appearing.  Cardiovascular:     Rate and Rhythm: Normal rate and regular rhythm.     Pulses: Normal pulses.     Heart sounds: Normal heart sounds. No murmur heard. Pulmonary:     Effort: Pulmonary effort is normal. No respiratory distress.     Breath sounds: Rhonchi (R>L) present. No wheezing or rales.     Comments: Diminished breath sounds LLL at site of lobectomy Skin:    General: Skin is warm and dry.     Findings: No rash.  Neurological:     Mental Status: She is alert.  Psychiatric:        Mood and Affect: Mood normal.        Behavior: Behavior normal.      Results for orders placed or performed  in visit on 06/01/20  Intrinsic Factor Antibodies  Result Value Ref Range   Intrinsic Factor Negative Negative  Vitamin B12  Result Value Ref Range   Vitamin B-12 1,011 (H) 211 - 911 pg/mL  Vitamin B5  Result Value Ref Range   Vitamin B5 90.7 12.9 - 253.1 ng/mL   Assessment & Plan:  This visit occurred during the SARS-CoV-2 public health emergency.  Safety protocols were in place, including screening questions prior to the visit, additional usage of staff PPE, and extensive cleaning of exam room while observing appropriate contact time as indicated for disinfecting solutions.   Problem List Items Addressed This Visit     COPD (chronic obstructive pulmonary disease) ([place])    Emphysema by prior imaging, denies significant exertional dyspnea or wheezing.  For ongoing cough, consider spiriva trial as per below.        Relevant Medications   azithromycin (ZITHROMAX) 250 MG tablet   predniSONE (DELTASONE) 20 MG tablet   Ex-smoker   History of lung cancer in adulthood    S/p L lower lobe wedge resection 02/2020  Released from thoracic surgery, continues seeing oncology.        Chronic cough - Primary    Present for 3 + months, coughing fits now causing trouble with incontinence.  Overall reassuring exam - anticipate post-inflammatory cough vs allergic - rec trial of daily antihistamine + prednisone taper and if ongoing may take zpack to empirically treat possible atypical infection. If no better with this, will recommend trial of daily respiratory medication like spiriva in known COPD hx.  Reviewed latest reassuring CXR done a few weeks ago. Did not repeat given no change in cough in the last few weeks.          Meds ordered this encounter  Medications   azithromycin (ZITHROMAX) 250 MG tablet    Sig: Take two tablets on day one followed by one tablet on days 2-5    Dispense:  6 each    Refill:  0   predniSONE (DELTASONE) 20 MG tablet    Sig: Take two tablets daily for 3 days  followed by one tablet daily for 4 days    Dispense:  10 tablet    Refill:  0    No orders of the defined types were placed in this encounter.   Patient Instructions   ?allergic cough vs post- inflammatory cough  Start taking allegra or claritin over the counter cough medicine daily for at least 3 weeks.  For cough also take  plain mucinex with plenty of water.  May try another prednisone taper sent to pharmacy.  If above doesn't help, may take zpack antibiotic sent to pharmacy.  Let us know if not improving with this to consider respiratory medicine to treat COPD.   Follow up plan: Return if symptoms worsen or fail to improve.  Ria Bush, MD

## 2020-07-06 NOTE — Patient Instructions (Addendum)
?  allergic cough vs post- inflammatory cough  Start taking allegra or claritin over the counter cough medicine daily for at least 3 weeks.  For cough also take plain mucinex with plenty of water.  May try another prednisone taper sent to pharmacy.  If above doesn't help, may take zpack antibiotic sent to pharmacy.  Let us know if not improving with this to consider respiratory medicine to treat COPD.

## 2020-07-21 ENCOUNTER — Other Ambulatory Visit: Payer: Self-pay | Admitting: Family Medicine

## 2020-09-13 ENCOUNTER — Other Ambulatory Visit: Payer: Self-pay | Admitting: Family Medicine

## 2020-09-14 ENCOUNTER — Other Ambulatory Visit: Payer: Self-pay | Admitting: Family Medicine

## 2020-09-28 ENCOUNTER — Other Ambulatory Visit: Payer: Self-pay

## 2020-09-28 ENCOUNTER — Ambulatory Visit
Admission: RE | Admit: 2020-09-28 | Discharge: 2020-09-28 | Disposition: A | Discharge status: Home / Self Care | Payer: Federal, State, Local not specified - PPO | Source: Ambulatory Visit | Attending: Oncology | Admitting: Oncology

## 2020-09-28 DIAGNOSIS — Z85118 Personal history of other malignant neoplasm of bronchus and lung: Secondary | ICD-10-CM | POA: Diagnosis present

## 2020-10-01 ENCOUNTER — Telehealth: Payer: Federal, State, Local not specified - PPO | Admitting: Oncology

## 2020-10-09 ENCOUNTER — Other Ambulatory Visit: Payer: Self-pay | Admitting: Family Medicine

## 2020-10-09 DIAGNOSIS — Z1231 Encounter for screening mammogram for malignant neoplasm of breast: Secondary | ICD-10-CM

## 2020-10-19 ENCOUNTER — Ambulatory Visit: Payer: Federal, State, Local not specified - PPO | Admitting: Family Medicine

## 2020-10-23 ENCOUNTER — Encounter: Payer: Self-pay | Admitting: Family Medicine

## 2020-10-23 ENCOUNTER — Other Ambulatory Visit: Payer: Self-pay

## 2020-10-23 ENCOUNTER — Ambulatory Visit: Payer: Federal, State, Local not specified - PPO | Admitting: Family Medicine

## 2020-10-23 VITALS — BP 102/68 | HR 93 | Temp 97.6°F | Ht 64.0 in | Wt 128.8 lb

## 2020-10-23 DIAGNOSIS — R300 Dysuria: Secondary | ICD-10-CM

## 2020-10-23 DIAGNOSIS — N3000 Acute cystitis without hematuria: Secondary | ICD-10-CM | POA: Diagnosis not present

## 2020-10-23 LAB — POCT URINALYSIS DIPSTICK
Bilirubin, UA: NEGATIVE
Glucose, UA: NEGATIVE
Ketones, UA: NEGATIVE
Nitrite, UA: NEGATIVE
Protein, UA: POSITIVE — AB
Spec Grav, UA: 1.01 (ref 1.010–1.025)
Urobilinogen, UA: 0.2 E.U./dL
pH, UA: 5 (ref 5.0–8.0)

## 2020-10-23 MED ORDER — SULFAMETHOXAZOLE-TRIMETHOPRIM 800-160 MG PO TABS: 1.0000 | ORAL_TABLET | Freq: Two times a day (BID) | ORAL | 0 refills | Status: AC

## 2020-10-23 NOTE — Patient Instructions (Signed)
Urine with infection -take antibiotic - call if not better - or if fever chills worsening symptoms  Will follow-up final culture

## 2020-10-23 NOTE — Progress Notes (Signed)
Subjective:     Cassie Shaw is a 75 y.o. female presenting for Urinary Tract Infection and Dysuria     Urinary Tract Infection  This is a new problem. The current episode started yesterday. The problem occurs every urination. The quality of the pain is described as aching. There has been no fever. She is Not sexually active. There is A history of pyelonephritis. Associated symptoms include frequency, hesitancy and urgency. Pertinent negatives include no chills, flank pain, hematuria, nausea or vomiting. She has tried increased fluids (water intake) for the symptoms. The treatment provided no relief.  Dysuria  Associated symptoms include frequency, hesitancy and urgency. Pertinent negatives include no chills, flank pain, hematuria, nausea or vomiting.    Review of Systems  Constitutional:  Negative for chills.  Gastrointestinal:  Negative for nausea and vomiting.  Genitourinary:  Positive for dysuria, frequency, hesitancy and urgency. Negative for flank pain and hematuria.    Social History   Tobacco Use  Smoking Status Former   Packs/day: 1.00   Years: 46.00   Pack years: 46.00   Types: Cigarettes   Quit date: 01/21/2004   Years since quitting: 16.7  Smokeless Tobacco Never        Objective:    BP Readings from Last 3 Encounters:  10/23/20 102/68  07/06/20 122/82  06/19/20 123/83   Wt Readings from Last 3 Encounters:  10/23/20 128 lb 12.8 oz (58.4 kg)  07/06/20 128 lb 6 oz (58.2 kg)  06/19/20 129 lb 6.4 oz (58.7 kg)    BP 102/68 (BP Location: Left Arm, Patient Position: Sitting, Cuff Size: Normal)   Pulse 93   Temp 97.6 F (36.4 C) (Oral)   Ht 5\' 4"  (1.626 m)   Wt 128 lb 12.8 oz (58.4 kg)   SpO2 97%   BMI 22.11 kg/m    Physical Exam Constitutional:      General: She is not in acute distress.    Appearance: She is well-developed. She is not diaphoretic.  HENT:     Head: Normocephalic and atraumatic.  Eyes:     Conjunctiva/sclera: Conjunctivae  normal.  Cardiovascular:     Rate and Rhythm: Normal rate and regular rhythm.     Heart sounds: Normal heart sounds.  Pulmonary:     Effort: Pulmonary effort is normal.  Abdominal:     General: Bowel sounds are normal. There is no distension.     Palpations: Abdomen is soft.     Tenderness: There is no abdominal tenderness. There is no right CVA tenderness, left CVA tenderness or guarding.  Musculoskeletal:     Cervical back: Neck supple.  Skin:    General: Skin is warm and dry.  Neurological:     Mental Status: She is alert.    UA: +LE, neg nitrites  Prior Ucx: klebsiella with response to bactrim      Assessment & Plan:   Problem List Items Addressed This Visit   None Visit Diagnoses     Acute cystitis without hematuria    -  Primary   Relevant Medications   sulfamethoxazole-trimethoprim (BACTRIM DS) 800-160 MG tablet   Dysuria       Relevant Orders   Urine Culture   Urinalysis Dipstick (Completed)      Reviewed UA and prior culture. Will send for culture Start bactrim based on prior response.    Return if symptoms worsen or fail to improve.  Lesleigh Noe, MD  This visit occurred during the SARS-CoV-2 public health  emergency.  Safety protocols were in place, including screening questions prior to the visit, additional usage of staff PPE, and extensive cleaning of exam room while observing appropriate contact time as indicated for disinfecting solutions.

## 2020-10-25 ENCOUNTER — Other Ambulatory Visit: Payer: Self-pay

## 2020-10-25 ENCOUNTER — Ambulatory Visit
Admission: RE | Admit: 2020-10-25 | Discharge: 2020-10-25 | Disposition: A | Discharge status: Home / Self Care | Payer: Federal, State, Local not specified - PPO | Source: Ambulatory Visit | Attending: Family Medicine | Admitting: Family Medicine

## 2020-10-25 DIAGNOSIS — Z1231 Encounter for screening mammogram for malignant neoplasm of breast: Secondary | ICD-10-CM | POA: Insufficient documentation

## 2020-10-26 LAB — URINE CULTURE
MICRO NUMBER:: 12458284
SPECIMEN QUALITY:: ADEQUATE

## 2021-04-05 ENCOUNTER — Other Ambulatory Visit: Payer: Self-pay | Admitting: Family Medicine

## 2021-04-05 NOTE — Telephone Encounter (Signed)
Gabapentin ?Last filled:  12/11/20, #180 ?Last OV:  07/06/20, COPD ?Next OV:  none ?

## 2021-04-29 ENCOUNTER — Encounter: Payer: Self-pay | Admitting: Nurse Practitioner

## 2021-04-29 ENCOUNTER — Inpatient Hospital Stay: Payer: Federal, State, Local not specified - PPO | Attending: Nurse Practitioner | Admitting: Nurse Practitioner

## 2021-04-29 VITALS — BP 132/85 | HR 91 | Temp 98.7°F | Resp 20 | Wt 131.4 lb

## 2021-04-29 DIAGNOSIS — Z87891 Personal history of nicotine dependence: Secondary | ICD-10-CM | POA: Insufficient documentation

## 2021-04-29 DIAGNOSIS — C3492 Malignant neoplasm of unspecified part of left bronchus or lung: Secondary | ICD-10-CM

## 2021-04-29 DIAGNOSIS — Z85118 Personal history of other malignant neoplasm of bronchus and lung: Secondary | ICD-10-CM | POA: Insufficient documentation

## 2021-04-29 NOTE — Progress Notes (Signed)
?Hematology/Oncology Consult Note ?[place] ?[phone] [phone] ? ?Patient Care Team: ?Ria Bush, MD as PCP - General (Family Medicine) ?Telford Nab, RN as Sales executive  ? ?Name of the patient: Cassie Shaw  ?[id number]  ?09/03/1945  ? ?Reason for referral-lung cancer s/p wedge resection ?  ?Referring physician-Dr. Roxan Hockey ? ?Date of visit: 04/29/21 ? ?History of presenting illness- Patient is a 76 year old female with history of irritable bowel syndrome, peripheral neuropathy, tobacco abuse among other medical problems.  She underwent a low-dose screening CT chest on October 2021 which showed a lung nodule in the left lower lobe 11.3 mm with a solid component of 6.6 mm.  There was improvement in the size of this nodule 3 months later.  A PET CT scan showed mild metabolic activity within the cavitary lesion in the left lower lobe.  No evidence of mediastinal or hilar adenopathy or distant metastatic disease.  Patient was seen by Dr. Roxan Hockey and underwent left lower lobe wedge resection on 03/15/2020. ? 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  No evidence of lymphovascular or visceral pleural invasion.  10 lymph nodes were negative for malignancy.  p T1b pN0 ? ?She is here to discuss further management.  Presently patient reports feeling very nauseous.  She states that she has been having on and off symptoms of nausea over the last 2 years.  She is not able to elicit it much history in this regard as she is having nausea at this time.  Reports occasional pain at the site of wedge resection but denies other complaints ? ?Interval History: Patient is 76 year old female with above history of early-stage lung cancer status post resection who returns to clinic for routine surveillance.  She complains of dry mouth especially at night and ongoing dry cough.  Has been using a vibration table/plate for exercise.  No  unintentional weight loss, hemoptysis.  No chest pain. ? ?ECOG PS- 1 ? ?Pain scale- 0 ? ? ?Review of systems- Review of Systems  ?Constitutional:  Negative for chills, fever, malaise/fatigue and weight loss.  ?HENT:  Negative for congestion, ear discharge and nosebleeds.   ?Eyes:  Negative for blurred vision.  ?Respiratory:  Positive for cough. Negative for hemoptysis, sputum production, shortness of breath and wheezing.   ?Cardiovascular:  Negative for chest pain, palpitations, orthopnea and claudication.  ?Gastrointestinal:  Negative for abdominal pain, blood in stool, constipation, diarrhea, heartburn, melena, nausea and vomiting.  ?Genitourinary:  Negative for dysuria, flank pain, frequency, hematuria and urgency.  ?Musculoskeletal:  Negative for back pain, joint pain and myalgias.  ?Skin:  Negative for rash.  ?Neurological:  Negative for dizziness, tingling, focal weakness, seizures, weakness and headaches.  ?Endo/Heme/Allergies:  Does not bruise/bleed easily.  ?Psychiatric/Behavioral:  Negative for depression and suicidal ideas. The patient does not have insomnia.   ? ?No Known Allergies ? ?Patient Active Problem List  ? Diagnosis Date Noted  ? Chronic cough 07/06/2020  ? Low serum alkaline phosphatase 05/31/2020  ? Chronic constipation 04/18/2020  ? History of lung cancer in adulthood 03/20/2020  ? S/P robot-assisted surgical procedure 03/15/2020  ? Ex-smoker 06/28/2017  ? Right-sided chest wall pain 06/28/2017  ? Advanced care planning/counseling discussion 06/28/2017  ? Abnormal TSH 06/28/2017  ? COPD (chronic obstructive pulmonary disease) ([place]) 06/26/2017  ? Thoracic aortic atherosclerosis ([place]) 06/26/2017  ? Health maintenance examination 06/25/2017  ? Vertigo 03/13/2016  ? Chronic nausea 09/07/2015  ? Osteoporosis   ? Fibromyalgia   ?  Fibrocystic breast disease   ? Vitamin B12 deficiency   ? Burning sensation of foot 11/14/2014  ? IBS (irritable bowel syndrome)   ? Dyslipidemia   ? Lower back pain    ? ? ? ?Past Medical History:  ?Diagnosis Date  ? Arthritis   ? B12 deficiency   ? COPD (chronic obstructive pulmonary disease) ([place])   ? Ex-smoker   ? Fibrocystic breast disease   ? Fibromyalgia   ? History of chicken pox   ? History of depression   ? and anxiety  ? HLD (hyperlipidemia)   ? IBS (irritable bowel syndrome)   ? Osteoporosis 2015  ? on/off fosamax DEXA T -3.5 spine, -2.5 hip  ? Peripheral neuropathy   ? Retinal detachment   ? ? ? ?Past Surgical History:  ?Procedure Laterality Date  ? CATARACT EXTRACTION Bilateral 2014  ? with lens implant  ? COLONOSCOPY  12/2005  ? WNL Collene Mares)  ? COLONOSCOPY  08/2017  ? 5 TAs, rpt 5 yrs Collene Mares)  ? DEXA  01/2013  ? T -3.5 spine, -2.5 hip  ? INTERCOSTAL NERVE BLOCK Left 03/15/2020  ? Procedure: INTERCOSTAL NERVE BLOCK;  Surgeon: Melrose Nakayama, MD;  Location: [facility];  Service: Thoracic;  Laterality: Left;  ? LYMPH NODE DISSECTION Bilateral 03/15/2020  ? Procedure: LYMPH NODE DISSECTION;  Surgeon: Melrose Nakayama, MD;  Location: [facility];  Service: Thoracic;  Laterality: Bilateral;  ? PARTIAL HYSTERECTOMY  1975  ? for heavy bleeding, ovaries remained  ? RETINAL DETACHMENT SURGERY  2014  ? Dr Zadie Rhine  ? VIDEO ASSISTED THORACOSCOPY (VATS)/WEDGE RESECTION Left 02/2020  ? Robotic left video-assisted thoracoscopy, L lower lobe wedge resection, L lower lobectomy and LN dissection Roxan Hockey)  ? ? ?Social History  ? ?Socioeconomic History  ? Marital status: Married  ?  Spouse name: Not on file  ? Number of children: Not on file  ? Years of education: Not on file  ? Highest education level: Not on file  ?Occupational History  ? Not on file  ?Tobacco Use  ? Smoking status: Former  ?  Packs/day: 1.00  ?  Years: 46.00  ?  Pack years: 46.00  ?  Types: Cigarettes  ?  Quit date: 01/21/2004  ?  Years since quitting: 17.2  ? Smokeless tobacco: Never  ?Vaping Use  ? Vaping Use: Never used  ?Substance and Sexual Activity  ? Alcohol use: No  ?  Alcohol/week: 0.0 standard drinks  ?  Drug use: No  ? Sexual activity: Not on file  ?Other Topics Concern  ? Not on file  ?Social History Narrative  ? Lives with husband [name]), 2 dogs and a bird  ? Occupation: retired, was Armed forces logistics/support/administrative officer  ? Activity: no regular exercise  ? Diet: good water, fruits/vegetables daily  ? ?Social Determinants of Health  ? ?Financial Resource Strain: Not on file  ?Food Insecurity: Not on file  ?Transportation Needs: Not on file  ?Physical Activity: Not on file  ?Stress: Not on file  ?Social Connections: Not on file  ?Intimate Partner Violence: Not on file  ? ?  ?Family History  ?Problem Relation Age of Onset  ? Arthritis Mother   ? Cancer Mother   ?     smoker  ? Cancer Sister   ?     bone  ? Diabetes Maternal Uncle   ? Cancer Maternal Grandfather   ? Stroke Neg Hx   ? CAD Neg Hx   ? Breast cancer Neg Hx   ? ? ? ?  Current Outpatient Medications:  ?  acetaminophen (TYLENOL) 325 MG tablet, Take 650 mg by mouth every 6 (six) hours as needed., Disp: , Rfl:  ?  alendronate (FOSAMAX) 70 MG tablet, TAKE 1 TABLET BY MOUTH ONE TIME PER WEEK, Disp: 12 tablet, Rfl: 3 ?  Cholecalciferol (VITAMIN D3) 1000 units CAPS, Take 1 capsule (1,000 Units total) by mouth daily., Disp: 30 capsule, Rfl:  ?  gabapentin (NEURONTIN) 600 MG tablet, TAKE 1 TABLET BY MOUTH TWICE A DAY, Disp: 180 tablet, Rfl: 1 ?  ibuprofen (ADVIL) 200 MG tablet, Take 200 mg by mouth 2 (two) times daily as needed for moderate pain., Disp: , Rfl:  ?  lovastatin (MEVACOR) 40 MG tablet, TAKE 1 TABLET BY MOUTH EVERYDAY AT BEDTIME, Disp: 90 tablet, Rfl: 2 ?  melatonin 1 MG TABS tablet, Take 3 mg by mouth at bedtime., Disp: , Rfl:  ?  Multiple Vitamin (MULTIVITAMIN) tablet, Take 1 tablet by mouth daily., Disp: , Rfl:  ?  nortriptyline (PAMELOR) 25 MG capsule, TAKE 1 CAPSULE BY MOUTH AT BEDTIME., Disp: 90 capsule, Rfl: 3 ?  azithromycin (ZITHROMAX) 250 MG tablet, Take two tablets on day one followed by one tablet on days 2-5 (Patient not taking: Reported on 10/23/2020), Disp: 6 each,  Rfl: 0 ?  predniSONE (DELTASONE) 20 MG tablet, Take two tablets daily for 3 days followed by one tablet daily for 4 days (Patient not taking: Reported on 10/23/2020), Disp: 10 tablet, Rfl: 0 ? ? ?Physical e

## 2021-05-06 ENCOUNTER — Ambulatory Visit
Admission: RE | Admit: 2021-05-06 | Discharge: 2021-05-06 | Disposition: A | Discharge status: Home / Self Care | Payer: Federal, State, Local not specified - PPO | Source: Ambulatory Visit | Attending: Nurse Practitioner | Admitting: Nurse Practitioner

## 2021-05-06 ENCOUNTER — Other Ambulatory Visit: Payer: Self-pay

## 2021-05-06 DIAGNOSIS — C3492 Malignant neoplasm of unspecified part of left bronchus or lung: Secondary | ICD-10-CM | POA: Diagnosis present

## 2021-05-07 ENCOUNTER — Telehealth: Payer: Self-pay

## 2021-05-07 NOTE — Telephone Encounter (Signed)
-----   Message from Verlon Au, NP sent at 05/07/2021 12:14 PM EDT ----- ?Please let patient know that ct looks stable. Follow up as planned.  ?Lauren ?----- Message ----- ?From: Interface, Rad Results In ?Sent: 05/07/2021  11:35 AM EDT ?To: Verlon Au, NP ? ? ?

## 2021-07-02 ENCOUNTER — Other Ambulatory Visit: Payer: Self-pay | Admitting: Family Medicine

## 2021-07-02 NOTE — Telephone Encounter (Signed)
Refill request [facility] Last refill 06/21/20 #90/3 Last office visit 10/23/20

## 2021-07-04 NOTE — Telephone Encounter (Signed)
ERx 

## 2021-07-09 ENCOUNTER — Encounter: Payer: Self-pay | Admitting: Nurse Practitioner

## 2021-07-09 ENCOUNTER — Ambulatory Visit: Payer: Federal, State, Local not specified - PPO | Admitting: Nurse Practitioner

## 2021-07-09 VITALS — BP 104/78 | HR 101 | Temp 97.1°F | Resp 14 | Ht 64.0 in | Wt 131.2 lb

## 2021-07-09 DIAGNOSIS — R0982 Postnasal drip: Secondary | ICD-10-CM

## 2021-07-09 DIAGNOSIS — R052 Subacute cough: Secondary | ICD-10-CM | POA: Diagnosis not present

## 2021-07-09 DIAGNOSIS — J0100 Acute maxillary sinusitis, unspecified: Secondary | ICD-10-CM

## 2021-07-09 MED ORDER — DOXYCYCLINE HYCLATE 100 MG PO TABS: 100.0000 mg | ORAL_TABLET | Freq: Two times a day (BID) | ORAL | 0 refills | Status: AC

## 2021-07-09 MED ORDER — BENZONATATE 100 MG PO CAPS: 100.0000 mg | ORAL_CAPSULE | Freq: Three times a day (TID) | ORAL | 0 refills | Status: DC | PRN

## 2021-07-09 MED ORDER — FLUTICASONE PROPIONATE 50 MCG/ACT NA SUSP: 2.0000 | Freq: Every day | NASAL | 0 refills | Status: DC

## 2021-07-09 NOTE — Progress Notes (Signed)
Acute Office Visit  Subjective:     Patient ID: Cassie Shaw, female    DOB: 03-23-1945, 76 y.o.   MRN: [id number]  Chief Complaint  Patient presents with   Sore Throat    Sx started about 2 months ago. Post nasal drip making her throat sore, cough- slight productive cough, headache, sinus pressure, body aches. Has been taking OTC sinus medications and cough syrup.     Patient is in today for Sore throat  Symptoms started approx 2 months ago per patient report No sick contacts Pfizer vaccines UTD Flu vaccine up to date She has been taking sinus pills that were behind the counter, antihistamine muscus pills, cough syrup.   Review of Systems  Constitutional:  Positive for malaise/fatigue. Negative for chills and fever.       Decreased appetites   HENT:  Positive for congestion, ear pain (full), sinus pain and sore throat (dry and scratchy). Negative for ear discharge.        PND "+"  Respiratory:  Positive for cough (mainly dry) and shortness of breath (at rest).   Musculoskeletal:  Positive for myalgias.  Neurological:  Positive for headaches.        Objective:    BP 104/78   Pulse (!) 101   Temp (!) 97.1 F (36.2 C)   Resp 14   Ht 5\' 4"  (1.626 m)   Wt 131 lb 4 oz (59.5 kg)   SpO2 99%   BMI 22.53 kg/m    Physical Exam Vitals and nursing note reviewed.  Constitutional:      Appearance: She is well-developed.  HENT:     Right Ear: Tympanic membrane and ear canal normal.     Left Ear: Ear canal normal. A middle ear effusion is present.     Nose:     Right Sinus: Maxillary sinus tenderness present. No frontal sinus tenderness.     Left Sinus: Maxillary sinus tenderness present. No frontal sinus tenderness.     Mouth/Throat:     Mouth: Mucous membranes are moist.     Comments: Apparent PND Neck:     Thyroid: No thyromegaly.  Cardiovascular:     Rate and Rhythm: Normal rate and regular rhythm.     Heart sounds: Normal heart sounds.  Pulmonary:     Effort:  Pulmonary effort is normal.     Breath sounds: Normal breath sounds.  Lymphadenopathy:     Cervical: Cervical adenopathy present.  Neurological:     Mental Status: She is alert.     No results found for any visits on 07/09/21.      Assessment & Plan:   Problem List Items Addressed This Visit       Respiratory   Acute non-recurrent maxillary sinusitis - Primary    Given length of symptoms and patient presentation will like to treat with doxycycline 100 mg twice daily for 7 days.  Patient will follow-up if no improvement      Relevant Medications   doxycycline (VIBRA-TABS) 100 MG tablet   fluticasone (FLONASE) 50 MCG/ACT nasal spray   benzonatate (TESSALON PERLES) 100 MG capsule     Other   Subacute cough    Well send in Tessalon Perles 100 mg 3 times daily as needed cough      Relevant Medications   benzonatate (TESSALON PERLES) 100 MG capsule   PND (post-nasal drip)    Apparent on physical exam.  We will start Flonase 2 sprays each nostril daily.  Relevant Medications   fluticasone (FLONASE) 50 MCG/ACT nasal spray    Meds ordered this encounter  Medications   doxycycline (VIBRA-TABS) 100 MG tablet    Sig: Take 1 tablet (100 mg total) by mouth 2 (two) times daily for 7 days.    Dispense:  14 tablet    Refill:  0    Order Specific Question:   Supervising Provider    Answer:   Glori Bickers MARNE A [[id number]]   fluticasone (FLONASE) 50 MCG/ACT nasal spray    Sig: Place 2 sprays into both nostrils daily.    Dispense:  16 g    Refill:  0    Order Specific Question:   Supervising Provider    Answer:   Glori Bickers MARNE A [[id number]]   benzonatate (TESSALON PERLES) 100 MG capsule    Sig: Take 1 capsule (100 mg total) by mouth 3 (three) times daily as needed for cough.    Dispense:  20 capsule    Refill:  0    Order Specific Question:   Supervising Provider    Answer:   Loura Pardon A [[id number]]    Return in about 6 weeks (around 08/20/2021) for CPE with Dr. Danise Mina.  Romilda Garret, NP

## 2021-07-09 NOTE — Assessment & Plan Note (Signed)
Apparent on physical exam.  We will start Flonase 2 sprays each nostril daily.

## 2021-07-09 NOTE — Patient Instructions (Signed)
Nice to see you today I sent in 3 medications to your pharmacy Let us know if you do not improve after medications Follow up with Dr. Danise Mina within a month of two for your physical

## 2021-07-09 NOTE — Assessment & Plan Note (Signed)
Well send in Tessalon Perles 100 mg 3 times daily as needed cough

## 2021-07-09 NOTE — Assessment & Plan Note (Signed)
Given length of symptoms and patient presentation will like to treat with doxycycline 100 mg twice daily for 7 days.  Patient will follow-up if no improvement

## 2021-07-31 ENCOUNTER — Other Ambulatory Visit: Payer: Self-pay | Admitting: Nurse Practitioner

## 2021-07-31 DIAGNOSIS — R0982 Postnasal drip: Secondary | ICD-10-CM

## 2021-08-04 ENCOUNTER — Other Ambulatory Visit: Payer: Self-pay | Admitting: Family Medicine

## 2021-08-15 ENCOUNTER — Encounter: Payer: Self-pay | Admitting: Family Medicine

## 2021-08-15 ENCOUNTER — Ambulatory Visit: Payer: Federal, State, Local not specified - PPO | Admitting: Family Medicine

## 2021-08-15 VITALS — BP 110/74 | HR 91 | Temp 98.0°F | Ht 64.0 in | Wt 131.4 lb

## 2021-08-15 DIAGNOSIS — R300 Dysuria: Secondary | ICD-10-CM

## 2021-08-15 DIAGNOSIS — R0982 Postnasal drip: Secondary | ICD-10-CM

## 2021-08-15 DIAGNOSIS — R350 Frequency of micturition: Secondary | ICD-10-CM | POA: Diagnosis not present

## 2021-08-15 LAB — POC URINALSYSI DIPSTICK (AUTOMATED)
Bilirubin, UA: NEGATIVE
Blood, UA: NEGATIVE
Glucose, UA: NEGATIVE
Ketones, UA: NEGATIVE
Nitrite, UA: NEGATIVE
Protein, UA: NEGATIVE
Spec Grav, UA: 1.015 (ref 1.010–1.025)
Urobilinogen, UA: 0.2 E.U./dL
pH, UA: 5.5 (ref 5.0–8.0)

## 2021-08-15 MED ORDER — SULFAMETHOXAZOLE-TRIMETHOPRIM 800-160 MG PO TABS: 1.0000 | ORAL_TABLET | Freq: Two times a day (BID) | ORAL | 0 refills | Status: DC

## 2021-08-15 MED ORDER — IPRATROPIUM BROMIDE 0.06 % NA SOLN: 2.0000 | Freq: Four times a day (QID) | NASAL | 1 refills | Status: DC

## 2021-08-15 NOTE — Progress Notes (Signed)
Cassie Verrette T. Mateja Dier, MD, [place] at [facility] [place], [address]  Phone: [phone]  FAX: [phone]  Cassie Shaw - 76 y.o. female  MRN [id number]  Date of Birth: 1945-02-01  Date: 08/15/2021  PCP: Ria Bush, MD  Referral: Ria Bush, MD  Chief Complaint  Patient presents with   Dysuria   Urinary Frequency   Sinus Drainage   Subjective:   Cassie Shaw is a 76 y.o. very pleasant female patient with Body mass index is 22.55 kg/m. who presents with the following:  She presents with a primary complaint of urgency and dysuria.  She feels as if she has to go the bathroom, and this can be prior prolific, but when she goes she is able to only minimally producing urine.  Is been present for roughly 1 week.  She has been drinking a lot of cranberry juice and taking some cranberry juice concentrate, however none of this is really relieved her symptoms.  She denies any fevers, chills, systemic symptoms.  She has no back pain.  She does not have any significant abdominal pain.  She also has a history of having runny nose and postnasal drainage going back greater than a year.  She was here in the office 6 weeks ago, Mr. Charmian Muff gave her some Tessalon Perles along with some Flonase, these do believe her symptoms somewhat.  They did come back.  Having some dysuria, has been taking cranberry pills.  Sinus drainage and mucous with coughing.   Has tried some mucinex, nose spray.    Review of Systems is noted in the HPI, as appropriate  Objective:   BP 110/74   Pulse 91   Temp 98 F (36.7 C) (Oral)   Ht 5\' 4"  (1.626 m)   Wt 131 lb 6 oz (59.6 kg)   SpO2 98%   BMI 22.55 kg/m   Gen: WDWN, NAD. Globally Non-toxic HEENT: Throat clear, w/o exudate, R TM serous fluid, L TM - good landmarks,+ fluid present. rhinnorhea.  MMM Frontal sinuses: NT Max sinuses: NT NECK: Anterior cervical  LAD is absent CV: RRR,  No M/G/R, cap refill <2 sec PULM: Breathing comfortably in no respiratory distress. no wheezing, crackles, rhonchi  ABD: S, NT, ND, + BS, No rebound, No HSM  No CVAT  Laboratory and Imaging Data: Results for orders placed or performed in visit on 08/15/21  POCT Urinalysis Dipstick (Automated)  Result Value Ref Range   Color, UA Yellow    Clarity, UA Hazy    Glucose, UA Negative Negative   Bilirubin, UA Negative    Ketones, UA Negative    Spec Grav, UA 1.015 1.010 - 1.025   Blood, UA Negative    pH, UA 5.5 5.0 - 8.0   Protein, UA Negative Negative   Urobilinogen, UA 0.2 0.2 or 1.0 E.U./dL   Nitrite, UA Negative    Leukocytes, UA Small (1+) (A) Negative     Assessment and Plan:     ICD-10-CM   1. Urinary frequency  R35.0 POCT Urinalysis Dipstick (Automated)    Urine Culture    2. Dysuria  R30.0     3. PND (post-nasal drip)  R09.82      Presumed cystitis, but we will also obtain a urine culture.  We will treat with sulfa antibiotics now.  Chronic postnasal drip, allergy symptoms.  She does take some antihistamines, she has used Flonase in the past.  It does  not look as if she is she is on Atrovent, so hopefully this will provide her some more relief.  Medication Management during today's office visit: Meds ordered this encounter  Medications   sulfamethoxazole-trimethoprim (BACTRIM DS) 800-160 MG tablet    Sig: Take 1 tablet by mouth 2 (two) times daily.    Dispense:  14 tablet    Refill:  0   ipratropium (ATROVENT) 0.06 % nasal spray    Sig: Place 2 sprays into both nostrils 4 (four) times daily.    Dispense:  15 mL    Refill:  1   Medications Discontinued During This Encounter  Medication Reason   benzonatate (TESSALON PERLES) 100 MG capsule Completed Course   fluticasone (FLONASE) 50 MCG/ACT nasal spray Completed Course    Orders placed today for conditions managed today: Orders Placed This Encounter  Procedures   Urine Culture   POCT Urinalysis Dipstick  (Automated)    Follow-up if needed: No follow-ups on file.  Dr[employer] One speech-to-text software was used for transcription in this dictation.  Possible transcriptional errors can occur using Editor, commissioning.   Signed,  Maud Deed. Chevette Fee, MD   Outpatient Encounter Medications as of 08/15/2021  Medication Sig   acetaminophen (TYLENOL) 325 MG tablet Take 650 mg by mouth every 6 (six) hours as needed.   alendronate (FOSAMAX) 70 MG tablet TAKE 1 TABLET BY MOUTH ONE TIME PER WEEK   Cholecalciferol (VITAMIN D3) 1000 units CAPS Take 1 capsule (1,000 Units total) by mouth daily.   gabapentin (NEURONTIN) 600 MG tablet TAKE 1 TABLET BY MOUTH TWICE A DAY   ibuprofen (ADVIL) 200 MG tablet Take 200 mg by mouth 2 (two) times daily as needed for moderate pain.   ipratropium (ATROVENT) 0.06 % nasal spray Place 2 sprays into both nostrils 4 (four) times daily.   lovastatin (MEVACOR) 40 MG tablet TAKE 1 TABLET BY MOUTH EVERYDAY AT BEDTIME   melatonin 1 MG TABS tablet Take 3 mg by mouth at bedtime.   Multiple Vitamin (MULTIVITAMIN) tablet Take 1 tablet by mouth daily.   nortriptyline (PAMELOR) 25 MG capsule TAKE 1 CAPSULE BY MOUTH EVERYDAY AT BEDTIME   sulfamethoxazole-trimethoprim (BACTRIM DS) 800-160 MG tablet Take 1 tablet by mouth 2 (two) times daily.   [DISCONTINUED] benzonatate (TESSALON PERLES) 100 MG capsule Take 1 capsule (100 mg total) by mouth 3 (three) times daily as needed for cough.   [DISCONTINUED] fluticasone (FLONASE) 50 MCG/ACT nasal spray Place 2 sprays into both nostrils daily.   No facility-administered encounter medications on file as of 08/15/2021.

## 2021-08-17 LAB — URINE CULTURE
MICRO NUMBER:: 13702969
SPECIMEN QUALITY:: ADEQUATE

## 2021-10-05 ENCOUNTER — Other Ambulatory Visit: Payer: Self-pay | Admitting: Family Medicine

## 2021-10-05 DIAGNOSIS — M810 Age-related osteoporosis without current pathological fracture: Secondary | ICD-10-CM

## 2021-10-05 DIAGNOSIS — E785 Hyperlipidemia, unspecified: Secondary | ICD-10-CM

## 2021-10-07 NOTE — Telephone Encounter (Signed)
Gabapentin Last filled:  07/04/21, #180 Last OV:  07/06/20, chronic cough Next OV:  10/29/21, CPE

## 2021-10-09 NOTE — Telephone Encounter (Signed)
ERx 

## 2021-10-18 ENCOUNTER — Other Ambulatory Visit: Payer: Self-pay | Admitting: Family Medicine

## 2021-10-18 DIAGNOSIS — Z1231 Encounter for screening mammogram for malignant neoplasm of breast: Secondary | ICD-10-CM

## 2021-10-20 ENCOUNTER — Other Ambulatory Visit: Payer: Self-pay | Admitting: Family Medicine

## 2021-10-20 DIAGNOSIS — R7989 Other specified abnormal findings of blood chemistry: Secondary | ICD-10-CM

## 2021-10-20 DIAGNOSIS — E785 Hyperlipidemia, unspecified: Secondary | ICD-10-CM

## 2021-10-20 DIAGNOSIS — M810 Age-related osteoporosis without current pathological fracture: Secondary | ICD-10-CM

## 2021-10-20 DIAGNOSIS — E538 Deficiency of other specified B group vitamins: Secondary | ICD-10-CM

## 2021-10-22 ENCOUNTER — Other Ambulatory Visit (INDEPENDENT_AMBULATORY_CARE_PROVIDER_SITE_OTHER): Payer: Federal, State, Local not specified - PPO

## 2021-10-22 DIAGNOSIS — E538 Deficiency of other specified B group vitamins: Secondary | ICD-10-CM | POA: Diagnosis not present

## 2021-10-22 DIAGNOSIS — R7989 Other specified abnormal findings of blood chemistry: Secondary | ICD-10-CM | POA: Diagnosis not present

## 2021-10-22 DIAGNOSIS — M810 Age-related osteoporosis without current pathological fracture: Secondary | ICD-10-CM | POA: Diagnosis not present

## 2021-10-22 DIAGNOSIS — E785 Hyperlipidemia, unspecified: Secondary | ICD-10-CM | POA: Diagnosis not present

## 2021-10-22 LAB — VITAMIN D 25 HYDROXY (VIT D DEFICIENCY, FRACTURES): VITD: 48.95 ng/mL (ref 30.00–100.00)

## 2021-10-22 LAB — LIPID PANEL
Cholesterol: 187 mg/dL (ref 0–200)
HDL: 65.2 mg/dL (ref >39.00)
LDL Cholesterol: 84 mg/dL (ref 0–99)
NonHDL: 122.28
Total CHOL/HDL Ratio: 3
Triglycerides: 192 mg/dL — ABNORMAL HIGH (ref 0.0–149.0)
VLDL: 38.4 mg/dL (ref 0.0–40.0)

## 2021-10-22 LAB — COMPREHENSIVE METABOLIC PANEL
ALT: 15 U/L (ref 0–35)
AST: 22 U/L (ref 0–37)
Albumin: 4.4 g/dL (ref 3.5–5.2)
Alkaline Phosphatase: 32 U/L — ABNORMAL LOW (ref 39–117)
BUN: 13 mg/dL (ref 6–23)
CO2: 29 mEq/L (ref 19–32)
Calcium: 9.5 mg/dL (ref 8.4–10.5)
Chloride: 101 mEq/L (ref 96–112)
Creatinine, Ser: 0.84 mg/dL (ref 0.40–1.20)
GFR: 67.07 mL/min (ref >60.00)
Glucose, Bld: 97 mg/dL (ref 70–99)
Potassium: 4.8 mEq/L (ref 3.5–5.1)
Sodium: 140 mEq/L (ref 135–145)
Total Bilirubin: 0.6 mg/dL (ref 0.2–1.2)
Total Protein: 7.7 g/dL (ref 6.0–8.3)

## 2021-10-22 LAB — TSH: TSH: 2.07 u[IU]/mL (ref 0.35–5.50)

## 2021-10-22 LAB — VITAMIN B12: Vitamin B-12: >1500 pg/mL — ABNORMAL HIGH (ref 211–911)

## 2021-10-29 ENCOUNTER — Ambulatory Visit (INDEPENDENT_AMBULATORY_CARE_PROVIDER_SITE_OTHER): Payer: Federal, State, Local not specified - PPO | Admitting: Family Medicine

## 2021-10-29 ENCOUNTER — Encounter: Payer: Self-pay | Admitting: Family Medicine

## 2021-10-29 VITALS — BP 116/82 | HR 74 | Temp 97.3°F | Ht 63.0 in | Wt 128.0 lb

## 2021-10-29 DIAGNOSIS — E785 Hyperlipidemia, unspecified: Secondary | ICD-10-CM

## 2021-10-29 DIAGNOSIS — Z7189 Other specified counseling: Secondary | ICD-10-CM | POA: Diagnosis not present

## 2021-10-29 DIAGNOSIS — Z85118 Personal history of other malignant neoplasm of bronchus and lung: Secondary | ICD-10-CM

## 2021-10-29 DIAGNOSIS — I700 Atherosclerosis of aorta: Secondary | ICD-10-CM

## 2021-10-29 DIAGNOSIS — K5909 Other constipation: Secondary | ICD-10-CM

## 2021-10-29 DIAGNOSIS — M810 Age-related osteoporosis without current pathological fracture: Secondary | ICD-10-CM | POA: Diagnosis not present

## 2021-10-29 DIAGNOSIS — J449 Chronic obstructive pulmonary disease, unspecified: Secondary | ICD-10-CM

## 2021-10-29 DIAGNOSIS — Z0000 Encounter for general adult medical examination without abnormal findings: Secondary | ICD-10-CM

## 2021-10-29 DIAGNOSIS — R7989 Other specified abnormal findings of blood chemistry: Secondary | ICD-10-CM

## 2021-10-29 DIAGNOSIS — E538 Deficiency of other specified B group vitamins: Secondary | ICD-10-CM

## 2021-10-29 DIAGNOSIS — R208 Other disturbances of skin sensation: Secondary | ICD-10-CM

## 2021-10-29 MED ORDER — ALENDRONATE SODIUM 70 MG PO TABS: ORAL_TABLET | ORAL | 3 refills | Status: DC

## 2021-10-29 MED ORDER — LOVASTATIN 40 MG PO TABS: 40.0000 mg | ORAL_TABLET | Freq: Every day | ORAL | 3 refills | Status: DC

## 2021-10-29 MED ORDER — GABAPENTIN 600 MG PO TABS: 600.0000 mg | ORAL_TABLET | Freq: Two times a day (BID) | ORAL | 3 refills | Status: DC

## 2021-10-29 MED ORDER — NORTRIPTYLINE HCL 25 MG PO CAPS: 25.0000 mg | ORAL_CAPSULE | Freq: Every day | ORAL | 3 refills | Status: DC

## 2021-10-29 NOTE — Assessment & Plan Note (Signed)
Levels remain high - advised stop B12 replacement.

## 2021-10-29 NOTE — Assessment & Plan Note (Signed)
Chronic, stable on lovastatin 40mg  - continue this. The 10-year ASCVD risk score (Arnett DK, et al., 2019) is: 16.8%   Values used to calculate the score:     Age: 76 years     Sex: Female     Is Non-Hispanic African American: No     Diabetic: No     Tobacco smoker: No     Systolic Blood Pressure: 914 mmHg     Is BP treated: No     HDL Cholesterol: 65.2 mg/dL     Total Cholesterol: 187 mg/dL

## 2021-10-29 NOTE — Assessment & Plan Note (Addendum)
Prior DEXA done through endo.  # provided to call and schedule rpt DEXA at [facility] as due.  On fosamax, unsure when it was started.  prolia was unaffordable.

## 2021-10-29 NOTE — Assessment & Plan Note (Addendum)
Linzess did not help.  Continue miralax and stool softener.

## 2021-10-29 NOTE — Progress Notes (Signed)
Vision Screening   Right eye Left eye Both eyes  Without correction 20/25 20/25 20/25   With correction     Hearing Screening - Comments:: Passed whisper test. Audiometry was unavailable

## 2021-10-29 NOTE — Assessment & Plan Note (Signed)
Recent TSH normal.

## 2021-10-29 NOTE — Assessment & Plan Note (Signed)
Appreciate oncology care, seeing q6 mo.

## 2021-10-29 NOTE — Assessment & Plan Note (Signed)
Preventative protocols reviewed and updated unless pt declined. Discussed healthy diet and lifestyle.  

## 2021-10-29 NOTE — Assessment & Plan Note (Addendum)
Stable period, overall asxs off respiratory medication.

## 2021-10-29 NOTE — Assessment & Plan Note (Signed)
Chronic, continue gabapentin. Trial off nortriptyline to see if dry mouth improves.

## 2021-10-29 NOTE — Assessment & Plan Note (Addendum)
Does not have this at home. Asked to complete and bring [place] a copy.

## 2021-10-29 NOTE — Assessment & Plan Note (Addendum)
Continue statin. 

## 2021-10-29 NOTE — Patient Instructions (Addendum)
Call to to schedule bone density scan at your convenience: [facility] at [facility] [phone] Get flu shot at local pharmacy.  Bring [place] a copy of your advanced directive (living will).  Continue current medicines.  Consider coming off nortriptyline 25mg  at night to see if dry mouth gets better.  Return in 1 year for next physical.   Bedtime routine checklist: 1. Avoid naps during the day 2. Avoid stimulants such as caffeine and nicotine. Avoid bedtime alcohol (it can speed onset of sleep but the body's metabolism can cause awakenings). 3. All forms of exercise help ensure sound sleep - limit vigorous exercise to morning or late afternoon 4. Avoid food too close to bedtime including chocolate (which contains caffeine) 5. Soak up natural light 6. Establish regular bedtime routine. 7. Associate bed with sleep - avoid TV, computer or phone, reading while in bed. 8. Ensure pleasant, relaxing sleep environment - quiet, dark, cool room.   Health Maintenance After Age 19 After age 15, you are at a higher risk for certain long-term diseases and infections as well as injuries from falls. Falls are a major cause of broken bones and head injuries in people who are older than age 68. Getting regular preventive care can help to keep you healthy and well. Preventive care includes getting regular testing and making lifestyle changes as recommended by your health care provider. Talk with your health care provider about: Which screenings and tests you should have. A screening is a test that checks for a disease when you have no symptoms. A diet and exercise plan that is right for you. What should I know about screenings and tests to prevent falls? Screening and testing are the best ways to find a health problem early. Early diagnosis and treatment give you the best chance of managing medical conditions that are common after age 56. Certain conditions and lifestyle choices may make you more likely to  have a fall. Your health care provider may recommend: Regular vision checks. Poor vision and conditions such as cataracts can make you more likely to have a fall. If you wear glasses, make sure to get your prescription updated if your vision changes. Medicine review. Work with your health care provider to regularly review all of the medicines you are taking, including over-the-counter medicines. Ask your health care provider about any side effects that may make you more likely to have a fall. Tell your health care provider if any medicines that you take make you feel dizzy or sleepy. Strength and balance checks. Your health care provider may recommend certain tests to check your strength and balance while standing, walking, or changing positions. Foot health exam. Foot pain and numbness, as well as not wearing proper footwear, can make you more likely to have a fall. Screenings, including: Osteoporosis screening. Osteoporosis is a condition that causes the bones to get weaker and break more easily. Blood pressure screening. Blood pressure changes and medicines to control blood pressure can make you feel dizzy. Depression screening. You may be more likely to have a fall if you have a fear of falling, feel depressed, or feel unable to do activities that you used to do. Alcohol use screening. Using too much alcohol can affect your balance and may make you more likely to have a fall. Follow these instructions at home: Lifestyle Do not drink alcohol if: Your health care provider tells you not to drink. If you drink alcohol: Limit how much you have to: 0-1 drink a  day for women. 0-2 drinks a day for men. Know how much alcohol is in your drink. In the [place] one drink equals one 12 oz bottle of beer (355 mL), one 5 oz glass of wine (148 mL), or one 1 oz glass of hard liquor (44 mL). Do not use any products that contain nicotine or tobacco. These products include cigarettes, chewing tobacco, and vaping  devices, such as e-cigarettes. If you need help quitting, ask your health care provider. Activity  Follow a regular exercise program to stay fit. This will help you maintain your balance. Ask your health care provider what types of exercise are appropriate for you. If you need a cane or walker, use it as recommended by your health care provider. Wear supportive shoes that have nonskid soles. Safety  Remove any tripping hazards, such as rugs, cords, and clutter. Install safety equipment such as grab bars in bathrooms and safety rails on stairs. Keep rooms and walkways well-lit. General instructions Talk with your health care provider about your risks for falling. Tell your health care provider if: You fall. Be sure to tell your health care provider about all falls, even ones that seem minor. You feel dizzy, tiredness (fatigue), or off-balance. Take over-the-counter and prescription medicines only as told by your health care provider. These include supplements. Eat a healthy diet and maintain a healthy weight. A healthy diet includes low-fat dairy products, low-fat (lean) meats, and fiber from whole grains, beans, and lots of fruits and vegetables. Stay current with your vaccines. Schedule regular health, dental, and eye exams. Summary Having a healthy lifestyle and getting preventive care can help to protect your health and wellness after age 37. Screening and testing are the best way to find a health problem early and help you avoid having a fall. Early diagnosis and treatment give you the best chance for managing medical conditions that are more common for people who are older than age 66. Falls are a major cause of broken bones and head injuries in people who are older than age 57. Take precautions to prevent a fall at home. Work with your health care provider to learn what changes you can make to improve your health and wellness and to prevent falls. This information is not intended to  replace advice given to you by your health care provider. Make sure you discuss any questions you have with your health care provider. Document Revised: 05/28/2020 Document Reviewed: 05/28/2020 Elsevier Patient Education  [place].

## 2021-10-29 NOTE — Progress Notes (Signed)
Patient ID: Cassie Shaw, female    DOB: 08-05-1945, 76 y.o.   MRN: [id number]  This visit was conducted in person.  BP 116/82 (BP Location: Right Arm, Patient Position: Sitting, Cuff Size: Normal)   Pulse 74   Temp (!) 97.3 F (36.3 C)   Ht 5\' 3"  (1.6 m)   Wt 128 lb (58.1 kg)   SpO2 98%   BMI 22.67 kg/m    CC: CPE Subjective:   HPI: Cassie Shaw is a 76 y.o. female presenting on 10/29/2021 for Medicare Wellness   Did not see health advisor this year.  Has Pharmacist, community, doesn't have medicare.    Vision Screening   Right eye Left eye Both eyes  Without correction 20/25 20/25 20/25   With correction     Hearing Screening - Comments:: Passed whisper test. Audiometry was unavailable  [facility] Visit from 10/29/2021 in [facility] [place]  PHQ-2 Total Score 2          10/29/2021    3:26 PM  [place] in the past year? 0  Number falls in past yr: 0  Injury with Fall? 0  Risk for fall due to : No Fall Risks  Follow up Falls evaluation completed      H/o lung cancer s/p full excision, did not need adjuvant treatment or XRT.  VIDEO ASSISTED THORACOSCOPY (VATS)/WEDGE RESECTION Left            02/2020 - Robotic left video-assisted thoracoscopy, L lower lobe wedge resection, L lower lobectomy and LN dissection Roxan Hockey) Now only seeing oncology (Dr Janese Banks and Beckey Rutter)  Notes ongoing trouble due to paresthesias.  Preventative: COLONOSCOPY 08/2017 - 5 TAs, rpt 5 yrs Collene Mares) - will likely age out  Mammogram 10/2020 Birads1 @ [facility]  Well woman exam - s/p partial hysterectomy 1975 for heavy bleeding, ovaries remain. No pelvic pain or vaginal bleeding.  Lung cancer screening - see above, s/p treatment for stage Ia lung cancer.  DEXA 01/2013 T -3.5 spine, -2.5 hip  DEXA 03/2019 T -3.4 spine, -3.4 hip, -3.9 forearm - fosamax started, however doesn't remember to take regularly.  Flu shot - yearly Linda 02/2019,  03/2019, booster 10/2019 Td 2007 Prevnar-13 - 06/2017 Pneumovax - 03/2020 Shingrix - 06/2017, 08/2017 Advanced directive discussion - would want daughter [name] to be HCPOA. Ok with temporary measure, doesn't want prolonged life support if terminal condition. Packet previously provided.  Seat belt use discussed Sunscreen use discussed. No changing moles on skin.  Smoking - ex smoker quit remotely  Alcohol  - none Dentist - yearly - has partial upper and lower dentures Eye exam - due  Bowel - chronic constipation managed with activia and miralax and laxatives PRN. Linzess was expensive and didn't really help.  Bladder - no incontinence    Lives with husband [name]), 2 dogs and a bird Occupation: retired, was Armed forces logistics/support/administrative officer  Activity: regular treadmill use - walks 1 mi/day Diet: good water, fruits/vegetables daily     Relevant past medical, surgical, family and social history reviewed and updated as indicated. Interim medical history since our last visit reviewed. Allergies and medications reviewed and updated. Outpatient Medications Prior to Visit  Medication Sig Dispense Refill   acetaminophen (TYLENOL) 325 MG tablet Take 650 mg by mouth every 6 (six) hours as needed.     Cholecalciferol (VITAMIN D3) 1000 units CAPS Take 1 capsule (1,000 Units total) by mouth daily. [place]lin  capsule    ibuprofen (ADVIL) 200 MG tablet Take 200 mg by mouth 2 (two) times daily as needed for moderate pain.     ipratropium (ATROVENT) 0.06 % nasal spray Place 2 sprays into both nostrils 4 (four) times daily. 15 mL 1   melatonin 1 MG TABS tablet Take 3 mg by mouth at bedtime.     Multiple Vitamin (MULTIVITAMIN) tablet Take 1 tablet by mouth daily.     alendronate (FOSAMAX) 70 MG tablet TAKE 1 TABLET BY MOUTH ONE TIME PER WEEK 12 tablet 0   gabapentin (NEURONTIN) 600 MG tablet TAKE 1 TABLET BY MOUTH TWICE A DAY 180 tablet 1   lovastatin (MEVACOR) 40 MG tablet TAKE 1 TABLET BY MOUTH EVERYDAY AT BEDTIME 90 tablet 0    nortriptyline (PAMELOR) 25 MG capsule TAKE 1 CAPSULE BY MOUTH EVERYDAY AT BEDTIME 90 capsule 1   sulfamethoxazole-trimethoprim (BACTRIM DS) 800-160 MG tablet Take 1 tablet by mouth 2 (two) times daily. 14 tablet 0   No facility-administered medications prior to visit.     Per HPI unless specifically indicated in ROS section below Review of Systems  Constitutional:  Negative for activity change, appetite change, chills, fatigue, fever and unexpected weight change.  HENT:  Negative for hearing loss.   Eyes:  Negative for visual disturbance.  Respiratory:  Positive for cough and wheezing. Negative for chest tightness and shortness of breath.   Cardiovascular:  Negative for chest pain, palpitations and leg swelling.  Gastrointestinal:  Positive for constipation. Negative for abdominal distention, abdominal pain, blood in stool, diarrhea, nausea and vomiting.  Genitourinary:  Negative for difficulty urinating and hematuria.  Musculoskeletal:  Negative for arthralgias, myalgias and neck pain.       Leg pains  Skin:  Negative for rash.  Neurological:  Negative for dizziness, seizures, syncope and headaches.  Hematological:  Negative for adenopathy. Does not bruise/bleed easily.  Psychiatric/Behavioral:  Positive for dysphoric mood. The patient is not nervous/anxious.     Objective:  BP 116/82 (BP Location: Right Arm, Patient Position: Sitting, Cuff Size: Normal)   Pulse 74   Temp (!) 97.3 F (36.3 C)   Ht 5\' 3"  (1.6 m)   Wt 128 lb (58.1 kg)   SpO2 98%   BMI 22.67 kg/m   Wt Readings from Last 3 Encounters:  10/29/21 128 lb (58.1 kg)  08/15/21 131 lb 6 oz (59.6 kg)  07/09/21 131 lb 4 oz (59.5 kg)      Physical Exam Vitals and nursing note reviewed.  Constitutional:      Appearance: Normal appearance. She is not ill-appearing.  HENT:     Head: Normocephalic and atraumatic.     Right Ear: Tympanic membrane, ear canal and external ear normal. There is no impacted cerumen.      Left Ear: Tympanic membrane, ear canal and external ear normal. There is no impacted cerumen.  Eyes:     General:        Right eye: No discharge.        Left eye: No discharge.     Extraocular Movements: Extraocular movements intact.     Conjunctiva/sclera: Conjunctivae normal.     Pupils: Pupils are equal, round, and reactive to light.  Neck:     Thyroid: No thyroid mass or thyromegaly.     Vascular: No carotid bruit.  Cardiovascular:     Rate and Rhythm: Normal rate and regular rhythm.     Pulses: Normal pulses.     Heart sounds:  Normal heart sounds. No murmur heard. Pulmonary:     Effort: Pulmonary effort is normal. No respiratory distress.     Breath sounds: Normal breath sounds. No wheezing, rhonchi or rales.  Abdominal:     General: Bowel sounds are normal. There is no distension.     Palpations: Abdomen is soft. There is no mass.     Tenderness: There is no abdominal tenderness. There is no guarding or rebound.     Hernia: No hernia is present.  Musculoskeletal:     Cervical back: Normal range of motion and neck supple. No rigidity.     Right lower leg: No edema.     Left lower leg: No edema.  Lymphadenopathy:     Cervical: No cervical adenopathy.  Skin:    General: Skin is warm and dry.     Findings: No rash.  Neurological:     General: No focal deficit present.     Mental Status: She is alert. Mental status is at baseline.  Psychiatric:        Mood and Affect: Mood normal.        Behavior: Behavior normal.       Results for orders placed or performed in visit on 10/22/21  TSH  Result Value Ref Range   TSH 2.07 0.35 - 5.50 uIU/mL  VITAMIN D 25 Hydroxy (Vit-D Deficiency, Fractures)  Result Value Ref Range   VITD 48.95 30.00 - 100.00 ng/mL  Lipid panel  Result Value Ref Range   Cholesterol 187 0 - 200 mg/dL   Triglycerides 192.0 (H) 0.0 - 149.0 mg/dL   HDL 65.20 >39.00 mg/dL   VLDL 38.4 0.0 - 40.0 mg/dL   LDL Cholesterol 84 0 - 99 mg/dL   Total CHOL/HDL  Ratio 3    NonHDL 122.28   Comprehensive metabolic panel  Result Value Ref Range   Sodium 140 135 - 145 mEq/L   Potassium 4.8 3.5 - 5.1 mEq/L   Chloride 101 96 - 112 mEq/L   CO2 29 19 - 32 mEq/L   Glucose, Bld 97 70 - 99 mg/dL   BUN 13 6 - 23 mg/dL   Creatinine, Ser 0.84 0.40 - 1.20 mg/dL   Total Bilirubin 0.6 0.2 - 1.2 mg/dL   Alkaline Phosphatase 32 (L) 39 - 117 U/L   AST 22 0 - 37 U/L   ALT 15 0 - 35 U/L   Total Protein 7.7 6.0 - 8.3 g/dL   Albumin 4.4 3.5 - 5.2 g/dL   GFR 67.07 >60.00 mL/min   Calcium 9.5 8.4 - 10.5 mg/dL  Vitamin B12  Result Value Ref Range   Vitamin B-12 >1500 (H) 211 - 911 pg/mL    Assessment & Plan:   Problem List Items Addressed This Visit     Health maintenance examination - Primary (Chronic)    Preventative protocols reviewed and updated unless pt declined. Discussed healthy diet and lifestyle.       Advanced care planning/counseling discussion (Chronic)    Does not have this at home. Asked to complete and bring [place] a copy.       Dyslipidemia    Chronic, stable on lovastatin 40mg  - continue this. The 10-year ASCVD risk score (Arnett DK, et al., 2019) is: 16.8%   Values used to calculate the score:     Age: 89 years     Sex: Female     Is Non-Hispanic African American: No     Diabetic: No     Tobacco  smoker: No     Systolic Blood Pressure: 440 mmHg     Is BP treated: No     HDL Cholesterol: 65.2 mg/dL     Total Cholesterol: 187 mg/dL       Relevant Medications   lovastatin (MEVACOR) 40 MG tablet   Burning sensation of foot    Chronic, continue gabapentin. Trial off nortriptyline to see if dry mouth improves.       Osteoporosis    Prior DEXA done through endo.  # provided to call and schedule rpt [facility] at [facility] as due.  On fosamax, unsure when it was started.  prolia was unaffordable.       Relevant Medications   alendronate (FOSAMAX) 70 MG tablet   Other Relevant Orders   DG Bone Density   Vitamin B12 deficiency     Levels remain high - advised stop B12 replacement.       COPD (chronic obstructive pulmonary disease) (HCC)    Stable period, overall asxs off respiratory medication.       Thoracic aortic atherosclerosis (HCC)    Continue statin.       Relevant Medications   lovastatin (MEVACOR) 40 MG tablet   Abnormal TSH    Recent TSH normal.       History of lung cancer in adulthood    Appreciate oncology care, seeing q6 mo.      Chronic constipation    Linzess did not help.  Continue miralax and stool softener.         Meds ordered this encounter  Medications   alendronate (FOSAMAX) 70 MG tablet    Sig: Take with a full glass of water on an empty stomach.    Dispense:  12 tablet    Refill:  3   gabapentin (NEURONTIN) 600 MG tablet    Sig: Take 1 tablet (600 mg total) by mouth 2 (two) times daily.    Dispense:  180 tablet    Refill:  3   lovastatin (MEVACOR) 40 MG tablet    Sig: Take 1 tablet (40 mg total) by mouth at bedtime.    Dispense:  90 tablet    Refill:  3   nortriptyline (PAMELOR) 25 MG capsule    Sig: Take 1 capsule (25 mg total) by mouth at bedtime.    Dispense:  90 capsule    Refill:  3   Orders Placed This Encounter  Procedures   DG Bone Density    Standing Status:   Future    Standing Expiration Date:   10/30/2022    Order Specific Question:   Reason for Exam (SYMPTOM  OR DIAGNOSIS REQUIRED)    Answer:   osteoporosis f/u    Order Specific Question:   Preferred imaging location?    Answer:   [place]     Patient instructions: Call to to schedule bone density scan at your convenience: [facility] [phone] Get flu shot at local pharmacy.  Bring [place] a copy of your advanced directive (living will).  Continue current medicines.  Consider coming off nortriptyline 25mg  at night to see if dry mouth gets better.  Return in 1 year for next physical.   Bedtime routine checklist: 1. Avoid naps during the day 2. Avoid  stimulants such as caffeine and nicotine. Avoid bedtime alcohol (it can speed onset of sleep but the body's metabolism can cause awakenings). 3. All forms of exercise help ensure sound sleep - limit vigorous exercise to morning  or late afternoon 4. Avoid food too close to bedtime including chocolate (which contains caffeine) 5. Soak up natural light 6. Establish regular bedtime routine. 7. Associate bed with sleep - avoid TV, computer or phone, reading while in bed. 8. Ensure pleasant, relaxing sleep environment - quiet, dark, cool room.   Follow up plan: Return in about 1 year (around 10/30/2022) for annual exam, prior fasting for blood work.  Ria Bush, MD

## 2021-11-11 ENCOUNTER — Ambulatory Visit
Admission: RE | Admit: 2021-11-11 | Discharge: 2021-11-11 | Disposition: A | Discharge status: Home / Self Care | Payer: Federal, State, Local not specified - PPO | Source: Ambulatory Visit | Attending: Nurse Practitioner | Admitting: Nurse Practitioner

## 2021-11-11 DIAGNOSIS — C3492 Malignant neoplasm of unspecified part of left bronchus or lung: Secondary | ICD-10-CM | POA: Insufficient documentation

## 2021-11-13 ENCOUNTER — Ambulatory Visit: Payer: Federal, State, Local not specified - PPO | Admitting: Oncology

## 2021-11-15 ENCOUNTER — Ambulatory Visit
Admission: RE | Admit: 2021-11-15 | Discharge: 2021-11-15 | Disposition: A | Discharge status: Home / Self Care | Payer: Federal, State, Local not specified - PPO | Source: Ambulatory Visit | Attending: Family Medicine | Admitting: Family Medicine

## 2021-11-15 ENCOUNTER — Encounter: Payer: Self-pay | Admitting: Oncology

## 2021-11-15 ENCOUNTER — Inpatient Hospital Stay: Payer: Federal, State, Local not specified - PPO | Attending: Oncology | Admitting: Oncology

## 2021-11-15 VITALS — Temp 97.1°F | Resp 16 | Wt 130.5 lb

## 2021-11-15 DIAGNOSIS — Z1231 Encounter for screening mammogram for malignant neoplasm of breast: Secondary | ICD-10-CM | POA: Diagnosis present

## 2021-11-15 DIAGNOSIS — Z79899 Other long term (current) drug therapy: Secondary | ICD-10-CM | POA: Insufficient documentation

## 2021-11-15 DIAGNOSIS — Z720 Tobacco use: Secondary | ICD-10-CM | POA: Diagnosis not present

## 2021-11-15 DIAGNOSIS — Z85118 Personal history of other malignant neoplasm of bronchus and lung: Secondary | ICD-10-CM

## 2021-11-15 DIAGNOSIS — Z08 Encounter for follow-up examination after completed treatment for malignant neoplasm: Secondary | ICD-10-CM | POA: Diagnosis not present

## 2021-11-15 DIAGNOSIS — C3432 Malignant neoplasm of lower lobe, left bronchus or lung: Secondary | ICD-10-CM | POA: Insufficient documentation

## 2021-11-15 NOTE — Progress Notes (Signed)
Hematology/Oncology Consult note [facility]  Telephone:([phone] [phone]  Patient Care Team: Ria Bush, MD as PCP - General (Family Medicine) Telford Nab, RN as Oncology Nurse Navigator   Name of the patient: Cassie Shaw  [id number]  Sep 03, 1945   Date of visit: 11/15/21  Diagnosis-history of lung cancer in 2021 s/p resection  Chief complaint/ Reason for visit-routine follow-up of lung cancer discuss CT scan results  Heme/Onc history:  Patient is a 76 year old female with history of irritable bowel syndrome, peripheral neuropathy, tobacco abuse among other medical problems.  She underwent a low-dose screening CT chest on October 2021 which showed a lung nodule in the left lower lobe 11.3 mm with a solid component of 6.6 mm.  There was improvement in the size of this nodule 3 months later.  A PET CT scan showed mild metabolic activity within the cavitary lesion in the left lower lobe.  No evidence of mediastinal or hilar adenopathy or distant metastatic disease.  Patient was seen by Dr. Roxan Hockey and underwent left lower lobe wedge resection on 03/15/2020.   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  No evidence of lymphovascular or visceral pleural invasion.  10 lymph nodes were negative for malignancy.  p T1b pN0  Interval history-patient reports occasional chest congestion symptoms denies any cough or exertional shortness of breath.  ECOG PS- 1 Pain scale- 0   Review of systems- Review of Systems  Constitutional:  Negative for chills, fever, malaise/fatigue and weight loss.  HENT:  Negative for congestion, ear discharge and nosebleeds.   Eyes:  Negative for blurred vision.  Respiratory:  Negative for cough, hemoptysis, sputum production, shortness of breath and wheezing.   Cardiovascular:  Negative for chest pain, palpitations, orthopnea and claudication.  Gastrointestinal:  Negative for  abdominal pain, blood in stool, constipation, diarrhea, heartburn, melena, nausea and vomiting.  Genitourinary:  Negative for dysuria, flank pain, frequency, hematuria and urgency.  Musculoskeletal:  Negative for back pain, joint pain and myalgias.  Skin:  Negative for rash.  Neurological:  Negative for dizziness, tingling, focal weakness, seizures, weakness and headaches.  Endo/Heme/Allergies:  Does not bruise/bleed easily.  Psychiatric/Behavioral:  Negative for depression and suicidal ideas. The patient does not have insomnia.       No Known Allergies   Past Medical History:  Diagnosis Date   Arthritis    B12 deficiency    COPD (chronic obstructive pulmonary disease) (HCC)    Ex-smoker    Fibrocystic breast disease    Fibromyalgia    History of chicken pox    History of depression    and anxiety   HLD (hyperlipidemia)    IBS (irritable bowel syndrome)    Osteoporosis 2015   on/off fosamax DEXA T -3.5 spine, -2.5 hip   Peripheral neuropathy    Retinal detachment      Past Surgical History:  Procedure Laterality Date   CATARACT EXTRACTION Bilateral 2014   with lens implant   COLONOSCOPY  12/2005   WNL ([facility])   COLONOSCOPY  08/2017   5 TAs, rpt 5 yrs ([facility])   DEXA  01/2013   T -3.5 spine, -2.5 hip   INTERCOSTAL NERVE BLOCK Left 03/15/2020   Procedure: INTERCOSTAL NERVE BLOCK;  Surgeon: Melrose Nakayama, MD;  Location: [place];  Service: Thoracic;  Laterality: Left;   LYMPH NODE DISSECTION Bilateral 03/15/2020   Procedure: LYMPH NODE DISSECTION;  Surgeon: Melrose Nakayama, MD;  Location: [place];  Service: Thoracic;  Laterality: Bilateral;   PARTIAL HYSTERECTOMY  1975   for heavy bleeding, ovaries remained   RETINAL DETACHMENT SURGERY  2014   Dr Zadie Rhine   VIDEO ASSISTED THORACOSCOPY (VATS)/WEDGE RESECTION Left 02/2020   Robotic left video-assisted thoracoscopy, L lower lobe wedge resection, L lower lobectomy and LN dissection Roxan Hockey)    Social History    Socioeconomic History   Marital status: Married    Spouse name: Not on file   Number of children: Not on file   Years of education: Not on file   Highest education level: Not on file  Occupational History   Not on file  Tobacco Use   Smoking status: Former    Packs/day: 1.00    Years: 46.00    Total pack years: 46.00    Types: Cigarettes    Quit date: 01/21/2004    Years since quitting: 17.8    Passive exposure: Past   Smokeless tobacco: Never  Vaping Use   Vaping Use: Never used  Substance and Sexual Activity   Alcohol use: No    Alcohol/week: 0.0 standard drinks of alcohol   Drug use: No   Sexual activity: Not on file  Other Topics Concern   Not on file  Social History Narrative   Lives with husband [name]), 2 dogs and a bird   Occupation: retired, was Armed forces logistics/support/administrative officer   Activity: no regular exercise   Diet: good water, fruits/vegetables daily   Social Determinants of Radio broadcast assistant Strain: Not on Art therapist Insecurity: Not on file  Transportation Needs: Not on file  Physical Activity: Not on file  Stress: Not on file  Social Connections: Not on file  Intimate Partner Violence: Not on file    Family History  Problem Relation Age of Onset   Arthritis Mother    Cancer Mother        smoker   Cancer Sister        bone   Diabetes Maternal Uncle    Cancer Maternal Grandfather    Stroke Neg Hx    CAD Neg Hx    Breast cancer Neg Hx      Current Outpatient Medications:    acetaminophen (TYLENOL) 325 MG tablet, Take 650 mg by mouth every 6 (six) hours as needed., Disp: , Rfl:    alendronate (FOSAMAX) 70 MG tablet, Take with a full glass of water on an empty stomach., Disp: 12 tablet, Rfl: 3   Cholecalciferol (VITAMIN D3) 1000 units CAPS, Take 1 capsule (1,000 Units total) by mouth daily., Disp: 30 capsule, Rfl:    gabapentin (NEURONTIN) 600 MG tablet, Take 1 tablet (600 mg total) by mouth 2 (two) times daily., Disp: 180 tablet, Rfl: 3   lovastatin  (MEVACOR) 40 MG tablet, Take 1 tablet (40 mg total) by mouth at bedtime., Disp: 90 tablet, Rfl: 3   melatonin 1 MG TABS tablet, Take 3 mg by mouth at bedtime., Disp: , Rfl:    Multiple Vitamin (MULTIVITAMIN) tablet, Take 1 tablet by mouth daily., Disp: , Rfl:    ibuprofen (ADVIL) 200 MG tablet, Take 200 mg by mouth 2 (two) times daily as needed for moderate pain., Disp: , Rfl:    ipratropium (ATROVENT) 0.06 % nasal spray, Place 2 sprays into both nostrils 4 (four) times daily., Disp: 15 mL, Rfl: 1   nortriptyline (PAMELOR) 25 MG capsule, Take 1 capsule (25 mg total) by mouth at bedtime. (Patient not taking: Reported on 11/15/2021), Disp: 90 capsule,  Rfl: 3  Physical exam:  Vitals:   11/15/21 0949  Resp: 16  Temp: (!) 97.1 F (36.2 C)  Weight: 130 lb 8 oz (59.2 kg)   Physical Exam Cardiovascular:     Rate and Rhythm: Normal rate and regular rhythm.     Heart sounds: Normal heart sounds.  Pulmonary:     Effort: Pulmonary effort is normal.     Breath sounds: Normal breath sounds.  Abdominal:     General: Bowel sounds are normal.     Palpations: Abdomen is soft.  Skin:    General: Skin is warm and dry.  Neurological:     Mental Status: She is alert and oriented to person, place, and time.         Latest Ref Rng & Units 10/22/2021    8:55 AM  CMP  Glucose 70 - 99 mg/dL 97   BUN 6 - 23 mg/dL 13   Creatinine 0.40 - 1.20 mg/dL 0.84   Sodium 135 - 145 mEq/L 140   Potassium 3.5 - 5.1 mEq/L 4.8   Chloride 96 - 112 mEq/L 101   CO2 19 - 32 mEq/L 29   Calcium 8.4 - 10.5 mg/dL 9.5   Total Protein 6.0 - 8.3 g/dL 7.7   Total Bilirubin 0.2 - 1.2 mg/dL 0.6   Alkaline Phos 39 - 117 U/L 32   AST 0 - 37 U/L 22   ALT 0 - 35 U/L 15       Latest Ref Rng & Units 04/18/2020   12:13 PM  CBC  WBC 4.0 - 10.5 K/uL 7.0   Hemoglobin 12.0 - 15.0 g/dL 13.0   Hematocrit 36.0 - 46.0 % 39.0   Platelets 150.0 - 400.0 K/uL 214.0     No images are attached to the encounter.  CT Chest Wo  Contrast  Result Date: 11/12/2021 CLINICAL DATA:  Lung cancer surveillance, prior wedge resection in the left lower lobe. Cough. * Tracking Code: BO * EXAM: CT CHEST WITHOUT CONTRAST TECHNIQUE: Multidetector CT imaging of the chest was performed following the standard protocol without IV contrast. RADIATION DOSE REDUCTION: This exam was performed according to the departmental dose-optimization program which includes automated exposure control, adjustment of the mA and/or kV according to patient size and/or use of iterative reconstruction technique. COMPARISON:  05/06/2021 FINDINGS: Cardiovascular: Coronary, aortic arch, and branch vessel atherosclerotic vascular disease. Mediastinum/Nodes: Right paratracheal node anterior to the carina 0.9 cm in short axis on image 50 series 2, upper normal size, stable from previous. No overtly pathologic adenopathy in the chest. Lungs/Pleura: Biapical pleuroparenchymal scarring. Fine peripheral interstitial accentuation. Stable emphysema. Stable bandlike scarring medially in the left upper lobe. No recurrent malignancy identified. Upper Abdomen: 1.9 by 1.8 cm fluid density lesion of the left kidney upper pole compatible with cyst, image 122 series 2. This does not warrant further workup. Musculoskeletal: Old healed left posterolateral rib fractures. 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 Dextroconvex thoracic scoliosis. IMPRESSION: 1. No findings of recurrent malignancy. 2. 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 Old healed left posterolateral rib fractures. 3. Aortic and coronary atherosclerosis. 4. Emphysema. Aortic Atherosclerosis (ICD10-I70.0) and Emphysema (ICD10-J43.9). Electronically Signed   By: Van Clines M.D.   On: 11/12/2021 08:50     Assessment and plan- Patient is a 76 y.o. 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  She is currently in remission and this is a visit to  discuss CT scan results and further management  I have reviewed CT chestImages independently and discussed findings with the patient which does not show any evidence of recurrent or progressive disease.  She has chronic changes of emphysema and coronary atherosclerosis which are all stable.  We will continue surveillance CT scans for lung cancer every 6 months and I will see her thereafter.   Visit Diagnosis 1. Encounter for follow-up surveillance of lung cancer      Dr. Randa Evens, MD, MPH [facility] at [facility] [phone] 11/15/2021 12:40 PM

## 2021-12-02 ENCOUNTER — Telehealth: Payer: Self-pay | Admitting: Family Medicine

## 2021-12-02 DIAGNOSIS — M810 Age-related osteoporosis without current pathological fracture: Secondary | ICD-10-CM

## 2021-12-02 NOTE — Telephone Encounter (Incomplete)
Pt's husband, [name], called asking for a call back from [place] in regards tokkk issues trying to schedule pt's bone density test. Call back # [phone] or [phone]

## 2021-12-03 NOTE — Telephone Encounter (Signed)
Faxed signed BMD order to [facility] at [phone].

## 2021-12-03 NOTE — Telephone Encounter (Signed)
Patient's  husband called stating that his wife had a bone density test done years ago at [facility]. Mr. Augenstein stated that he has talked with [facility] about scheduling the test and was advised that she should have the test done where she previous had it. Dr. Danise Mina stated that he has put in a new order. Will send message to referral coordinator to get her to work on this.

## 2021-12-03 NOTE — Telephone Encounter (Signed)
Patient's husband notified that order has been faxed to  [facility]. Mr. Rozeboom was given the telephone number to reach out and get the test ordered for his wife.

## 2021-12-03 NOTE — Telephone Encounter (Signed)
Received message from referral coordinator and was advised that this is handled by out office.

## 2021-12-03 NOTE — Addendum Note (Signed)
Addended by: Ria Bush on: 12/03/2021 09:04 AM   Modules accepted: Orders

## 2021-12-03 NOTE — Telephone Encounter (Signed)
New order placed

## 2021-12-10 LAB — HM DEXA SCAN

## 2021-12-23 ENCOUNTER — Encounter: Payer: Self-pay | Admitting: Family Medicine

## 2021-12-23 ENCOUNTER — Telehealth: Payer: Self-pay | Admitting: Family Medicine

## 2021-12-23 DIAGNOSIS — M810 Age-related osteoporosis without current pathological fracture: Secondary | ICD-10-CM

## 2021-12-23 NOTE — Telephone Encounter (Signed)
Plz notify recent DEXA scan 12/10/2021 showed persistent severe osteoporosis which means she's at high risk of fragility fracture if she has a fall. Please have her continue fosamax weekly, should consider re-pricing out prolia if they're interested in change in therapy.  Continue calcium, vitamin D regularly and regular weight bearing exercise such as walking.   DEXA 11/2021 - T -3.6 LFN, -4.4 forearm

## 2021-12-24 ENCOUNTER — Other Ambulatory Visit: Payer: Self-pay | Admitting: Family Medicine

## 2021-12-24 ENCOUNTER — Other Ambulatory Visit: Payer: Self-pay | Admitting: Nurse Practitioner

## 2021-12-24 DIAGNOSIS — R0982 Postnasal drip: Secondary | ICD-10-CM

## 2021-12-24 DIAGNOSIS — M810 Age-related osteoporosis without current pathological fracture: Secondary | ICD-10-CM

## 2021-12-24 NOTE — Telephone Encounter (Signed)
Spoke with Cassie Shaw relaying results and Dr. Synthia Innocent message.  Cassie Shaw verbalizes understanding.  I also sent same results as reply to Cassie Shaw's 12/23/21 MyChart message.

## 2021-12-24 NOTE — Telephone Encounter (Signed)
Too early. Rx sent on 10/29/21, #12/3 to [facility], [address].

## 2021-12-24 NOTE — Telephone Encounter (Signed)
Pt aware. (See 12/23/21 phn note.)

## 2021-12-25 ENCOUNTER — Encounter: Payer: Self-pay | Admitting: Family Medicine

## 2022-01-23 ENCOUNTER — Telehealth: Payer: Self-pay | Admitting: Family Medicine

## 2022-01-23 NOTE — Telephone Encounter (Signed)
Spoke with pt asking if she was able to find out from ins co how much Prolia inj would be (see 12/23/21 pt msg). Says she did and was told about $500. However, pt still wants to proceed with shot but wants to discuss with Dr. Darnell Level first. Scheduled OV on 01/28/22 at 9:00.

## 2022-01-23 NOTE — Telephone Encounter (Signed)
Pt stated she was diagnosed with "osteoporosis" & was told there was a shot for condition. Pt is asking where could she receive the shot? Call back # [phone]

## 2022-01-29 ENCOUNTER — Encounter: Payer: Self-pay | Admitting: Family Medicine

## 2022-01-29 ENCOUNTER — Ambulatory Visit: Payer: Federal, State, Local not specified - PPO | Admitting: Family Medicine

## 2022-01-29 VITALS — BP 124/80 | HR 88 | Temp 97.7°F | Ht 63.0 in | Wt 128.5 lb

## 2022-01-29 DIAGNOSIS — M810 Age-related osteoporosis without current pathological fracture: Secondary | ICD-10-CM

## 2022-01-29 DIAGNOSIS — J22 Unspecified acute lower respiratory infection: Secondary | ICD-10-CM | POA: Insufficient documentation

## 2022-01-29 LAB — BASIC METABOLIC PANEL
BUN: 9 mg/dL (ref 6–23)
CO2: 28 mEq/L (ref 19–32)
Calcium: 9.4 mg/dL (ref 8.4–10.5)
Chloride: 102 mEq/L (ref 96–112)
Creatinine, Ser: 0.78 mg/dL (ref 0.40–1.20)
GFR: 73.17 mL/min (ref >60.00)
Glucose, Bld: 97 mg/dL (ref 70–99)
Potassium: 4.5 mEq/L (ref 3.5–5.1)
Sodium: 140 mEq/L (ref 135–145)

## 2022-01-29 MED ORDER — AZITHROMYCIN 250 MG PO TABS: ORAL_TABLET | ORAL | 0 refills | Status: DC

## 2022-01-29 MED ORDER — CHERATUSSIN AC 100-10 MG/5ML PO SOLN: 5.0000 mL | Freq: Two times a day (BID) | ORAL | 0 refills | Status: DC | PRN

## 2022-01-29 NOTE — Assessment & Plan Note (Signed)
Anticipate acute bronchitis, likely viral given short duration. However husband recently ill with multilobar pneumonia. Supportive measures reviewed. Rx cheratussin. WASP for zpack with instructions when to fill. Update if not improving with this.

## 2022-01-29 NOTE — Patient Instructions (Addendum)
We will price out prolia injections every 6 months in place of fosamax.  We will call you with price and to schedule appointment.  Labs today in preparation for prolia shot.  Look at [place] (denosumab) handout provided today.   For cough - push fluids and rest. May take codeine cough syrup - prescription printed out today. Wait and see prescription for antibiotic printed out today as well - fill if fever, or worsening productive cough develops. Let us know if not improving with treatment.

## 2022-01-29 NOTE — Progress Notes (Signed)
Patient ID: Cassie Shaw, female    DOB: [birth year], 77 y.o.   MRN: [id number]  This visit was conducted in person.  BP 124/80 (BP Location: Left Arm, Patient Position: Sitting)   Pulse 88   Temp 97.7 F (36.5 C) (Skin)   Ht 5\' 3"  (1.6 m)   Wt 128 lb 8 oz (58.3 kg)   SpO2 98%   BMI 22.76 kg/m    CC: discuss osteoporosis, cough Subjective:   HPI: Cassie Shaw is a 77 y.o. female presenting on 01/29/2022 for Follow-up (Discuss prolia) and Cough (Started with a cold about a week ago)   Longstanding history of osteoporosis, on and off on fosamax over the past 8+ years. Prolia previously unaffordable (03/2019).   No planned dental work.   Latest DEXA 11/2021 - T -3.6 LFN, -4.4 forearm which was a worsening from prior.  Cough and congestion - present for the past week. PNdrainage, productive cough of green mucous. Both head and chest congestion. No fevers/chills, ear or tooth pain. Non smoker. No h/o asthma. Has tried OTC remedies including tussin nyquil and sinus/mucous pills. Tried husband's tessalon perls without benefit.   Husband recently had multilobar pneumonia seen at ER treated with levaquin.   She's not been taking nortriptyline regularly.      Relevant past medical, surgical, family and social history reviewed and updated as indicated. Interim medical history since our last visit reviewed. Allergies and medications reviewed and updated. Outpatient Medications Prior to Visit  Medication Sig Dispense Refill   acetaminophen (TYLENOL) 325 MG tablet Take 650 mg by mouth every 6 (six) hours as needed.     alendronate (FOSAMAX) 70 MG tablet Take with a full glass of water on an empty stomach. 12 tablet 3   Cholecalciferol (VITAMIN D3) 1000 units CAPS Take 1 capsule (1,000 Units total) by mouth daily. 30 capsule    gabapentin (NEURONTIN) 600 MG tablet Take 1 tablet (600 mg total) by mouth 2 (two) times daily. 180 tablet 3   ibuprofen (ADVIL) 200 MG tablet Take 200 mg by mouth  2 (two) times daily as needed for moderate pain.     ipratropium (ATROVENT) 0.06 % nasal spray Place 2 sprays into both nostrils 4 (four) times daily. 15 mL 1   lovastatin (MEVACOR) 40 MG tablet Take 1 tablet (40 mg total) by mouth at bedtime. 90 tablet 3   melatonin 1 MG TABS tablet Take 3 mg by mouth at bedtime.     Multiple Vitamin (MULTIVITAMIN) tablet Take 1 tablet by mouth daily.     nortriptyline (PAMELOR) 25 MG capsule Take 1 capsule (25 mg total) by mouth at bedtime. (Patient taking differently: Take 25 mg by mouth at bedtime as needed.) 90 capsule 3   No facility-administered medications prior to visit.     Per HPI unless specifically indicated in ROS section below Review of Systems  Objective:  BP 124/80 (BP Location: Left Arm, Patient Position: Sitting)   Pulse 88   Temp 97.7 F (36.5 C) (Skin)   Ht 5\' 3"  (1.6 m)   Wt 128 lb 8 oz (58.3 kg)   SpO2 98%   BMI 22.76 kg/m   Wt Readings from Last 3 Encounters:  01/29/22 128 lb 8 oz (58.3 kg)  11/15/21 130 lb 8 oz (59.2 kg)  10/29/21 128 lb (58.1 kg)      Physical Exam Vitals and nursing note reviewed.  Constitutional:      Appearance: Normal appearance. She is  not ill-appearing.  HENT:     Head: Normocephalic and atraumatic.     Right Ear: Tympanic membrane, ear canal and external ear normal. There is no impacted cerumen.     Left Ear: Tympanic membrane, ear canal and external ear normal. There is no impacted cerumen.     Nose: Congestion present. No rhinorrhea.     Mouth/Throat:     Mouth: Mucous membranes are moist.     Pharynx: Oropharynx is clear. No oropharyngeal exudate or posterior oropharyngeal erythema.  Eyes:     Extraocular Movements: Extraocular movements intact.     Conjunctiva/sclera: Conjunctivae normal.     Pupils: Pupils are equal, round, and reactive to light.  Cardiovascular:     Rate and Rhythm: Normal rate and regular rhythm.     Pulses: Normal pulses.     Heart sounds: Normal heart sounds.  No murmur heard. Pulmonary:     Effort: Pulmonary effort is normal. No respiratory distress.     Breath sounds: Normal breath sounds. No wheezing, rhonchi or rales.     Comments: Lungs clear Lymphadenopathy:     Head:     Right side of head: No submental, submandibular, tonsillar, preauricular or posterior auricular adenopathy.     Left side of head: No submental, submandibular, tonsillar, preauricular or posterior auricular adenopathy.     Cervical: No cervical adenopathy.     Right cervical: No superficial cervical adenopathy.    Left cervical: No superficial cervical adenopathy.     Upper Body:     Right upper body: No supraclavicular adenopathy.     Left upper body: No supraclavicular adenopathy.  Skin:    Findings: No rash.  Neurological:     Mental Status: She is alert.  Psychiatric:        Mood and Affect: Mood normal.        Behavior: Behavior normal.       Results for orders placed or performed in visit on 12/25/21  HM DEXA SCAN  Result Value Ref Range   HM Dexa Scan Osteoporosis    Assessment & Plan:   Acute respiratory infection Assessment & Plan: Anticipate acute bronchitis, likely viral given short duration. However husband recently ill with multilobar pneumonia. Supportive measures reviewed. Rx cheratussin. WASP for zpack with instructions when to fill. Update if not improving with this.    Age-related osteoporosis without current pathological fracture Assessment & Plan: Discussed options. She's been on fosamax for 8+ years.  She's interested in starting prolia. Reviewed mechanism of action of medication, side effects and adverse effects including atypical hip fractures and osteonecrosis of jaw. To check with dentist prior to starting. Handout provided.  Will price out prolia in place of fosamax and call pt to schedule injection appt. Check BMP today for Cr/Ca levels.   Orders: -     Basic metabolic panel  Other orders -     Cheratussin AC; Take 5 mLs by  mouth 2 (two) times daily as needed for cough (sedation precautions).  Dispense: 120 mL; Refill: 0 -     Azithromycin; Take two tablets on day one followed by one tablet on days 2-5  Dispense: 6 each; Refill: 0   Patient Instructions  We will price out prolia injections every 6 months in place of fosamax.  We will call you with price and to schedule appointment.  Labs today in preparation for prolia shot.  Look at [place] (denosumab) handout provided today.   For cough - push fluids and  rest. May take codeine cough syrup - prescription printed out today. Wait and see prescription for antibiotic printed out today as well - fill if fever, or worsening productive cough develops. Let us know if not improving with treatment.    Follow up plan: Return if symptoms worsen or fail to improve.  Ria Bush, MD

## 2022-01-29 NOTE — Assessment & Plan Note (Addendum)
Discussed options. She's been on fosamax for 8+ years.  She's interested in starting prolia. Reviewed mechanism of action of medication, side effects and adverse effects including atypical hip fractures and osteonecrosis of jaw. To check with dentist prior to starting. Handout provided.  Will price out prolia in place of fosamax and call pt to schedule injection appt. Check BMP today for Cr/Ca levels.

## 2022-02-05 NOTE — Telephone Encounter (Signed)
Patient has followed up with Dr. Reece Agar. Can you process benefits

## 2022-02-06 ENCOUNTER — Other Ambulatory Visit (HOSPITAL_COMMUNITY): Payer: Self-pay

## 2022-02-06 NOTE — Telephone Encounter (Signed)
Prolia VOB initiated via [email or web address]

## 2022-02-13 ENCOUNTER — Encounter: Payer: Self-pay | Admitting: Family Medicine

## 2022-02-14 ENCOUNTER — Other Ambulatory Visit (HOSPITAL_COMMUNITY): Payer: Self-pay

## 2022-02-14 NOTE — Telephone Encounter (Signed)
Ok to start prolia. Last kidney check normal 01/29/2022.

## 2022-02-14 NOTE — Telephone Encounter (Signed)
Pharmacy Patient Advocate Encounter   Received notification that prior authorization for Prolia 60mg  is required/requested.  Per Test Claim: PA required   PA submitted on 02/14/22 to (ins) [employer] via CoverMyMeds Key [id number] Status is pending

## 2022-02-18 ENCOUNTER — Other Ambulatory Visit: Payer: Self-pay

## 2022-02-18 MED ORDER — DENOSUMAB 60 MG/ML ~~LOC~~ SOSY: 60.0000 mg | PREFILLED_SYRINGE | SUBCUTANEOUS | 0 refills | Status: DC

## 2022-02-19 ENCOUNTER — Other Ambulatory Visit (HOSPITAL_COMMUNITY): Payer: Self-pay

## 2022-02-19 NOTE — Telephone Encounter (Signed)
Patient Advocate Encounter  Prior Authorization for Prolia 60mg  has been approved.    PA# [id number] Effective dates: 01/15/22 through 02/14/23

## 2022-02-20 NOTE — Telephone Encounter (Signed)
Patient sch for Prolia inj on 02/25/22.  No new labs needed, BMP on 01/29/22.  Patient notified she needs to bring in $350 on date of inj and if there is any difference, she will get a bill at a later date.

## 2022-02-20 NOTE — Telephone Encounter (Signed)
New start can you run benefits?

## 2022-02-20 NOTE — Telephone Encounter (Signed)
Please call patient to set up first prolia. Do not see where they have given amount for patient due. Her deductible per form is $350 so I would let her know that is what is due at time of service. If anything over that we can bill at later time.

## 2022-02-25 ENCOUNTER — Ambulatory Visit (INDEPENDENT_AMBULATORY_CARE_PROVIDER_SITE_OTHER): Payer: Federal, State, Local not specified - PPO | Admitting: *Deleted

## 2022-02-25 DIAGNOSIS — M810 Age-related osteoporosis without current pathological fracture: Secondary | ICD-10-CM | POA: Diagnosis not present

## 2022-02-25 MED ORDER — DENOSUMAB 60 MG/ML ~~LOC~~ SOSY
60.0000 mg | PREFILLED_SYRINGE | Freq: Once | SUBCUTANEOUS | Status: AC
  Administered 2022-02-25: 60 mg via SUBCUTANEOUS

## 2022-02-25 NOTE — Progress Notes (Signed)
Per orders of Dr. Danise Mina, injection of Prolia given by Emelia Salisbury. Patient tolerated injection well.

## 2022-03-24 IMAGING — CT NM PET TUM IMG INITIAL (PI) SKULL BASE T - THIGH
1 of 9 series · 1 of 25 positions shown · non-contrast
Comparison: None.

CLINICAL DATA: Initial treatment strategy for lung nodule.

EXAM:
NUCLEAR MEDICINE PET SKULL BASE TO THIGH
TECHNIQUE: 7.8 mCi F-18 FDG was injected intravenously. Full-ring PET imaging
was performed from the skull base to thigh after the radiotracer. CT
data was obtained and used for attenuation correction and anatomic
localization.
Fasting blood glucose: 87 mg/dl

[Series 3: ct wb 5.0 b30f · axial · 5.0mm · 0.98mm/px · 1 of 290 slices shown]
[im 290/290  brain]
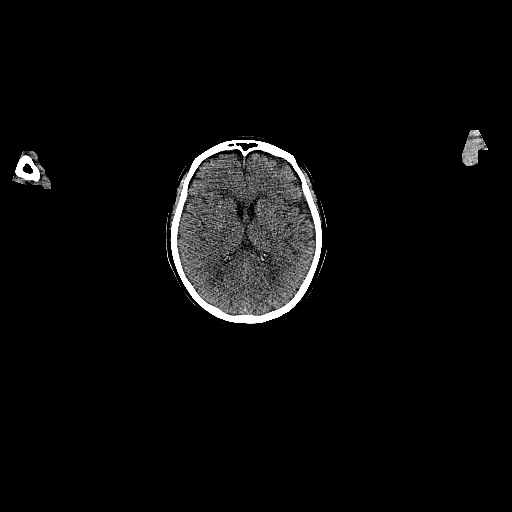

[1 of 25 positions shown; findings below may reference images not displayed]

FINDINGS: Mediastinal blood pool activity: SUV max

Liver activity: SUV max NA

NECK: No hypermetabolic lymph nodes in the neck.

Incidental CT findings: none

CHEST: Within the periphery of the LEFT lower lobe there is a
cavitary nodule with asymmetric thickened border. The thickened
portion of the lesion has associated metabolic activity with SUV max
equal 2.4 (image 112). This activity is mild and similar to
background blood pool activity.

No additional suspicious pulmonary nodules.

No hypermetabolic mediastinal lymph nodes or enlarged mediastinal
nodes.

Incidental CT findings: none

ABDOMEN/PELVIS: No abnormal hypermetabolic activity within the
liver, pancreas, adrenal glands, or spleen. No hypermetabolic lymph
nodes in the abdomen or pelvis.

Incidental CT findings: Post hysterectomy.  Adnexa unremarkable

SKELETON: No focal hypermetabolic activity to suggest skeletal
metastasis.

Incidental CT findings: none
IMPRESSION: 1. Mild metabolic activity associated with the a cavitary lesion in
the LEFT lower lobe. The nodular portion is mildly hypermetabolic.
Lesion is indeterminate. Recommend either consider continued CT
surveillance versus tissue sampling.
2. No evidence metastatic adenopathy or distant metastatic disease

## 2022-05-07 ENCOUNTER — Ambulatory Visit
Admission: RE | Admit: 2022-05-07 | Discharge: 2022-05-07 | Disposition: A | Discharge status: Home / Self Care | Payer: Federal, State, Local not specified - PPO | Source: Ambulatory Visit | Attending: Oncology | Admitting: Oncology

## 2022-05-07 DIAGNOSIS — Z08 Encounter for follow-up examination after completed treatment for malignant neoplasm: Secondary | ICD-10-CM | POA: Insufficient documentation

## 2022-05-07 DIAGNOSIS — Z85118 Personal history of other malignant neoplasm of bronchus and lung: Secondary | ICD-10-CM | POA: Diagnosis present

## 2022-05-19 ENCOUNTER — Inpatient Hospital Stay: Payer: Federal, State, Local not specified - PPO | Attending: Oncology | Admitting: Oncology

## 2022-05-19 ENCOUNTER — Encounter: Payer: Self-pay | Admitting: Oncology

## 2022-05-19 ENCOUNTER — Inpatient Hospital Stay: Payer: Federal, State, Local not specified - PPO | Admitting: Oncology

## 2022-05-19 VITALS — BP 115/81 | HR 83 | Temp 96.2°F | Resp 18 | Ht 64.0 in | Wt 128.6 lb

## 2022-05-19 DIAGNOSIS — Z85118 Personal history of other malignant neoplasm of bronchus and lung: Secondary | ICD-10-CM | POA: Insufficient documentation

## 2022-05-19 DIAGNOSIS — Z08 Encounter for follow-up examination after completed treatment for malignant neoplasm: Secondary | ICD-10-CM

## 2022-05-19 DIAGNOSIS — Z87891 Personal history of nicotine dependence: Secondary | ICD-10-CM | POA: Diagnosis not present

## 2022-05-19 NOTE — Progress Notes (Signed)
Hematology/Oncology Consult note [facility]  [phone] [phone]  Patient Care Team: Eustaquio Boyden, MD as PCP - General (Family Medicine) Glory Buff, RN as Oncology Nurse Navigator   Name of the patient: Cassie Shaw  [id number]  February 12, 1945   Date of visit: 05/19/22  Diagnosis- history of lung cancer in 2021 s/p resection    Chief complaint/ Reason for visit- discuss ct scan results and further management  Heme/Onc history: Patient is a 77 year old female with history of irritable bowel syndrome, peripheral neuropathy, tobacco abuse among other medical problems.  She underwent a low-dose screening CT chest on October 2021 which showed a lung nodule in the left lower lobe 11.3 mm with a solid component of 6.6 mm.  There was improvement in the size of this nodule 3 months later.  A PET CT scan showed mild metabolic activity within the cavitary lesion in the left lower lobe.  No evidence of mediastinal or hilar adenopathy or distant metastatic disease.  Patient was seen by Dr. Dorris Fetch and underwent left lower lobe wedge resection on 03/15/2020.   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  No evidence of lymphovascular or visceral pleural invasion.  10 lymph nodes were negative for malignancy.  p T1b pN0    Interval history-she is doing well for her age.  She has baseline fatigue.  Denies any worsening shortness of breath.  ECOG PS- 1 Pain scale- 0   Review of systems- Review of Systems  Constitutional:  Negative for chills, fever, malaise/fatigue and weight loss.  HENT:  Negative for congestion, ear discharge and nosebleeds.   Eyes:  Negative for blurred vision.  Respiratory:  Negative for cough, hemoptysis, sputum production, shortness of breath and wheezing.   Cardiovascular:  Negative for chest pain, palpitations, orthopnea and claudication.  Gastrointestinal:  Negative for abdominal  pain, blood in stool, constipation, diarrhea, heartburn, melena, nausea and vomiting.  Genitourinary:  Negative for dysuria, flank pain, frequency, hematuria and urgency.  Musculoskeletal:  Negative for back pain, joint pain and myalgias.  Skin:  Negative for rash.  Neurological:  Negative for dizziness, tingling, focal weakness, seizures, weakness and headaches.  Endo/Heme/Allergies:  Does not bruise/bleed easily.  Psychiatric/Behavioral:  Negative for depression and suicidal ideas. The patient does not have insomnia.       No Known Allergies   Past Medical History:  Diagnosis Date   Arthritis    B12 deficiency    COPD (chronic obstructive pulmonary disease) (HCC)    Ex-smoker    Fibrocystic breast disease    Fibromyalgia    History of chicken pox    History of depression    and anxiety   HLD (hyperlipidemia)    IBS (irritable bowel syndrome)    Osteoporosis 2015   on/off fosamax DEXA T -3.5 spine, -2.5 hip   Peripheral neuropathy    Retinal detachment      Past Surgical History:  Procedure Laterality Date   CATARACT EXTRACTION Bilateral 2014   with lens implant   COLONOSCOPY  12/2005   WNL ([facility])   COLONOSCOPY  08/2017   5 TAs, rpt 5 yrs ([facility])   DEXA  01/2013   T -3.5 spine, -2.5 hip   INTERCOSTAL NERVE BLOCK Left 03/15/2020   Procedure: INTERCOSTAL NERVE BLOCK;  Surgeon: Loreli Slot, MD;  Location: [facility] OR;  Service: Thoracic;  Laterality: Left;   LYMPH NODE DISSECTION Bilateral 03/15/2020   Procedure: LYMPH NODE DISSECTION;  Surgeon:  Loreli Slot, MD;  Location: [facility] OR;  Service: Thoracic;  Laterality: Bilateral;   PARTIAL HYSTERECTOMY  1975   for heavy bleeding, ovaries remained   RETINAL DETACHMENT SURGERY  2014   Dr Luciana Axe   VIDEO ASSISTED THORACOSCOPY (VATS)/WEDGE RESECTION Left 02/2020   Robotic left video-assisted thoracoscopy, L lower lobe wedge resection, L lower lobectomy and LN dissection Dorris Fetch)    Social History    Socioeconomic History   Marital status: Married    Spouse name: Not on file   Number of children: Not on file   Years of education: Not on file   Highest education level: Not on file  Occupational History   Not on file  Tobacco Use   Smoking status: Former    Packs/day: 1.00    Years: 46.00    Additional pack years: 0.00    Total pack years: 46.00    Types: Cigarettes    Quit date: 01/21/2004    Years since quitting: 18.3    Passive exposure: Past   Smokeless tobacco: Never  Vaping Use   Vaping Use: Never used  Substance and Sexual Activity   Alcohol use: No    Alcohol/week: 0.0 standard drinks of alcohol   Drug use: No   Sexual activity: Not on file  Other Topics Concern   Not on file  Social History Narrative   Lives with husband [facility]), 2 dogs and a bird   Occupation: retired, was Education officer, environmental   Activity: no regular exercise   Diet: good water, fruits/vegetables daily   Social Determinants of Corporate investment banker Strain: Not on Ship broker Insecurity: Not on file  Transportation Needs: Not on file  Physical Activity: Not on file  Stress: Not on file  Social Connections: Not on file  Intimate Partner Violence: Not on file    Family History  Problem Relation Age of Onset   Arthritis Mother    Cancer Mother        smoker   Cancer Sister        bone   Diabetes Maternal Uncle    Cancer Maternal Grandfather    Stroke Neg Hx    CAD Neg Hx    Breast cancer Neg Hx      Current Outpatient Medications:    acetaminophen (TYLENOL) 325 MG tablet, Take 650 mg by mouth every 6 (six) hours as needed., Disp: , Rfl:    azithromycin (ZITHROMAX) 250 MG tablet, Take two tablets on day one followed by one tablet on days 2-5, Disp: 6 each, Rfl: 0   Cholecalciferol (VITAMIN D3) 1000 units CAPS, Take 1 capsule (1,000 Units total) by mouth daily., Disp: 30 capsule, Rfl:    denosumab (PROLIA) 60 MG/ML SOSY injection, Inject 60 mg into the skin every 6 (six) months.,  Disp: 1 mL, Rfl: 0   gabapentin (NEURONTIN) 600 MG tablet, Take 1 tablet (600 mg total) by mouth 2 (two) times daily., Disp: 180 tablet, Rfl: 3   guaiFENesin-codeine (CHERATUSSIN AC) 100-10 MG/5ML syrup, Take 5 mLs by mouth 2 (two) times daily as needed for cough (sedation precautions)., Disp: 120 mL, Rfl: 0   ibuprofen (ADVIL) 200 MG tablet, Take 200 mg by mouth 2 (two) times daily as needed for moderate pain., Disp: , Rfl:    ipratropium (ATROVENT) 0.06 % nasal spray, Place 2 sprays into both nostrils 4 (four) times daily., Disp: 15 mL, Rfl: 1   lovastatin (MEVACOR) 40 MG tablet, Take 1 tablet (40 mg total)  by mouth at bedtime., Disp: 90 tablet, Rfl: 3   melatonin 1 MG TABS tablet, Take 3 mg by mouth at bedtime., Disp: , Rfl:    Multiple Vitamin (MULTIVITAMIN) tablet, Take 1 tablet by mouth daily., Disp: , Rfl:    nortriptyline (PAMELOR) 25 MG capsule, Take 1 capsule (25 mg total) by mouth at bedtime. (Patient taking differently: Take 25 mg by mouth at bedtime as needed.), Disp: 90 capsule, Rfl: 3  Physical exam:  Vitals:   05/19/22 0818  BP: 115/81  Pulse: 83  Resp: 18  Temp: (!) 96.2 F (35.7 C)  TempSrc: Tympanic  SpO2: 100%  Weight: 128 lb 9.6 oz (58.3 kg)  Height: 5\' 4"  (1.626 m)   Physical Exam Cardiovascular:     Rate and Rhythm: Normal rate and regular rhythm.     Heart sounds: Normal heart sounds.  Pulmonary:     Effort: Pulmonary effort is normal.     Breath sounds: Normal breath sounds.  Abdominal:     General: Bowel sounds are normal.     Palpations: Abdomen is soft.  Skin:    General: Skin is warm and dry.  Neurological:     Mental Status: She is alert and oriented to person, place, and time.         Latest Ref Rng & Units 01/29/2022    9:41 AM  CMP  Glucose 70 - 99 mg/dL 97   BUN 6 - 23 mg/dL 9   Creatinine 4.09 - 8.11 mg/dL 9.14   Sodium 782 - 956 mEq/L 140   Potassium 3.5 - 5.1 mEq/L 4.5   Chloride 96 - 112 mEq/L 102   CO2 19 - 32 mEq/L 28   Calcium  8.4 - 10.5 mg/dL 9.4       Latest Ref Rng & Units 04/18/2020   12:13 PM  CBC  WBC 4.0 - 10.5 K/uL 7.0   Hemoglobin 12.0 - 15.0 g/dL 21.3   Hematocrit 08.6 - 46.0 % 39.0   Platelets 150.0 - 400.0 K/uL 214.0     No images are attached to the encounter.  CT Chest Wo Contrast  Result Date: 05/09/2022 CLINICAL DATA:  Lung cancer, diagnosed 2021, status post lung resection EXAM: CT CHEST WITHOUT CONTRAST TECHNIQUE: Multidetector CT imaging of the chest was performed following the standard protocol without IV contrast. RADIATION DOSE REDUCTION: This exam was performed according to the departmental dose-optimization program which includes automated exposure control, adjustment of the mA and/or kV according to patient size and/or use of iterative reconstruction technique. COMPARISON:  11/11/2021 FINDINGS: Cardiovascular: The heart is normal in size. No pericardial effusion. Leftward cardiomediastinal shift. No evidence of thoracic aortic aneurysm. Atherosclerotic calcifications of the arch. Mild coronary atherosclerosis of the LAD. Mediastinum/Nodes: No suspicious mediastinal lymphadenopathy. Visualized thyroid is unremarkable. Lungs/Pleura: Status post left lower lobectomy. Status post biapical pleural-parenchymal scarring. Mild subpleural reticulation/fibrosis in the bilateral lower lungs. No suspicious pulmonary nodules. No focal consolidation. No pleural effusion or pneumothorax. Upper Abdomen: Visualized upper abdomen is grossly unremarkable, noting vascular calcifications and a stable 2.1 cm simple left upper pole renal cyst. No follow-up is recommended. Musculoskeletal: Degenerative changes of the visualized thoracolumbar spine. Mild anterior wedging at T7, chronic. Mild superior endplate Schmorl's node deformity at T12, unchanged. Old left posterior rib fracture deformities. IMPRESSION: Status post left lower lobectomy. No evidence of recurrent or metastatic disease. Aortic Atherosclerosis  (ICD10-I70.0). Electronically Signed   By: Charline Bills M.D.   On: 05/09/2022 17:50  Assessment and plan- Patient is a 77 y.o. 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  This is a routine surveillance visit  I have reviewed CT chest images  independently and discussed findings with the patient.  This does not show any evidence of recurrent or progressive disease.  Patient had a lung cancer resection back in February 2022.  This is year 2 of surveillance.  We will continue to get her CT chest every 6 months for up to 3 years from the time of surgery and following that we will switch to yearly scans.  I will see her back in 6 months with scans prior   Visit Diagnosis 1. Encounter for follow-up surveillance of lung cancer      Dr. Owens Shark, MD, MPH [facility] [phone] 05/19/2022 8:36 AM

## 2022-08-04 ENCOUNTER — Other Ambulatory Visit: Payer: Self-pay | Admitting: Family Medicine

## 2022-08-25 ENCOUNTER — Telehealth: Payer: Self-pay | Admitting: Family Medicine

## 2022-08-25 NOTE — Telephone Encounter (Signed)
Patient receives prolia injection every 6 months.She is due now,she would like a call to get scheduled

## 2022-08-26 ENCOUNTER — Telehealth: Payer: Self-pay | Admitting: Family Medicine

## 2022-08-26 NOTE — Telephone Encounter (Signed)
Patient's husband contacted the office inquiring about prolia injection. Says patient's last injection was on 02/25/2022, he wanted to know when she is due for another and what they have to do to prepare to have this done. Please advise patient at home number, thanks

## 2022-08-26 NOTE — Telephone Encounter (Signed)
Please verify benefits for patient.

## 2022-08-28 ENCOUNTER — Telehealth: Payer: Self-pay

## 2022-08-28 NOTE — Telephone Encounter (Signed)
Created new encounter for Prolia BIV. Will route encounter back once benefit verification is complete.  

## 2022-08-28 NOTE — Telephone Encounter (Signed)
Prolia VOB initiated via [email or web address]  Last Prolia inj: 02/25/22 Next Prolia inj DUE: 08/25/22

## 2022-08-29 NOTE — Telephone Encounter (Signed)
Reviewed chart it is bing processed with pa team now. Called and spoke to patient she is aware we will contact one we have received information.

## 2022-09-01 ENCOUNTER — Other Ambulatory Visit (HOSPITAL_COMMUNITY): Payer: Self-pay

## 2022-09-01 NOTE — Telephone Encounter (Signed)
Pt ready for scheduling for PROLIA on or after : 09/01/22  Out-of-pocket cost due at time of visit: $260  Primary: [employer] Prolia co-insurance: 15% Admin fee co-insurance: 15%  Secondary: --- Prolia co-insurance:  Admin fee co-insurance:   Medical Benefit Details: Date Benefits were checked: 09/01/22 Deductible: $350 MET OF $350 REQUIRED/ Coinsurance: 15%/ Admin Fee: 15%  Prior Auth: APPROVED PA# [id number] Expiration Date: 01/15/22-02/14/23  # of doses approved:  Pharmacy benefit: Copay $490 If patient wants fill through the pharmacy benefit please send prescription to: [facility], and include estimated need by date in rx notes. Pharmacy will ship medication directly to the office.  Patient NOT eligible for Prolia Copay Card. Copay Card can make patient's cost as little as $25. Link to apply: [email or web address]://[email or web address].[email or web address]  ** This summary of benefits is an estimation of the patient's out-of-pocket cost. Exact cost may very based on individual plan coverage.

## 2022-09-01 NOTE — Telephone Encounter (Signed)
Checked on still holding for cost for patient.

## 2022-09-11 NOTE — Telephone Encounter (Signed)
Patient returned call regarding prolia ,would like a call back.

## 2022-09-11 NOTE — Telephone Encounter (Signed)
Left message to return call to our office.  

## 2022-09-11 NOTE — Telephone Encounter (Signed)
See open message about scheduling. This is duplicate and will close at this time.

## 2022-09-12 NOTE — Telephone Encounter (Signed)
Pt returned call would like a call back #[phone]

## 2022-09-17 ENCOUNTER — Encounter: Payer: Self-pay | Admitting: Family Medicine

## 2022-09-17 ENCOUNTER — Ambulatory Visit: Payer: Federal, State, Local not specified - PPO | Admitting: Family Medicine

## 2022-09-17 VITALS — BP 118/70 | HR 82 | Temp 97.7°F | Ht 64.0 in | Wt 123.8 lb

## 2022-09-17 DIAGNOSIS — Z23 Encounter for immunization: Secondary | ICD-10-CM

## 2022-09-17 DIAGNOSIS — M810 Age-related osteoporosis without current pathological fracture: Secondary | ICD-10-CM | POA: Diagnosis not present

## 2022-09-17 DIAGNOSIS — R208 Other disturbances of skin sensation: Secondary | ICD-10-CM

## 2022-09-17 LAB — BASIC METABOLIC PANEL
BUN: 11 mg/dL (ref 6–23)
CO2: 30 mEq/L (ref 19–32)
Calcium: 9.7 mg/dL (ref 8.4–10.5)
Chloride: 101 mEq/L (ref 96–112)
Creatinine, Ser: 0.75 mg/dL (ref 0.40–1.20)
GFR: 76.35 mL/min (ref >60.00)
Glucose, Bld: 97 mg/dL (ref 70–99)
Potassium: 4.6 mEq/L (ref 3.5–5.1)
Sodium: 139 mEq/L (ref 135–145)

## 2022-09-17 MED ORDER — GABAPENTIN 600 MG PO TABS: 600.0000 mg | ORAL_TABLET | Freq: Two times a day (BID) | ORAL | 1 refills | Status: DC

## 2022-09-17 NOTE — Progress Notes (Signed)
Ph: [phone] Fax: [phone]   Patient ID: Cassie Shaw, female    DOB: [birth year], 77 y.o.   MRN: [id number]  This visit was conducted in person.  BP 118/70   Pulse 82   Temp 97.7 F (36.5 C) (Temporal)   Ht 5\' 4"  (1.626 m)   Wt 123 lb 12.8 oz (56.2 kg)   SpO2 98%   BMI 21.25 kg/m    CC: discuss meds  Subjective:   HPI: Cassie Shaw is a 77 y.o. 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 Prolia restarted this year, latest injection 02/25/2022. She desires to receive one final injection however plans to start fosamax after this. Reviewed monitoring for atypical hip fractures.   Husband Roger Shelter has started receiving most care through the [place].   Chronic foot burning pain. Acupuncture has helped. She's been using less gabapentin because of this.      Relevant past medical, surgical, family and social history reviewed and updated as indicated. Interim medical history since our last visit reviewed. Allergies and medications reviewed and updated. Outpatient Medications Prior to Visit  Medication Sig Dispense Refill   acetaminophen (TYLENOL) 325 MG tablet Take 650 mg by mouth every 6 (six) hours as needed.     azithromycin (ZITHROMAX) 250 MG tablet Take two tablets on day one followed by one tablet on days 2-5 6 each 0   Cholecalciferol (VITAMIN D3) 1000 units CAPS Take 1 capsule (1,000 Units total) by mouth daily. 30 capsule    denosumab (PROLIA) 60 MG/ML SOSY injection Inject 60 mg into the skin every 6 (six) months. 1 mL 0   guaiFENesin-codeine (CHERATUSSIN AC) 100-10 MG/5ML syrup Take 5 mLs by mouth 2 (two) times daily as needed for cough (sedation precautions). 120 mL 0   ibuprofen (ADVIL) 200 MG tablet Take 200 mg by mouth 2 (two) times daily as needed for moderate pain.     ipratropium (ATROVENT) 0.06 % nasal  spray Place 2 sprays into both nostrils 4 (four) times daily. 15 mL 1   lovastatin (MEVACOR) 40 MG tablet Take 1 tablet (40 mg total) by mouth at bedtime. 90 tablet 3   melatonin 1 MG TABS tablet Take 3 mg by mouth at bedtime.     Multiple Vitamin (MULTIVITAMIN) tablet Take 1 tablet by mouth daily.     nortriptyline (PAMELOR) 25 MG capsule Take 1 capsule (25 mg total) by mouth at bedtime. (Patient taking differently: Take 25 mg by mouth at bedtime as needed.) 90 capsule 3   gabapentin (NEURONTIN) 600 MG tablet Take 1 tablet (600 mg total) by mouth 2 (two) times daily. 180 tablet 3   No facility-administered medications prior to visit.     Per HPI unless specifically indicated in ROS section below Review of Systems  Objective:  BP 118/70   Pulse 82   Temp 97.7 F (36.5 C) (Temporal)   Ht 5\' 4"  (1.626 m)   Wt 123 lb 12.8 oz (56.2 kg)   SpO2 98%   BMI 21.25 kg/m   Wt Readings from Last 3 Encounters:  09/17/22 123 lb 12.8 oz (56.2 kg)  05/19/22 128 lb 9.6 oz (58.3 kg)  01/29/22 128 lb 8 oz (58.3 kg)      Physical Exam Vitals and nursing note reviewed.  Constitutional:  Appearance: Normal appearance. She is not ill-appearing.  HENT:     Head: Normocephalic and atraumatic.     Mouth/Throat:     Mouth: Mucous membranes are moist.     Pharynx: Oropharynx is clear. No oropharyngeal exudate or posterior oropharyngeal erythema.  Eyes:     Extraocular Movements: Extraocular movements intact.     Pupils: Pupils are equal, round, and reactive to light.  Neck:     Thyroid: No thyroid mass or thyromegaly.  Cardiovascular:     Rate and Rhythm: Normal rate and regular rhythm.     Pulses: Normal pulses.     Heart sounds: Normal heart sounds. No murmur heard. Pulmonary:     Effort: Pulmonary effort is normal. No respiratory distress.     Breath sounds: Normal breath sounds. No wheezing, rhonchi or rales.  Musculoskeletal:     Cervical back: Normal range of motion and neck supple.      Right lower leg: No edema.     Left lower leg: No edema.  Skin:    General: Skin is warm and dry.     Findings: No rash.  Neurological:     Mental Status: She is alert.  Psychiatric:        Mood and Affect: Mood normal.        Behavior: Behavior normal.       Results for orders placed or performed in visit on 09/17/22  Basic metabolic panel  Result Value Ref Range   Sodium 139 135 - 145 mEq/L   Potassium 4.6 3.5 - 5.1 mEq/L   Chloride 101 96 - 112 mEq/L   CO2 30 19 - 32 mEq/L   Glucose, Bld 97 70 - 99 mg/dL   BUN 11 6 - 23 mg/dL   Creatinine, Ser 1.61 0.40 - 1.20 mg/dL   GFR 09.60 >45.40 mL/min   Calcium 9.7 8.4 - 10.5 mg/dL    Assessment & Plan:   Problem List Items Addressed This Visit     Burning sensation of foot    Chronic, normally managed with gabapentin 600mg  BID.  Seems better after recent acupuncture, notes has needed less gabapentin after this.      Osteoporosis - Primary    Will proceed with scheduling nurse visit for next prolia injection (already due). Reviewed estimated cost to patient as per phone note in chart. She plans to transition to fosamax in 6 months when prolia effect wears off. Update BMP prior to prolia shot      Relevant Orders   Basic metabolic panel (Completed)   Other Visit Diagnoses     Need for influenza vaccination       Relevant Orders   Flu Vaccine Trivalent High Dose (Fluad) (Completed)        Meds ordered this encounter  Medications   gabapentin (NEURONTIN) 600 MG tablet    Sig: Take 1 tablet (600 mg total) by mouth 2 (two) times daily.    Dispense:  180 tablet    Refill:  1    Orders Placed This Encounter  Procedures   Flu Vaccine Trivalent High Dose (Fluad)   Basic metabolic panel    Patient Instructions  Labs today I will forward message to Joelle to call you and schedule nurse visit for one final prolia shot, then in 6 months we can restart fosamax weekly pill.   Follow up plan: Return in about 2  months (around 11/17/2022), or if symptoms worsen or fail to improve.  Eustaquio Boyden, MD

## 2022-09-17 NOTE — Patient Instructions (Addendum)
Labs today I will forward message to Cassie Shaw to call you and schedule nurse visit for one final prolia shot, then in 6 months we can restart fosamax weekly pill.

## 2022-09-19 ENCOUNTER — Encounter: Payer: Self-pay | Admitting: Family Medicine

## 2022-09-19 NOTE — Assessment & Plan Note (Signed)
Chronic, normally managed with gabapentin 600mg  BID.  Seems better after recent acupuncture, notes has needed less gabapentin after this.

## 2022-09-19 NOTE — Assessment & Plan Note (Addendum)
Will proceed with scheduling nurse visit for next prolia injection (already due). Reviewed estimated cost to patient as per phone note in chart. She plans to transition to fosamax in 6 months when prolia effect wears off. Update BMP prior to prolia shot

## 2022-09-19 NOTE — Telephone Encounter (Addendum)
Seen in office this week. Pt requests phone call to schedule prolia shot. Thanks

## 2022-09-24 ENCOUNTER — Encounter: Payer: Self-pay | Admitting: Family Medicine

## 2022-09-24 NOTE — Telephone Encounter (Signed)
Patients husband called in again regarding status of her getting scheduled for prolia,or to see what's going on with it. He said that he keeps getting told that someone will call him to let him know what's going on,and no one has called.

## 2022-09-24 NOTE — Telephone Encounter (Signed)
Patient and her husband has called several times to see what's going on with herr getting scheduled for her prolia injection. They have not received a phone call yet,and she wold like to get set up for the injection.

## 2022-09-25 ENCOUNTER — Ambulatory Visit (INDEPENDENT_AMBULATORY_CARE_PROVIDER_SITE_OTHER): Payer: Federal, State, Local not specified - PPO | Admitting: *Deleted

## 2022-09-25 DIAGNOSIS — M810 Age-related osteoporosis without current pathological fracture: Secondary | ICD-10-CM

## 2022-09-25 MED ORDER — DENOSUMAB 60 MG/ML ~~LOC~~ SOSY
60.0000 mg | PREFILLED_SYRINGE | Freq: Once | SUBCUTANEOUS | Status: AC
Start: 2022-09-25 — End: 2022-09-25
  Administered 2022-09-25: 60 mg via SUBCUTANEOUS

## 2022-09-25 NOTE — Progress Notes (Signed)
Per orders of Dr. Kerby Nora (PCP out of the office), injection of Prolia  given by Blenda Mounts M in left arm. Patient tolerated injection well. Patient will make appointment for 6 month.

## 2022-09-25 NOTE — Telephone Encounter (Signed)
Called patient set up injection for today patient aware of copay

## 2022-09-25 NOTE — Telephone Encounter (Signed)
Left message to return call to our office.  

## 2022-09-25 NOTE — Telephone Encounter (Signed)
Called patient set up for today. No questions at this time.

## 2022-10-29 ENCOUNTER — Other Ambulatory Visit: Payer: Self-pay | Admitting: Family Medicine

## 2022-10-29 DIAGNOSIS — E785 Hyperlipidemia, unspecified: Secondary | ICD-10-CM

## 2022-10-31 NOTE — Telephone Encounter (Signed)
ERx 

## 2022-11-06 ENCOUNTER — Other Ambulatory Visit: Payer: Self-pay | Admitting: Family Medicine

## 2022-11-06 DIAGNOSIS — E785 Hyperlipidemia, unspecified: Secondary | ICD-10-CM

## 2022-11-06 DIAGNOSIS — E538 Deficiency of other specified B group vitamins: Secondary | ICD-10-CM

## 2022-11-06 DIAGNOSIS — R7989 Other specified abnormal findings of blood chemistry: Secondary | ICD-10-CM

## 2022-11-06 DIAGNOSIS — R748 Abnormal levels of other serum enzymes: Secondary | ICD-10-CM

## 2022-11-06 DIAGNOSIS — M810 Age-related osteoporosis without current pathological fracture: Secondary | ICD-10-CM

## 2022-11-10 ENCOUNTER — Telehealth: Payer: Self-pay | Admitting: Family Medicine

## 2022-11-10 ENCOUNTER — Other Ambulatory Visit (INDEPENDENT_AMBULATORY_CARE_PROVIDER_SITE_OTHER): Payer: Federal, State, Local not specified - PPO

## 2022-11-10 DIAGNOSIS — R748 Abnormal levels of other serum enzymes: Secondary | ICD-10-CM

## 2022-11-10 DIAGNOSIS — E785 Hyperlipidemia, unspecified: Secondary | ICD-10-CM

## 2022-11-10 DIAGNOSIS — E538 Deficiency of other specified B group vitamins: Secondary | ICD-10-CM | POA: Diagnosis not present

## 2022-11-10 DIAGNOSIS — M810 Age-related osteoporosis without current pathological fracture: Secondary | ICD-10-CM

## 2022-11-10 DIAGNOSIS — R7989 Other specified abnormal findings of blood chemistry: Secondary | ICD-10-CM

## 2022-11-10 DIAGNOSIS — Z08 Encounter for follow-up examination after completed treatment for malignant neoplasm: Secondary | ICD-10-CM

## 2022-11-10 LAB — COMPREHENSIVE METABOLIC PANEL
ALT: 16 U/L (ref 0–35)
AST: 21 U/L (ref 0–37)
Albumin: 4.3 g/dL (ref 3.5–5.2)
Alkaline Phosphatase: 27 U/L — ABNORMAL LOW (ref 39–117)
BUN: 13 mg/dL (ref 6–23)
CO2: 30 meq/L (ref 19–32)
Calcium: 9.4 mg/dL (ref 8.4–10.5)
Chloride: 102 meq/L (ref 96–112)
Creatinine, Ser: 0.83 mg/dL (ref 0.40–1.20)
GFR: 67.54 mL/min (ref >60.00)
Glucose, Bld: 98 mg/dL (ref 70–99)
Potassium: 5.5 meq/L — ABNORMAL HIGH (ref 3.5–5.1)
Sodium: 139 meq/L (ref 135–145)
Total Bilirubin: 0.7 mg/dL (ref 0.2–1.2)
Total Protein: 7.7 g/dL (ref 6.0–8.3)

## 2022-11-10 LAB — CBC WITH DIFFERENTIAL/PLATELET
Basophils Absolute: 0 10*3/uL (ref 0.0–0.1)
Basophils Relative: 0.6 % (ref 0.0–3.0)
Eosinophils Absolute: 0.2 10*3/uL (ref 0.0–0.7)
Eosinophils Relative: 2.9 % (ref 0.0–5.0)
HCT: 42.9 % (ref 36.0–46.0)
Hemoglobin: 13.7 g/dL (ref 12.0–15.0)
Lymphocytes Relative: 34 % (ref 12.0–46.0)
Lymphs Abs: 2.1 10*3/uL (ref 0.7–4.0)
MCHC: 32 g/dL (ref 30.0–36.0)
MCV: 90.5 fL (ref 78.0–100.0)
Monocytes Absolute: 0.5 10*3/uL (ref 0.1–1.0)
Monocytes Relative: 8.9 % (ref 3.0–12.0)
Neutro Abs: 3.3 10*3/uL (ref 1.4–7.7)
Neutrophils Relative %: 53.6 % (ref 43.0–77.0)
Platelets: 226 10*3/uL (ref 150.0–400.0)
RBC: 4.74 Mil/uL (ref 3.87–5.11)
RDW: 13.1 % (ref 11.5–15.5)
WBC: 6.2 10*3/uL (ref 4.0–10.5)

## 2022-11-10 LAB — LIPID PANEL
Cholesterol: 192 mg/dL (ref 0–200)
HDL: 61.5 mg/dL (ref >39.00)
LDL Cholesterol: 95 mg/dL (ref 0–99)
NonHDL: 130.58
Total CHOL/HDL Ratio: 3
Triglycerides: 178 mg/dL — ABNORMAL HIGH (ref 0.0–149.0)
VLDL: 35.6 mg/dL (ref 0.0–40.0)

## 2022-11-10 LAB — VITAMIN B12: Vitamin B-12: 761 pg/mL (ref 211–911)

## 2022-11-10 LAB — VITAMIN D 25 HYDROXY (VIT D DEFICIENCY, FRACTURES): VITD: 48.45 ng/mL (ref 30.00–100.00)

## 2022-11-10 LAB — TSH: TSH: 1.93 u[IU]/mL (ref 0.35–5.50)

## 2022-11-10 NOTE — Telephone Encounter (Signed)
Will forward to [facility].  My latest note was printed and attached to letter.  If insurance will not cover, we may have to front the bill as we told pt it would be $260 and now she's being charged $2830.

## 2022-11-10 NOTE — Telephone Encounter (Signed)
Pt's husband, Roger Shelter, brought by a [employer] ppw for pt for Dr. Reece Agar to review & comp. Ppw is in Dr. Timoteo Expose folder. Call back # [phone]

## 2022-11-10 NOTE — Telephone Encounter (Signed)
Placed in providers ox for review. I can see it is about a prolia injection but we have documentation where benefits were ran. I am not sure if it is due to being done under another provider?

## 2022-11-11 NOTE — Telephone Encounter (Signed)
Letter of medical necessity emailed to billing to resubmit

## 2022-11-11 NOTE — Telephone Encounter (Addendum)
Letter of medical necessity written and returned to Amy.

## 2022-11-17 ENCOUNTER — Ambulatory Visit (INDEPENDENT_AMBULATORY_CARE_PROVIDER_SITE_OTHER): Payer: Federal, State, Local not specified - PPO | Admitting: Family Medicine

## 2022-11-17 ENCOUNTER — Encounter: Payer: Self-pay | Admitting: Family Medicine

## 2022-11-17 VITALS — BP 118/82 | HR 90 | Temp 97.8°F | Ht 63.0 in | Wt 122.0 lb

## 2022-11-17 DIAGNOSIS — Z87891 Personal history of nicotine dependence: Secondary | ICD-10-CM

## 2022-11-17 DIAGNOSIS — R208 Other disturbances of skin sensation: Secondary | ICD-10-CM

## 2022-11-17 DIAGNOSIS — E875 Hyperkalemia: Secondary | ICD-10-CM | POA: Diagnosis not present

## 2022-11-17 DIAGNOSIS — M810 Age-related osteoporosis without current pathological fracture: Secondary | ICD-10-CM

## 2022-11-17 DIAGNOSIS — Z0000 Encounter for general adult medical examination without abnormal findings: Secondary | ICD-10-CM

## 2022-11-17 DIAGNOSIS — J449 Chronic obstructive pulmonary disease, unspecified: Secondary | ICD-10-CM

## 2022-11-17 DIAGNOSIS — K581 Irritable bowel syndrome with constipation: Secondary | ICD-10-CM | POA: Diagnosis not present

## 2022-11-17 DIAGNOSIS — Z7189 Other specified counseling: Secondary | ICD-10-CM

## 2022-11-17 DIAGNOSIS — E785 Hyperlipidemia, unspecified: Secondary | ICD-10-CM

## 2022-11-17 DIAGNOSIS — I700 Atherosclerosis of aorta: Secondary | ICD-10-CM

## 2022-11-17 DIAGNOSIS — K5909 Other constipation: Secondary | ICD-10-CM

## 2022-11-17 DIAGNOSIS — Z85118 Personal history of other malignant neoplasm of bronchus and lung: Secondary | ICD-10-CM

## 2022-11-17 DIAGNOSIS — E538 Deficiency of other specified B group vitamins: Secondary | ICD-10-CM

## 2022-11-17 DIAGNOSIS — M797 Fibromyalgia: Secondary | ICD-10-CM

## 2022-11-17 LAB — BASIC METABOLIC PANEL
BUN: 8 mg/dL (ref 6–23)
CO2: 28 meq/L (ref 19–32)
Calcium: 9.8 mg/dL (ref 8.4–10.5)
Chloride: 102 meq/L (ref 96–112)
Creatinine, Ser: 0.76 mg/dL (ref 0.40–1.20)
GFR: 75.06 mL/min (ref >60.00)
Glucose, Bld: 100 mg/dL — ABNORMAL HIGH (ref 70–99)
Potassium: 4.6 meq/L (ref 3.5–5.1)
Sodium: 139 meq/L (ref 135–145)

## 2022-11-17 MED ORDER — LOVASTATIN 40 MG PO TABS: 40.0000 mg | ORAL_TABLET | Freq: Every day | ORAL | 4 refills | Status: DC

## 2022-11-17 MED ORDER — NORTRIPTYLINE HCL 25 MG PO CAPS: 25.0000 mg | ORAL_CAPSULE | Freq: Every day | ORAL | 4 refills | Status: DC

## 2022-11-17 MED ORDER — GABAPENTIN 600 MG PO TABS: ORAL_TABLET | ORAL | 4 refills | Status: DC

## 2022-11-17 MED ORDER — GABAPENTIN 600 MG PO TABS: 600.0000 mg | ORAL_TABLET | Freq: Two times a day (BID) | ORAL | 4 refills | Status: DC

## 2022-11-17 NOTE — Assessment & Plan Note (Addendum)
Latest prolia 09/25/2022 - last dose.  Planning to start oral bisphosphonate fosamax in the spring.

## 2022-11-17 NOTE — Assessment & Plan Note (Signed)
Advanced directive discussion - would want daughter Cassie Shaw to be HCPOA. Ok with temporary measure, doesn't want prolonged life support if terminal condition. Packet previously provided.

## 2022-11-17 NOTE — Assessment & Plan Note (Signed)
Possibly due to high potassium diet.  Update levels today with lower dietary potassium intake.

## 2022-11-17 NOTE — Assessment & Plan Note (Addendum)
Levels have normalized off replacement - will remain off this.

## 2022-11-17 NOTE — Assessment & Plan Note (Signed)
Stable period off respiratory medication. Ex smoker.

## 2022-11-17 NOTE — Assessment & Plan Note (Signed)
Continue low potency statin.

## 2022-11-17 NOTE — Assessment & Plan Note (Signed)
Chronic, stable on lovastatin - continue. The 10-year ASCVD risk score (Arnett DK, et al., 2019) is: 19.2%   Values used to calculate the score:     Age: 77 years     Sex: Female     Is Non-Hispanic African American: No     Diabetic: No     Tobacco smoker: No     Systolic Blood Pressure: 118 mmHg     Is BP treated: No     HDL Cholesterol: 61.5 mg/dL     Total Cholesterol: 192 mg/dL

## 2022-11-17 NOTE — Assessment & Plan Note (Addendum)
Linzess ineffective. She continues miralax, stool softener, activia. Discussed dietary fiber intake, good water intake.  Rec 1/2 capful miralax daily, hold for loose stools.

## 2022-11-17 NOTE — Assessment & Plan Note (Addendum)
Longstanding hx, manages with [place].

## 2022-11-17 NOTE — Patient Instructions (Addendum)
Recheck potassium levels today.  Come March 2025 we will start fosamax weekly pill.  Call to schedule mammogram at your convenience: [facility] [phone] For constipation - increase fiber in the diet, take 1/2 capful of miralax daily.  Bring [place] a copy of your living will to update your chart.  Good to see you today Return as needed or in 6 months for follow up visit.

## 2022-11-17 NOTE — Assessment & Plan Note (Signed)
Preventative protocols reviewed and updated unless pt declined. Discussed healthy diet and lifestyle.  

## 2022-11-17 NOTE — Progress Notes (Signed)
Ph: [phone] Fax: [phone]   Patient ID: Duanne Limerick, female    DOB: Apr 22, 1945, 77 y.o.   MRN: [id number]  This visit was conducted in person.  BP 118/82   Pulse 90   Temp 97.8 F (36.6 C) (Oral)   Ht 5\' 3"  (1.6 m)   Wt 122 lb (55.3 kg)   SpO2 98%   BMI 21.61 kg/m    CC: CPE Subjective:   HPI: MONAY SAXER is a 77 y.o. female presenting on 11/17/2022 for Annual Exam   Did not see health advisor this year.  Has [employer], no medicare.   Hearing Screening   500Hz  1000Hz  2000Hz  4000Hz   Right ear 0 40 40 40  Left ear 0 0 25 0  Comments: C/o occasional buzzing in B ears.   Vision Screening   Right eye Left eye Both eyes  Without correction 20/25 20/30 20/30   With correction     She denies hearing difficulty but noes increased ringing for several weeks.  She declines audiology referral.  Flowsheet Row Office Visit from 11/17/2022 in [facility] Total Score 0          11/17/2022   12:22 PM 09/17/2022   10:08 AM 10/29/2021    3:26 PM  Fall Risk   Falls in the past year? 1 1 0  Number falls in past yr: 1 1 0  Injury with Fall? 0 0 0  Risk for fall due to :  History of fall(s) No Fall Risks  Follow up  Falls evaluation completed Falls evaluation completed   H/o lung cancer s/p full excision, did not need adjuvant treatment or XRT.  VIDEO ASSISTED THORACOSCOPY (VATS)/WEDGE RESECTION LEFT 02/2020 - Robotic left video-assisted thoracoscopy, L lower lobe wedge resection, L lower lobectomy and LN dissection Dorris Fetch) Continues seeing oncology yearly (Rao/Lauren Freida Busman).    Notes frequent constipation as per below.   Recent potassium was high - she doesn't take extra potassium supplements but has had high potassium diet.   Preventative: COLONOSCOPY 08/2017 - 5 TAs, rpt 5 yrs ([facility]) - saw GI, rec against repeat colonoscopy.  Well woman exam - s/p partial hysterectomy 1975 for heavy bleeding, ovaries  remain. No pelvic pain or vaginal bleeding.  Mammogram 10/2021 [facility] @ [facility] -# provided to call for appt  Lung cancer screening - see above, s/p treatment for stage Ia lung cancer.  DEXA 01/2013 T -3.5 spine, -2.5 hip  DEXA 03/2019 T -3.4 spine, -3.4 hip, -3.9 forearm - fosamax started, however doesn't remember to take regularly.  DEXA 11/2021 - T -3.6 LFN, -4.4 forearm. Changed to prolia this year with injections on 02/25/2022 and 09/25/2022. Insurance denied both charges, in process of resubmission. She has decided to return to weekly fosamax - will restart this in 03/2022.  Flu shot - yearly COVID vaccine Pfizer 02/2019, 03/2019, booster 10/2019 Td 2007 Prevnar-13 - 06/2017 Pneumovax - 03/2020 Shingrix - 06/2017, 08/2017 Advanced directive discussion - would want daughter Marchelle Gearing to be HCPOA. Ok with temporary measure, doesn't want prolonged life support if terminal condition. Packet previously provided.  Seat belt use discussed Sunscreen use discussed. No changing moles on skin.  Smoking - ex smoker quit remotely  Alcohol  - rare Dentist - yearly - has partial upper and lower dentures Eye exam in [place] - not recently  Bladder - no incontinence  Bowel - chronic constipation managed with activia and miralax and laxatives PRN. Linzess  was expensive and didn't really help.   Lives with husband [facility]), 2 dogs and a bird Occupation: retired, was Education officer, environmental  Activity: regular treadmill use - walks 1 mi/day Diet: good water, fruits/vegetables daily     Relevant past medical, surgical, family and social history reviewed and updated as indicated. Interim medical history since our last visit reviewed. Allergies and medications reviewed and updated. Outpatient Medications Prior to Visit  Medication Sig Dispense Refill   acetaminophen (TYLENOL) 325 MG tablet Take 650 mg by mouth every 6 (six) hours as needed.     Cholecalciferol (VITAMIN D3) 1000 units CAPS Take 1 capsule (1,000 Units total)  by mouth daily. 30 capsule    denosumab (PROLIA) 60 MG/ML SOSY injection Inject 60 mg into the skin every 6 (six) months. 1 mL 0   ibuprofen (ADVIL) 200 MG tablet Take 200 mg by mouth 2 (two) times daily as needed for moderate pain.     ipratropium (ATROVENT) 0.06 % nasal spray Place 2 sprays into both nostrils 4 (four) times daily. 15 mL 1   melatonin 1 MG TABS tablet Take 3 mg by mouth at bedtime.     Multiple Vitamin (MULTIVITAMIN) tablet Take 1 tablet by mouth daily.     polyethylene glycol powder (GLYCOLAX/MIRALAX) 17 GM/SCOOP powder Take 8.5 g by mouth daily.     gabapentin (NEURONTIN) 600 MG tablet Take 1 tablet (600 mg total) by mouth 2 (two) times daily. 180 tablet 1   lovastatin (MEVACOR) 40 MG tablet TAKE 1 TABLET BY MOUTH EVERYDAY AT BEDTIME 90 tablet 0   nortriptyline (PAMELOR) 25 MG capsule Take 1 capsule (25 mg total) by mouth at bedtime. (Patient taking differently: Take 25 mg by mouth at bedtime as needed.) 90 capsule 3   azithromycin (ZITHROMAX) 250 MG tablet Take two tablets on day one followed by one tablet on days 2-5 6 each 0   guaiFENesin-codeine (CHERATUSSIN AC) 100-10 MG/5ML syrup Take 5 mLs by mouth 2 (two) times daily as needed for cough (sedation precautions). 120 mL 0   No facility-administered medications prior to visit.     Per HPI unless specifically indicated in ROS section below Review of Systems  Constitutional:  Negative for activity change, appetite change, chills, fatigue, fever and unexpected weight change.  HENT:  Negative for hearing loss.   Eyes:  Negative for visual disturbance.  Respiratory:  Negative for cough, chest tightness, shortness of breath and wheezing.   Cardiovascular:  Positive for leg swelling (burning feet). Negative for chest pain and palpitations.  Gastrointestinal:  Positive for constipation. Negative for abdominal distention, abdominal pain, blood in stool, diarrhea, nausea and vomiting.  Genitourinary:  Negative for difficulty  urinating and hematuria.  Musculoskeletal:  Negative for arthralgias, myalgias and neck pain.  Skin:  Negative for rash.  Neurological:  Negative for dizziness, seizures, syncope and headaches.  Hematological:  Negative for adenopathy. Does not bruise/bleed easily.  Psychiatric/Behavioral:  Negative for dysphoric mood. The patient is not nervous/anxious.     Objective:  BP 118/82   Pulse 90   Temp 97.8 F (36.6 C) (Oral)   Ht 5\' 3"  (1.6 m)   Wt 122 lb (55.3 kg)   SpO2 98%   BMI 21.61 kg/m   Wt Readings from Last 3 Encounters:  11/17/22 122 lb (55.3 kg)  09/17/22 123 lb 12.8 oz (56.2 kg)  05/19/22 128 lb 9.6 oz (58.3 kg)      Physical Exam Vitals and nursing note reviewed.  Constitutional:  Appearance: Normal appearance. She is not ill-appearing.  HENT:     Head: Normocephalic and atraumatic.     Right Ear: Tympanic membrane, ear canal and external ear normal. There is no impacted cerumen.     Left Ear: Tympanic membrane, ear canal and external ear normal. There is no impacted cerumen.  Eyes:     General:        Right eye: No discharge.        Left eye: No discharge.     Extraocular Movements: Extraocular movements intact.     Conjunctiva/sclera: Conjunctivae normal.     Pupils: Pupils are equal, round, and reactive to light.  Neck:     Thyroid: No thyroid mass or thyromegaly.  Cardiovascular:     Rate and Rhythm: Normal rate and regular rhythm.     Pulses: Normal pulses.     Heart sounds: Normal heart sounds. No murmur heard. Pulmonary:     Effort: Pulmonary effort is normal. No respiratory distress.     Breath sounds: Normal breath sounds. No wheezing, rhonchi or rales.  Abdominal:     General: Bowel sounds are normal. There is no distension.     Palpations: Abdomen is soft. There is no mass.     Tenderness: There is no abdominal tenderness. There is no guarding or rebound.     Hernia: No hernia is present.  Musculoskeletal:     Cervical back: Normal range  of motion and neck supple. No rigidity.     Right lower leg: No edema.     Left lower leg: No edema.  Lymphadenopathy:     Cervical: No cervical adenopathy.  Skin:    General: Skin is warm and dry.     Findings: No rash.  Neurological:     General: No focal deficit present.     Mental Status: She is alert. Mental status is at baseline.  Psychiatric:        Mood and Affect: Mood normal.        Behavior: Behavior normal.       Results for orders placed or performed in visit on 11/10/22  CBC with Differential/Platelet  Result Value Ref Range   WBC 6.2 4.0 - 10.5 K/uL   RBC 4.74 3.87 - 5.11 Mil/uL   Hemoglobin 13.7 12.0 - 15.0 g/dL   HCT 04.5 40.9 - 81.1 %   MCV 90.5 78.0 - 100.0 fl   MCHC 32.0 30.0 - 36.0 g/dL   RDW 91.4 78.2 - 95.6 %   Platelets 226.0 150.0 - 400.0 K/uL   Neutrophils Relative % 53.6 43.0 - 77.0 %   Lymphocytes Relative 34.0 12.0 - 46.0 %   Monocytes Relative 8.9 3.0 - 12.0 %   Eosinophils Relative 2.9 0.0 - 5.0 %   Basophils Relative 0.6 0.0 - 3.0 %   Neutro Abs 3.3 1.4 - 7.7 K/uL   Lymphs Abs 2.1 0.7 - 4.0 K/uL   Monocytes Absolute 0.5 0.1 - 1.0 K/uL   Eosinophils Absolute 0.2 0.0 - 0.7 K/uL   Basophils Absolute 0.0 0.0 - 0.1 K/uL  TSH  Result Value Ref Range   TSH 1.93 0.35 - 5.50 uIU/mL  VITAMIN D 25 Hydroxy (Vit-D Deficiency, Fractures)  Result Value Ref Range   VITD 48.45 30.00 - 100.00 ng/mL  Vitamin B12  Result Value Ref Range   Vitamin B-12 761 211 - 911 pg/mL  Comprehensive metabolic panel  Result Value Ref Range   Sodium 139 135 - 145 mEq/L  Potassium 5.5 No hemolysis seen (H) 3.5 - 5.1 mEq/L   Chloride 102 96 - 112 mEq/L   CO2 30 19 - 32 mEq/L   Glucose, Bld 98 70 - 99 mg/dL   BUN 13 6 - 23 mg/dL   Creatinine, Ser 1.61 0.40 - 1.20 mg/dL   Total Bilirubin 0.7 0.2 - 1.2 mg/dL   Alkaline Phosphatase 27 (L) 39 - 117 U/L   AST 21 0 - 37 U/L   ALT 16 0 - 35 U/L   Total Protein 7.7 6.0 - 8.3 g/dL   Albumin 4.3 3.5 - 5.2 g/dL   GFR  09.60 >45.40 mL/min   Calcium 9.4 8.4 - 10.5 mg/dL  Lipid panel  Result Value Ref Range   Cholesterol 192 0 - 200 mg/dL   Triglycerides 981.1 (H) 0.0 - 149.0 mg/dL   HDL 91.47 >82.95 mg/dL   VLDL 62.1 0.0 - 30.8 mg/dL   LDL Cholesterol 95 0 - 99 mg/dL   Total CHOL/HDL Ratio 3    NonHDL 130.58     Assessment & Plan:   Problem List Items Addressed This Visit     Health maintenance examination - Primary (Chronic)    Preventative protocols reviewed and updated unless pt declined. Discussed healthy diet and lifestyle.       Advanced care planning/counseling discussion (Chronic)    Advanced directive discussion - would want daughter Marchelle Gearing to be HCPOA. Ok with temporary measure, doesn't want prolonged life support if terminal condition. Packet previously provided.       IBS (irritable bowel syndrome)    Longstanding hx, manages with [place].       Relevant Medications   polyethylene glycol powder (GLYCOLAX/MIRALAX) 17 GM/SCOOP powder   Dyslipidemia    Chronic, stable on lovastatin - continue. The 10-year ASCVD risk score (Arnett DK, et al., 2019) is: 19.2%   Values used to calculate the score:     Age: 57 years     Sex: Female     Is Non-Hispanic African American: No     Diabetic: No     Tobacco smoker: No     Systolic Blood Pressure: 118 mmHg     Is BP treated: No     HDL Cholesterol: 61.5 mg/dL     Total Cholesterol: 192 mg/dL       Relevant Medications   lovastatin (MEVACOR) 40 MG tablet   Burning sensation of foot    Improved with acupuncture. Continues gabapentin 300/600mg  daily.      Osteoporosis    Latest prolia 09/25/2022 - last dose.  Planning to start oral bisphosphonate fosamax in the spring.       Fibromyalgia    Consider LDN.       Relevant Medications   nortriptyline (PAMELOR) 25 MG capsule   gabapentin (NEURONTIN) 600 MG tablet   Vitamin B12 deficiency    Levels have normalized off replacement - will remain off this.       COPD  (chronic obstructive pulmonary disease) (HCC)    Stable period off respiratory medication. Ex smoker.       Thoracic aortic atherosclerosis (HCC)    Continue low potency statin      Relevant Medications   lovastatin (MEVACOR) 40 MG tablet   Ex-smoker   History of lung cancer in adulthood    Appreciate onc care.       Chronic constipation    Linzess ineffective. She continues miralax, stool softener, activia. Discussed dietary fiber intake, good water intake.  Rec 1/2 capful miralax daily, hold for loose stools.       Relevant Medications   polyethylene glycol powder (GLYCOLAX/MIRALAX) 17 GM/SCOOP powder   Hyperkalemia    Possibly due to high potassium diet.  Update levels today with lower dietary potassium intake.       Relevant Orders   Basic metabolic panel     Meds ordered this encounter  Medications   lovastatin (MEVACOR) 40 MG tablet    Sig: Take 1 tablet (40 mg total) by mouth at bedtime.    Dispense:  90 tablet    Refill:  4   nortriptyline (PAMELOR) 25 MG capsule    Sig: Take 1 capsule (25 mg total) by mouth at bedtime.    Dispense:  90 capsule    Refill:  4   DISCONTD: gabapentin (NEURONTIN) 600 MG tablet    Sig: Take 1 tablet (600 mg total) by mouth 2 (two) times daily.    Dispense:  180 tablet    Refill:  4   gabapentin (NEURONTIN) 600 MG tablet    Sig: Take 0.5 tablets (300 mg total) by mouth in the morning AND 1 tablet (600 mg total) at bedtime.    Dispense:  135 tablet    Refill:  4    Use this dose    Orders Placed This Encounter  Procedures   Basic metabolic panel    Patient Instructions  Recheck potassium levels today.  Come March 2025 we will start fosamax weekly pill.  Call to schedule mammogram at your convenience: [facility] at [facility] [phone] For constipation - increase fiber in the diet, take 1/2 capful of miralax daily.  Bring [place] a copy of your living will to update your chart.  Good to see you today Return as  needed or in 6 months for follow up visit.   Follow up plan: Return in about 6 months (around 05/18/2023) for follow up visit.  Eustaquio Boyden, MD

## 2022-11-17 NOTE — Assessment & Plan Note (Addendum)
Improved with acupuncture. Continues gabapentin 300/600mg  daily.

## 2022-11-17 NOTE — Assessment & Plan Note (Signed)
Consider LDN.

## 2022-11-17 NOTE — Assessment & Plan Note (Signed)
Appreciate onc care.  

## 2022-11-18 ENCOUNTER — Ambulatory Visit
Admission: RE | Admit: 2022-11-18 | Discharge: 2022-11-18 | Disposition: A | Discharge status: Home / Self Care | Payer: Federal, State, Local not specified - PPO | Source: Ambulatory Visit | Attending: Oncology | Admitting: Oncology

## 2022-11-18 ENCOUNTER — Other Ambulatory Visit: Payer: Self-pay | Admitting: Family Medicine

## 2022-11-18 DIAGNOSIS — Z85118 Personal history of other malignant neoplasm of bronchus and lung: Secondary | ICD-10-CM | POA: Insufficient documentation

## 2022-11-18 DIAGNOSIS — Z1231 Encounter for screening mammogram for malignant neoplasm of breast: Secondary | ICD-10-CM

## 2022-11-18 DIAGNOSIS — Z08 Encounter for follow-up examination after completed treatment for malignant neoplasm: Secondary | ICD-10-CM | POA: Insufficient documentation

## 2022-11-18 LAB — ZINC: Zinc: 75 ug/dL (ref 60–130)

## 2022-11-26 ENCOUNTER — Encounter: Payer: Self-pay | Admitting: Oncology

## 2022-11-26 ENCOUNTER — Inpatient Hospital Stay: Payer: Federal, State, Local not specified - PPO | Attending: Oncology | Admitting: Oncology

## 2022-11-26 VITALS — BP 128/83 | HR 92 | Temp 95.8°F | Resp 17 | Ht 64.0 in | Wt 124.5 lb

## 2022-11-26 DIAGNOSIS — Z85118 Personal history of other malignant neoplasm of bronchus and lung: Secondary | ICD-10-CM | POA: Insufficient documentation

## 2022-11-26 DIAGNOSIS — C3492 Malignant neoplasm of unspecified part of left bronchus or lung: Secondary | ICD-10-CM

## 2022-11-26 DIAGNOSIS — Z87891 Personal history of nicotine dependence: Secondary | ICD-10-CM | POA: Diagnosis not present

## 2022-11-26 DIAGNOSIS — Z08 Encounter for follow-up examination after completed treatment for malignant neoplasm: Secondary | ICD-10-CM

## 2022-11-26 NOTE — Progress Notes (Signed)
Hematology/Oncology Consult note [facility]  [phone] [phone]  Patient Care Team: Eustaquio Boyden, MD as PCP - General (Family Medicine) Glory Buff, RN as Oncology Nurse Navigator Creig Hines, MD as Consulting Physician (Oncology)   Name of the patient: Cassie Shaw  [id number]  10/03/1945   Date of visit: 11/26/22  Diagnosis- history of lung cancer in 2021 s/p resection   Chief complaint/ Reason for visit-discuss CT scan results and further management  Heme/Onc history:  Patient is a 77 year old female with history of irritable bowel syndrome, peripheral neuropathy, tobacco abuse among other medical problems.  She underwent a low-dose screening CT chest on October 2021 which showed a lung nodule in the left lower lobe 11.3 mm with a solid component of 6.6 mm.  There was improvement in the size of this nodule 3 months later.  A PET CT scan showed mild metabolic activity within the cavitary lesion in the left lower lobe.  No evidence of mediastinal or hilar adenopathy or distant metastatic disease.  Patient was seen by Dr. Dorris Fetch and underwent left lower lobe wedge resection on 03/15/2020.   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  No evidence of lymphovascular or visceral pleural invasion.  10 lymph nodes were negative for malignancy.  p T1b pN0  Interval history-patient reports ringing sensation in her years for which she has been using eardrops.  Denies any decrease in hearing.  Denies any cough shortness of breath  ECOG PS- 1 Pain scale- 0   Review of systems- Review of Systems  Constitutional:  Negative for chills, fever, malaise/fatigue and weight loss.  HENT:  Positive for tinnitus. Negative for congestion, ear discharge and nosebleeds.   Eyes:  Negative for blurred vision.  Respiratory:  Negative for cough, hemoptysis, sputum production, shortness of breath and wheezing.    Cardiovascular:  Negative for chest pain, palpitations, orthopnea and claudication.  Gastrointestinal:  Negative for abdominal pain, blood in stool, constipation, diarrhea, heartburn, melena, nausea and vomiting.  Genitourinary:  Negative for dysuria, flank pain, frequency, hematuria and urgency.  Musculoskeletal:  Negative for back pain, joint pain and myalgias.  Skin:  Negative for rash.  Neurological:  Negative for dizziness, tingling, focal weakness, seizures, weakness and headaches.  Endo/Heme/Allergies:  Does not bruise/bleed easily.  Psychiatric/Behavioral:  Negative for depression and suicidal ideas. The patient does not have insomnia.       No Known Allergies   Past Medical History:  Diagnosis Date   Arthritis    B12 deficiency    COPD (chronic obstructive pulmonary disease) (HCC)    Ex-smoker    Fibrocystic breast disease    Fibromyalgia    History of chicken pox    History of depression    and anxiety   HLD (hyperlipidemia)    IBS (irritable bowel syndrome)    Osteoporosis 2015   on/off fosamax DEXA T -3.5 spine, -2.5 hip   Peripheral neuropathy    Retinal detachment      Past Surgical History:  Procedure Laterality Date   CATARACT EXTRACTION Bilateral 2014   with lens implant   COLONOSCOPY  12/2005   WNL ([facility])   COLONOSCOPY  08/2017   5 TAs, rpt 5 yrs ([facility])   DEXA  01/2013   T -3.5 spine, -2.5 hip   INTERCOSTAL NERVE BLOCK Left 03/15/2020   Procedure: INTERCOSTAL NERVE BLOCK;  Surgeon: Loreli Slot, MD;  Location: [facility] OR;  Service: Thoracic;  Laterality:  Left;   LYMPH NODE DISSECTION Bilateral 03/15/2020   Procedure: LYMPH NODE DISSECTION;  Surgeon: Loreli Slot, MD;  Location: [facility] OR;  Service: Thoracic;  Laterality: Bilateral;   PARTIAL HYSTERECTOMY  1975   for heavy bleeding, ovaries remained   RETINAL DETACHMENT SURGERY  2014   Dr Luciana Axe   VIDEO ASSISTED THORACOSCOPY (VATS)/WEDGE RESECTION Left 02/2020   Robotic left  video-assisted thoracoscopy, L lower lobe wedge resection, L lower lobectomy and LN dissection Dorris Fetch)    Social History   Socioeconomic History   Marital status: Married    Spouse name: Not on file   Number of children: Not on file   Years of education: Not on file   Highest education level: Not on file  Occupational History   Not on file  Tobacco Use   Smoking status: Former    Current packs/day: 0.00    Average packs/day: 1 pack/day for 46.0 years (46.0 ttl pk-yrs)    Types: Cigarettes    Start date: 01/20/1958    Quit date: 01/21/2004    Years since quitting: 18.8    Passive exposure: Past   Smokeless tobacco: Never  Vaping Use   Vaping status: Never Used  Substance and Sexual Activity   Alcohol use: No    Alcohol/week: 0.0 standard drinks of alcohol   Drug use: No   Sexual activity: Not on file  Other Topics Concern   Not on file  Social History Narrative   Lives with husband Roger [facility]), 2 dogs and a bird   Occupation: retired, was Education officer, environmental   Activity: no regular exercise   Diet: good water, fruits/vegetables daily   Social Determinants of Corporate investment banker Strain: Low Risk  (11/16/2022)   Overall Financial Resource Strain (CARDIA)    Difficulty of Paying Living Expenses: Not hard at all  Food Insecurity: No Food Insecurity (11/16/2022)   Hunger Vital Sign    Worried About Running Out of Food in the Last Year: Never true    Ran Out of Food in the Last Year: Never true  Transportation Needs: Patient Declined (11/16/2022)   PRAPARE - Transportation    Lack of Transportation (Medical): Patient declined    Lack of Transportation (Non-Medical): Patient declined  Physical Activity: Insufficiently Active (11/16/2022)   Exercise Vital Sign    Days of Exercise per Week: 3 days    Minutes of Exercise per Session: 10 min  Stress: Patient Declined (11/16/2022)   [employer] of Occupational Health - Occupational Stress Questionnaire    Feeling of  Stress : Patient declined  Social Connections: Unknown (11/16/2022)   Social Connection and Isolation Panel [NHANES]    Frequency of Communication with Friends and Family: Patient declined    Frequency of Social Gatherings with Friends and Family: Patient declined    Attends Religious Services: Patient declined    Database administrator or Organizations: Not on file    Attends Banker Meetings: Not on file    Marital Status: Patient declined  Intimate Partner Violence: Not on file    Family History  Problem Relation Age of Onset   Arthritis Mother    Cancer Mother        smoker   Cancer Sister        bone   Diabetes Maternal Uncle    Cancer Maternal Grandfather    Stroke Neg Hx    CAD Neg Hx    Breast cancer Neg Hx  Current Outpatient Medications:    acetaminophen (TYLENOL) 325 MG tablet, Take 650 mg by mouth every 6 (six) hours as needed., Disp: , Rfl:    Cholecalciferol (VITAMIN D3) 1000 units CAPS, Take 1 capsule (1,000 Units total) by mouth daily., Disp: 30 capsule, Rfl:    denosumab (PROLIA) 60 MG/ML SOSY injection, Inject 60 mg into the skin every 6 (six) months., Disp: 1 mL, Rfl: 0   gabapentin (NEURONTIN) 600 MG tablet, Take 0.5 tablets (300 mg total) by mouth in the morning AND 1 tablet (600 mg total) at bedtime., Disp: 135 tablet, Rfl: 4   ibuprofen (ADVIL) 200 MG tablet, Take 200 mg by mouth 2 (two) times daily as needed for moderate pain., Disp: , Rfl:    ipratropium (ATROVENT) 0.06 % nasal spray, Place 2 sprays into both nostrils 4 (four) times daily., Disp: 15 mL, Rfl: 1   lovastatin (MEVACOR) 40 MG tablet, Take 1 tablet (40 mg total) by mouth at bedtime., Disp: 90 tablet, Rfl: 4   melatonin 1 MG TABS tablet, Take 3 mg by mouth at bedtime., Disp: , Rfl:    Multiple Vitamin (MULTIVITAMIN) tablet, Take 1 tablet by mouth daily., Disp: , Rfl:    nortriptyline (PAMELOR) 25 MG capsule, Take 1 capsule (25 mg total) by mouth at bedtime., Disp: 90 capsule,  Rfl: 4   polyethylene glycol powder (GLYCOLAX/MIRALAX) 17 GM/SCOOP powder, Take 8.5 g by mouth daily., Disp: , Rfl:   Physical exam:  Vitals:   11/26/22 1018  BP: 128/83  Pulse: 92  Resp: 17  Temp: (!) 95.8 F (35.4 C)  TempSrc: Tympanic  SpO2: 100%  Weight: 124 lb 8 oz (56.5 kg)  Height: 5\' 4"  (1.626 m)   Physical Exam Cardiovascular:     Rate and Rhythm: Normal rate and regular rhythm.     Heart sounds: Normal heart sounds.  Pulmonary:     Effort: Pulmonary effort is normal.     Breath sounds: Normal breath sounds.  Abdominal:     General: Bowel sounds are normal.     Palpations: Abdomen is soft.  Skin:    General: Skin is warm and dry.  Neurological:     Mental Status: She is alert and oriented to person, place, and time.         Latest Ref Rng & Units 11/17/2022    1:01 PM  CMP  Glucose 70 - 99 mg/dL 161   BUN 6 - 23 mg/dL 8   Creatinine 0.96 - 0.45 mg/dL 4.09   Sodium 811 - 914 mEq/L 139   Potassium 3.5 - 5.1 mEq/L 4.6   Chloride 96 - 112 mEq/L 102   CO2 19 - 32 mEq/L 28   Calcium 8.4 - 10.5 mg/dL 9.8       Latest Ref Rng & Units 11/10/2022    9:55 AM  CBC  WBC 4.0 - 10.5 K/uL 6.2   Hemoglobin 12.0 - 15.0 g/dL 78.2   Hematocrit 95.6 - 46.0 % 42.9   Platelets 150.0 - 400.0 K/uL 226.0     No images are attached to the encounter.  CT Chest Wo Contrast  Result Date: 11/26/2022 CLINICAL DATA:  Lung cancer surveillance * Tracking Code: BO * EXAM: CT CHEST WITHOUT CONTRAST TECHNIQUE: Multidetector CT imaging of the chest was performed following the standard protocol without IV contrast. RADIATION DOSE REDUCTION: This exam was performed according to the departmental dose-optimization program which includes automated exposure control, adjustment of the mA and/or kV according to patient  size and/or use of iterative reconstruction technique. COMPARISON:  05/07/2022 FINDINGS: Cardiovascular: Aortic atherosclerosis. Normal heart size. Left coronary artery  calcifications. No pericardial effusion. Mediastinum/Nodes: No enlarged mediastinal, hilar, or axillary lymph nodes. Thyroid gland, trachea, and esophagus demonstrate no significant findings. Lungs/Pleura: Unchanged postoperative appearance of the chest status post left lower lobectomy. Unchanged mild pulmonary fibrosis in a pattern with apical to basal gradient featuring irregular peripheral interstitial opacity and septal thickening with small areas of subpleural bronchiolectasis of the left lung base. No pleural effusion or pneumothorax. Upper Abdomen: No acute abnormality. Musculoskeletal: No chest wall abnormality. No acute osseous findings. IMPRESSION: 1. Unchanged postoperative appearance of the chest status post left lower lobectomy. No evidence of recurrent or metastatic disease in the chest. 2. Unchanged mild pulmonary fibrosis in a pattern with apical to basal gradient featuring irregular peripheral interstitial opacity and septal thickening with small areas of subpleural bronchiolectasis of the left lung base. Findings are categorized as probable UIP per consensus guidelines: Diagnosis of Idiopathic Pulmonary Fibrosis: An Official ATS/ERS/JRS/ALAT Clinical Practice Guideline. Am Rosezetta Schlatter Crit Care Med Vol 198, Iss 5, [phone], Sep 20 2016. 3. Coronary artery disease. Aortic Atherosclerosis (ICD10-I70.0). Electronically Signed   By: Jearld Lesch M.D.   On: 11/26/2022 11:10     Assessment and plan- Patient is a 77 y.o. 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  She is here for a routine surveillance visit  I have reviewed CT chest images independently and discussed findings with the patient.  Official reports were not back at the time of patient's visit.  CT scan shows no evidence of recurrent or progressive disease.  Unchanged postoperative changes in the left lower lobectomy area.  She was noted to have mild pulmonary fibrosis even on her prior CT scan  which has remained unchanged.  I will call her with the results of the CT scan.  Plan is to repeat CT scan again in 6 months time   Visit Diagnosis 1. Encounter for follow-up surveillance of lung cancer   2. Squamous cell lung cancer, left (HCC)      Dr. Owens Shark, MD, MPH [facility] [phone] 11/26/2022 1:07 PM

## 2022-12-03 ENCOUNTER — Ambulatory Visit
Admission: RE | Admit: 2022-12-03 | Discharge: 2022-12-03 | Disposition: A | Discharge status: Home / Self Care | Payer: Federal, State, Local not specified - PPO | Source: Ambulatory Visit | Attending: Family Medicine | Admitting: Family Medicine

## 2022-12-03 DIAGNOSIS — Z1231 Encounter for screening mammogram for malignant neoplasm of breast: Secondary | ICD-10-CM | POA: Diagnosis present

## 2022-12-25 ENCOUNTER — Encounter: Payer: Self-pay | Admitting: Family Medicine

## 2022-12-25 DIAGNOSIS — Z1211 Encounter for screening for malignant neoplasm of colon: Secondary | ICD-10-CM

## 2022-12-25 DIAGNOSIS — D126 Benign neoplasm of colon, unspecified: Secondary | ICD-10-CM

## 2022-12-26 NOTE — Addendum Note (Signed)
Addended by: Eustaquio Boyden on: 12/26/2022 07:22 AM   Modules accepted: Orders

## 2023-02-17 ENCOUNTER — Telehealth: Payer: Self-pay

## 2023-02-17 NOTE — Telephone Encounter (Signed)
The patient and her husband called in and left a voicemail for [place] to call her back. I called him back to let them know that I received his message, and they wanted to know will she be able to get schedule for them colonoscopy. I told them that is what the consult is for to discuss the colonoscopy.

## 2023-02-23 ENCOUNTER — Telehealth: Payer: Self-pay

## 2023-02-23 DIAGNOSIS — M810 Age-related osteoporosis without current pathological fracture: Secondary | ICD-10-CM

## 2023-02-23 NOTE — Telephone Encounter (Signed)
Copied from CRM [id number]. Topic: Clinical - Medication Question >> Feb 23, 2023  9:31 AM Corin V wrote: Reason for CRM: Patient said she is due for her next Prolia shot but she does not want to do another shot. She has spoken Dr. Sharen Hones about switching to a pill for her osteoporosis. Please send in the Rx discussed during her last visit to the [facility] in [place], [place].

## 2023-02-24 ENCOUNTER — Telehealth: Payer: Self-pay

## 2023-02-24 ENCOUNTER — Ambulatory Visit: Payer: Federal, State, Local not specified - PPO | Admitting: Physician Assistant

## 2023-02-24 MED ORDER — ALENDRONATE SODIUM 70 MG PO TABS: 70.0000 mg | ORAL_TABLET | ORAL | 11 refills | Status: DC

## 2023-02-24 NOTE — Telephone Encounter (Signed)
 The patient in and left a voicemail stating she cancel her appointment but that was an error and she would like to reschedule her office visit. I called the patient back to let her know we received her message, and I explain to her that she will have to go on the waiting list until we have more days. I told her we will call her back once we have more days.

## 2023-02-24 NOTE — Telephone Encounter (Addendum)
 Please notify I have sent in fosamax  to take once weekly. She can start towards end of this month as next prolia  shot wouldn't be due until 03/25/2023.   Administer first thing in the morning and >=30 minutes before the first food, beverage (except plain water), or other medication(s) of the day. Do not take with mineral water or with other beverages. Patients should be instructed to stay upright (not to lie down) for >=30 minutes and until after first food of the day (to reduce esophageal irritation). Must be taken with 6 to 8 oz of plain water. The tablet should be swallowed whole; do not chew or suck.

## 2023-02-24 NOTE — Addendum Note (Signed)
Addended by: Eustaquio Boyden on: 02/24/2023 07:43 AM   Modules accepted: Orders

## 2023-02-25 NOTE — Telephone Encounter (Signed)
 Spoke pt relaying Dr Ocie Belt message, emphasizing instructions . Pt verbalizes understanding.

## 2023-02-26 ENCOUNTER — Other Ambulatory Visit: Payer: Self-pay

## 2023-02-26 ENCOUNTER — Ambulatory Visit: Payer: Federal, State, Local not specified - PPO | Admitting: Physician Assistant

## 2023-02-26 VITALS — BP 119/77 | HR 90 | Temp 97.7°F | Ht 64.0 in | Wt 126.8 lb

## 2023-02-26 DIAGNOSIS — R634 Abnormal weight loss: Secondary | ICD-10-CM

## 2023-02-26 DIAGNOSIS — Z860101 Personal history of adenomatous and serrated colon polyps: Secondary | ICD-10-CM | POA: Diagnosis not present

## 2023-02-26 DIAGNOSIS — K5904 Chronic idiopathic constipation: Secondary | ICD-10-CM

## 2023-02-26 DIAGNOSIS — R1084 Generalized abdominal pain: Secondary | ICD-10-CM

## 2023-02-26 DIAGNOSIS — Z860100 Personal history of colon polyps, unspecified: Secondary | ICD-10-CM | POA: Insufficient documentation

## 2023-02-26 DIAGNOSIS — R1030 Lower abdominal pain, unspecified: Secondary | ICD-10-CM

## 2023-02-26 DIAGNOSIS — K581 Irritable bowel syndrome with constipation: Secondary | ICD-10-CM | POA: Diagnosis not present

## 2023-02-26 HISTORY — DX: Personal history of colon polyps, unspecified: Z86.0100

## 2023-02-26 HISTORY — DX: Abnormal weight loss: R63.4

## 2023-02-26 MED ORDER — LINACLOTIDE 290 MCG PO CAPS
290.0000 ug | ORAL_CAPSULE | Freq: Every day | ORAL | 5 refills | Status: DC
Start: 2023-02-26 — End: 2023-03-26

## 2023-02-26 NOTE — Patient Instructions (Addendum)
 CT abdomen/pelvis with contrast [facility] arrive at 9:30 am . Follow up 4 weeks.

## 2023-02-26 NOTE — Progress Notes (Signed)
 Cassie Console, Cassie Shaw [address]  [address]  [place], [place] [address]  Main: [phone]  Fax: [phone]   Gastroenterology Consultation  Referring Provider:     Rilla Baller, MD Primary Care Physician:  Rilla Baller, MD Primary Gastroenterologist:  Cassie Console, Cassie Shaw  Reason for Consultation:     History of colon polyps, Chronic Constipation, Abdominal Pain.        HPI:   Cassie Shaw is a 78 y.o. y/o female referred for consultation & management  by Rilla Baller, MD.    Patient is here to discuss scheduling repeat colonoscopy.  History of colon polyps.  She reports having chronic constipation for many years.  She is worried about a bowel blockage.  She has small hard balls of stool every few days.  Currently taking MiraLAX with little benefit.  She has intermittent lower abdominal pain which comes and goes.  Denies nausea, vomiting, rectal bleeding.  No family history of colon cancer.  08/2017 colonoscopy by Dr. Kristie: 5 small tubular adenoma polyps removed.  No further colonoscopies were recommended due to advanced age.  2007 screening colonoscopy was normal.  Medical Hx of COPD, IBS, Lung Cancer s/p left lower lob wedge resection 02/2020, Fibromyalgia.  Not on oxygen.  Past Medical History:  Diagnosis Date   Abnormal weight loss 02/26/2023   Arthritis    B12 deficiency    COPD (chronic obstructive pulmonary disease) (HCC)    Ex-smoker    Fibrocystic breast disease    Fibromyalgia    History of chicken pox    History of colonic polyps 02/26/2023   History of depression    and anxiety   HLD (hyperlipidemia)    IBS (irritable bowel syndrome)    Osteoporosis 2015   on/off fosamax  DEXA T -3.5 spine, -2.5 hip   Peripheral neuropathy    Retinal detachment     Past Surgical History:  Procedure Laterality Date   CATARACT EXTRACTION Bilateral 2014   with lens implant   COLONOSCOPY  12/2005   WNL ([facility])   COLONOSCOPY  08/2017   5 TAs, rpt 5  yrs ([facility])   DEXA  01/2013   T -3.5 spine, -2.5 hip   INTERCOSTAL NERVE BLOCK Left 03/15/2020   Procedure: INTERCOSTAL NERVE BLOCK;  Surgeon: Kerrin Elspeth BROCKS, MD;  [facility] OR;  Service: Thoracic;  Laterality: Left;   LYMPH NODE DISSECTION Bilateral 03/15/2020   Procedure: LYMPH NODE DISSECTION;  Surgeon: Kerrin Elspeth BROCKS, MD;  Location: [facility] OR;  Service: Thoracic;  Laterality: Bilateral;   PARTIAL HYSTERECTOMY  1975   for heavy bleeding, ovaries remained   RETINAL DETACHMENT SURGERY  2014   Dr Elner   VIDEO ASSISTED THORACOSCOPY (VATS)/WEDGE RESECTION Left 02/2020   Robotic left video-assisted thoracoscopy, L lower lobe wedge resection, L lower lobectomy and LN dissection Arnaldo)    Prior to Admission medications   Medication Sig Start Date End Date Taking? Authorizing Provider  acetaminophen  (TYLENOL ) 325 MG tablet Take 650 mg by mouth every 6 (six) hours as needed.    [provider]  alendronate  (FOSAMAX ) 70 MG tablet Take 1 tablet (70 mg total) by mouth every 7 (seven) days. Take with a full glass of water on an empty stomach. 02/24/23   Rilla Baller, MD  Cholecalciferol (VITAMIN D3) 1000 units CAPS Take 1 capsule (1,000 Units total) by mouth daily. 03/15/16   Rilla Baller, MD  gabapentin  (NEURONTIN ) 600 MG tablet Take 0.5 tablets (300 mg total) by mouth in  the morning AND 1 tablet (600 mg total) at bedtime. 11/17/22   Rilla Baller, MD  ibuprofen (ADVIL) 200 MG tablet Take 200 mg by mouth 2 (two) times daily as needed for moderate pain.    [provider]  ipratropium (ATROVENT ) 0.06 % nasal spray Place 2 sprays into both nostrils 4 (four) times daily. 08/15/21   [place], Jacques, MD  lovastatin  (MEVACOR ) 40 MG tablet Take 1 tablet (40 mg total) by mouth at bedtime. 11/17/22   Rilla Baller, MD  melatonin 1 MG TABS tablet Take 3 mg by mouth at bedtime.    [provider]  Multiple Vitamin (MULTIVITAMIN) tablet Take 1 tablet  by mouth daily.    [provider]  nortriptyline  (PAMELOR ) 25 MG capsule Take 1 capsule (25 mg total) by mouth at bedtime. 11/17/22   Rilla Baller, MD  polyethylene glycol powder [facility]) 17 GM/SCOOP powder Take 8.5 g by mouth daily. 11/17/22   Rilla Baller, MD    Family History  Problem Relation Age of Onset   Arthritis Mother    Cancer Mother        smoker   Cancer Sister        bone   Diabetes Maternal Uncle    Cancer Maternal Grandfather    Stroke Neg Hx    CAD Neg Hx    Breast cancer Neg Hx      Social History   Tobacco Use   Smoking status: Former    Current packs/day: 0.00    Average packs/day: 1 pack/day for 46.0 years (46.0 ttl pk-yrs)    Types: Cigarettes    Start date: 01/20/1958    Quit date: 01/21/2004    Years since quitting: 19.1    Passive exposure: Past   Smokeless tobacco: Never  Vaping Use   Vaping status: Never Used  Substance Use Topics   Alcohol use: No    Alcohol/week: 0.0 standard drinks of alcohol   Drug use: No    Allergies as of 02/26/2023   (No Known Allergies)    Review of Systems:    All systems reviewed and negative except where noted in HPI.   Physical Exam:  BP 119/77   Pulse 90   Temp 97.7 F (36.5 C)   Ht 5' 4 (1.626 m)   Wt 126 lb 12.8 oz (57.5 kg)   BMI 21.77 kg/m  No LMP recorded. Patient has had a hysterectomy.  General:   Alert,  Well-developed, well-nourished, pleasant and cooperative in NAD Lungs:  Respirations even and unlabored.  Clear throughout to auscultation.   No wheezes, crackles, or rhonchi. No acute distress. Heart:  Regular rate and rhythm; no murmurs, clicks, rubs, or gallops. Abdomen:  Normal bowel sounds.  No bruits.  Soft, and non-distended without masses, hepatosplenomegaly or hernias noted.  Mild bilateral lower abdominal tenderness.  No upper abdominal tenderness.  No guarding or rebound tenderness.    Neurologic:  Alert and oriented x3;  grossly normal  neurologically. Psych:  Alert and cooperative.  Depressed mood and affect.  Imaging Studies: No results found.  Assessment and Plan:   Cassie Shaw is a 78 y.o. y/o female has been referred for:  Chronic constipation  I gave samples of Linzess  1 capsule once daily.  If samples work well, then she will call me back for prescription.  Recommend high-fiber diet with fresh fruits, vegetables, whole grains.  Recommend 64 ounces of fluids daily.  Lower abdominal pain CT abdomen pelvis with contrast  Irritable bowel syndrome, constipation predominant  Try Linzess  290mcg once daily.  History of adenomatous colon polyps  We discussed risk and benefits of colonoscopy at length to include risk of colon perforation, bleeding, and risk of sedation.  She would be at increased risk for colonoscopy given her advanced age and history of lung disease.  I offered to schedule colonoscopy and patient wants to think about it first.  We will start evaluation with abdominal pelvic CT and treat constipation with follow-up.  Follow up in 4 weeks with TG for constipation and then decide about colonoscopy.  Cassie Console, Cassie Shaw

## 2023-02-27 ENCOUNTER — Ambulatory Visit: Payer: Federal, State, Local not specified - PPO | Admitting: Physician Assistant

## 2023-03-10 ENCOUNTER — Ambulatory Visit
Admission: RE | Admit: 2023-03-10 | Discharge: 2023-03-10 | Disposition: A | Discharge status: Home / Self Care | Payer: Federal, State, Local not specified - PPO | Source: Ambulatory Visit | Attending: Physician Assistant | Admitting: Physician Assistant

## 2023-03-10 ENCOUNTER — Encounter: Payer: Self-pay | Admitting: Physician Assistant

## 2023-03-10 DIAGNOSIS — R1084 Generalized abdominal pain: Secondary | ICD-10-CM | POA: Insufficient documentation

## 2023-03-10 LAB — POCT I-STAT CREATININE: Creatinine, Ser: 1 mg/dL (ref 0.44–1.00)

## 2023-03-10 MED ORDER — IOHEXOL 300 MG/ML  SOLN
100.0000 mL | Freq: Once | INTRAMUSCULAR | Status: AC | PRN
  Administered 2023-03-10: 100 mL via INTRAVENOUS

## 2023-03-26 ENCOUNTER — Encounter: Payer: Self-pay | Admitting: Physician Assistant

## 2023-03-26 ENCOUNTER — Telehealth: Payer: Self-pay

## 2023-03-26 ENCOUNTER — Telehealth: Payer: Self-pay | Admitting: Physician Assistant

## 2023-03-26 MED ORDER — LINACLOTIDE 290 MCG PO CAPS: 290.0000 ug | ORAL_CAPSULE | Freq: Every day | ORAL | 5 refills | Status: DC

## 2023-03-26 NOTE — Telephone Encounter (Signed)
 Spoke with patient-she is taking the Linzess 209 mcg- she stated she is not taking everyday because her stools were becoming loose- I encouraged her to take it everyday and see if the loose stools get better after trying it for a longer period. She will let us know how it works. Drink plenty of fluids as well.

## 2023-03-26 NOTE — Telephone Encounter (Signed)
 Pt requesting call back to get test results

## 2023-03-26 NOTE — Addendum Note (Signed)
 Addended by: Martyn Ehrich on: 03/26/2023 04:55 PM   Modules accepted: Orders

## 2023-03-26 NOTE — Telephone Encounter (Signed)
 Patient called office today for results of CT scan done 03/10/23- I called Radiology this morning to have them read as soon as possible- the results are back-please advise.

## 2023-05-05 ENCOUNTER — Ambulatory Visit (INDEPENDENT_AMBULATORY_CARE_PROVIDER_SITE_OTHER)
Admission: RE | Admit: 2023-05-05 | Discharge: 2023-05-05 | Disposition: A | Discharge status: Home / Self Care | Source: Ambulatory Visit | Attending: Family Medicine | Admitting: Family Medicine

## 2023-05-05 ENCOUNTER — Ambulatory Visit: Admitting: Family Medicine

## 2023-05-05 ENCOUNTER — Encounter: Payer: Self-pay | Admitting: Family Medicine

## 2023-05-05 VITALS — BP 126/82 | HR 87 | Temp 98.1°F | Ht 64.0 in | Wt 124.4 lb

## 2023-05-05 DIAGNOSIS — R052 Subacute cough: Secondary | ICD-10-CM

## 2023-05-05 DIAGNOSIS — Z85118 Personal history of other malignant neoplasm of bronchus and lung: Secondary | ICD-10-CM | POA: Diagnosis not present

## 2023-05-05 DIAGNOSIS — R0789 Other chest pain: Secondary | ICD-10-CM | POA: Insufficient documentation

## 2023-05-05 DIAGNOSIS — R1310 Dysphagia, unspecified: Secondary | ICD-10-CM | POA: Diagnosis not present

## 2023-05-05 MED ORDER — BENZONATATE 100 MG PO CAPS: 100.0000 mg | ORAL_CAPSULE | Freq: Three times a day (TID) | ORAL | 0 refills | Status: DC | PRN

## 2023-05-05 NOTE — Progress Notes (Signed)
 Ph: [phone] Fax: [phone]   Patient ID: Cassie Shaw, female    DOB: 1945-04-20, 78 y.o.   MRN: [id number]  This visit was conducted in person.  BP 126/82   Pulse 87   Temp 98.1 F (36.7 C) (Oral)   Ht 5\' 4"  (1.626 m)   Wt 124 lb 6 oz (56.4 kg)   SpO2 94%   BMI 21.35 kg/m    CC: cough, L chest pain  Subjective:   HPI: Cassie Shaw is a 78 y.o. female presenting on 05/05/2023 for Cough (C/o ongoing prod cough- with mucous. Also, c/o pain in L chest when coughing. Started about 1 mo ago. Tried several OTC cough meds, helpful. )   1 mo h/o productive cough of yellow to clear mucous associated with L chest pain when coughing. Had an episode late last week of significant chest pain with racing heart. Some dyspnea with this. Some L earache.   No fevers/chills, tooth pain, ST, headache, wheezing, abd pain, body aches.   Tried robitussin, cough/congestion, mucous pills without benefit.   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   She notes she can get strangled when swallowing predominantly to liquids, and this exacerbates cough. No trouble with solids.   Saw [facility] Amy PA for constipation - limited benefit wit hLinzess. She started eating molasses instead with benefit.   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      Relevant past medical, surgical, family and social history reviewed and updated as indicated. Interim medical history since our last visit reviewed. Allergies and medications reviewed and updated. Outpatient Medications Prior to Visit  Medication Sig Dispense Refill   acetaminophen (TYLENOL) 325 MG tablet Take 650 mg by mouth every 6 (six) hours as needed.     alendronate (FOSAMAX) 70 MG tablet Take 1 tablet (70 mg total) by mouth every 7 (seven) days. Take with a full glass of water on an empty stomach. 4 tablet 11   Cholecalciferol (VITAMIN D3) 1000 units CAPS  Take 1 capsule (1,000 Units total) by mouth daily. 30 capsule    gabapentin (NEURONTIN) 600 MG tablet Take 0.5 tablets (300 mg total) by mouth in the morning AND 1 tablet (600 mg total) at bedtime. 135 tablet 4   ibuprofen (ADVIL) 200 MG tablet Take 200 mg by mouth 2 (two) times daily as needed for moderate pain.     ipratropium (ATROVENT) 0.06 % nasal spray Place 2 sprays into both nostrils 4 (four) times daily. 15 mL 1   lovastatin (MEVACOR) 40 MG tablet Take 1 tablet (40 mg total) by mouth at bedtime. 90 tablet 4   melatonin 1 MG TABS tablet Take 3 mg by mouth at bedtime.     Multiple Vitamin (MULTIVITAMIN) tablet Take 1 tablet by mouth daily.     nortriptyline (PAMELOR) 25 MG capsule Take 1 capsule (25 mg total) by mouth at bedtime. 90 capsule 4   polyethylene glycol powder (GLYCOLAX/MIRALAX) 17 GM/SCOOP powder Take 8.5 g by mouth daily.     linaclotide (LINZESS) 290 MCG CAPS capsule Take 1 capsule (290 mcg total) by mouth daily before breakfast. 30 capsule 5   No facility-administered medications prior to visit.     Per HPI unless specifically indicated in ROS section below Review of Systems  Objective:  BP 126/82   Pulse 87   Temp 98.1 F (36.7 C) (Oral)  Ht 5\' 4"  (1.626 m)   Wt 124 lb 6 oz (56.4 kg)   SpO2 94%   BMI 21.35 kg/m   Wt Readings from Last 3 Encounters:  05/05/23 124 lb 6 oz (56.4 kg)  02/26/23 126 lb 12.8 oz (57.5 kg)  11/26/22 124 lb 8 oz (56.5 kg)      Physical Exam Vitals and nursing note reviewed.  Constitutional:      Appearance: Normal appearance. She is not ill-appearing.  HENT:     Head: Normocephalic and atraumatic.     Right Ear: Tympanic membrane, ear canal and external ear normal. There is no impacted cerumen.     Left Ear: Tympanic membrane, ear canal and external ear normal. There is no impacted cerumen.     Mouth/Throat:     Mouth: Mucous membranes are moist.     Pharynx: Oropharynx is clear. No oropharyngeal exudate or posterior  oropharyngeal erythema.  Eyes:     Extraocular Movements: Extraocular movements intact.     Conjunctiva/sclera: Conjunctivae normal.     Pupils: Pupils are equal, round, and reactive to light.  Cardiovascular:     Rate and Rhythm: Normal rate and regular rhythm.     Pulses: Normal pulses.     Heart sounds: Normal heart sounds. No murmur heard. Pulmonary:     Effort: Pulmonary effort is normal. No respiratory distress.     Breath sounds: Normal breath sounds. No wheezing, rhonchi or rales.  Chest:     Chest wall: Tenderness present.       Comments: Reproducible tenderness to palpation along left upper chest wall, no significant tenderness along costochondral junction.  Lymphadenopathy:     Head:     Right side of head: No submental, submandibular, tonsillar, preauricular or posterior auricular adenopathy.     Left side of head: No submental, submandibular, tonsillar, preauricular or posterior auricular adenopathy.     Cervical: No cervical adenopathy.     Right cervical: No superficial cervical adenopathy.    Left cervical: No superficial cervical adenopathy.     Upper Body:     Right upper body: No supraclavicular adenopathy.     Left upper body: No supraclavicular adenopathy.  Skin:    Findings: No rash.  Neurological:     Mental Status: She is alert.  Psychiatric:        Mood and Affect: Mood normal.        Behavior: Behavior normal.       Results for orders placed or performed during the hospital encounter of 03/10/23  I-STAT creatinine   Collection Time: 03/10/23  9:54 AM  Result Value Ref Range   Creatinine, Ser 1.00 0.44 - 1.00 mg/dL    Assessment & Plan:   Problem List Items Addressed This Visit     History of lung cancer in adulthood   Subacute cough - Primary   Anticipate post-infectious cough that is slowly improving. In lung cancer hx, check CXR today.  Rx tessalon perls.  No signs of ongoing bacterial infection.       Relevant Orders   DG Chest 2  View   Chest wall pain   Reproducible chest wall pain on left side of chest.  Anticipate rib strain after recent respiratory illness with prolonged cough.  Reassurance provided, supportive measures reviewed - heating pad, topical voltaren, tylenol. Update if not improving with treatment as expected.       Dysphagia   Endorses intermittent liquid dysphagia.  This could increase risk of  aspiration pneumonia To let me know if ongoing to order barium swallow study        Meds ordered this encounter  Medications   benzonatate (TESSALON PERLES) 100 MG capsule    Sig: Take 1 capsule (100 mg total) by mouth 3 (three) times daily as needed for cough.    Dispense:  30 capsule    Refill:  0    Orders Placed This Encounter  Procedures   DG Chest 2 View    Standing Status:   Future    Number of Occurrences:   1    Expiration Date:   05/04/2024    Reason for Exam (SYMPTOM  OR DIAGNOSIS REQUIRED):   left chest wall pain, cough    Preferred imaging location?:   [facility]    Patient Instructions  Chest xray today  I think you had chest wall strain after all the coughing.  Use heating pad covered in a towel to chest wall, may try topical diclofenac (voltaren) over the counter anti inflammatory gel  For choking with swallowing liquids 0- let us  know if ongoing to order barium swallow study, consider return to GI.  Follow up plan: No follow-ups on file.  Claire Crick, MD

## 2023-05-05 NOTE — Assessment & Plan Note (Addendum)
 Anticipate post-infectious cough that is slowly improving. In lung cancer hx, check CXR today.  Rx tessalon perls.  No signs of ongoing bacterial infection.

## 2023-05-05 NOTE — Patient Instructions (Addendum)
 Chest xray today  I think you had chest wall strain after all the coughing.  Use heating pad covered in a towel to chest wall, may try topical diclofenac (voltaren) over the counter anti inflammatory gel  For choking with swallowing liquids 0- let us  know if ongoing to order barium swallow study, consider return to GI.

## 2023-05-05 NOTE — Assessment & Plan Note (Signed)
 Reproducible chest wall pain on left side of chest.  Anticipate rib strain after recent respiratory illness with prolonged cough.  Reassurance provided, supportive measures reviewed - heating pad, topical voltaren, tylenol. Update if not improving with treatment as expected.

## 2023-05-05 NOTE — Assessment & Plan Note (Signed)
 Endorses intermittent liquid dysphagia.  This could increase risk of aspiration pneumonia To let me know if ongoing to order barium swallow study

## 2023-05-15 ENCOUNTER — Other Ambulatory Visit: Payer: Self-pay | Admitting: Family Medicine

## 2023-05-26 ENCOUNTER — Ambulatory Visit
Admission: RE | Admit: 2023-05-26 | Discharge: 2023-05-26 | Disposition: A | Discharge status: Home / Self Care | Payer: Federal, State, Local not specified - PPO | Source: Ambulatory Visit | Attending: Oncology | Admitting: Oncology

## 2023-05-26 DIAGNOSIS — C3492 Malignant neoplasm of unspecified part of left bronchus or lung: Secondary | ICD-10-CM | POA: Diagnosis present

## 2023-06-16 ENCOUNTER — Inpatient Hospital Stay: Payer: Federal, State, Local not specified - PPO | Attending: Oncology | Admitting: Oncology

## 2023-06-16 ENCOUNTER — Encounter: Payer: Self-pay | Admitting: Oncology

## 2023-06-16 VITALS — BP 125/82 | HR 87 | Temp 96.9°F | Resp 20 | Ht 64.0 in | Wt 125.9 lb

## 2023-06-16 DIAGNOSIS — Z85118 Personal history of other malignant neoplasm of bronchus and lung: Secondary | ICD-10-CM

## 2023-06-16 DIAGNOSIS — Z87891 Personal history of nicotine dependence: Secondary | ICD-10-CM | POA: Insufficient documentation

## 2023-06-16 DIAGNOSIS — Z08 Encounter for follow-up examination after completed treatment for malignant neoplasm: Secondary | ICD-10-CM | POA: Diagnosis not present

## 2023-06-16 MED ORDER — BENZONATATE 100 MG PO CAPS: 100.0000 mg | ORAL_CAPSULE | Freq: Three times a day (TID) | ORAL | 0 refills | Status: DC | PRN

## 2023-06-16 NOTE — Progress Notes (Signed)
 Hematology/Oncology Consult note [facility]  Telephone:([phone] [phone]  Patient Care Team: Claire Crick, MD as PCP - General (Family Medicine) Drake Gens, RN as Oncology Nurse Navigator Avonne Boettcher, MD as Consulting Physician (Oncology)   Name of the patient: Cassie Shaw  [id number]  [birth year]   Date of visit: 06/16/23  Diagnosis- history of lung cancer in 2021 s/p resection     Chief complaint/ Reason for visit-discuss CT scan results and further management  Heme/Onc history:  Patient is a 78 year old female with history of irritable bowel syndrome, peripheral neuropathy, tobacco abuse among other medical problems.  She underwent a low-dose screening CT chest on October 2021 which showed a lung nodule in the left lower lobe 11.3 mm with a solid component of 6.6 mm.  There was improvement in the size of this nodule 3 months later.  A PET CT scan showed mild metabolic activity within the cavitary lesion in the left lower lobe.  No evidence of mediastinal or hilar adenopathy or distant metastatic disease.  Patient was seen by Dr. Luna Salinas and underwent left lower lobe wedge resection on 03/15/2020.   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  No evidence of lymphovascular or visceral pleural invasion.  10 lymph nodes were negative for malignancy.  p T1b pN0  Interval history-patient reports occasional cough and mucoid expectoration for the last couple of weeks.  ECOG PS- 1 Pain scale- 0   Review of systems- Review of Systems  Constitutional:  Negative for chills, fever, malaise/fatigue and weight loss.  HENT:  Negative for congestion, ear discharge and nosebleeds.   Eyes:  Negative for blurred vision.  Respiratory:  Positive for cough. Negative for hemoptysis, sputum production, shortness of breath and wheezing.   Cardiovascular:  Negative for chest pain, palpitations, orthopnea and  claudication.  Gastrointestinal:  Negative for abdominal pain, blood in stool, constipation, diarrhea, heartburn, melena, nausea and vomiting.  Genitourinary:  Negative for dysuria, flank pain, frequency, hematuria and urgency.  Musculoskeletal:  Negative for back pain, joint pain and myalgias.  Skin:  Negative for rash.  Neurological:  Negative for dizziness, tingling, focal weakness, seizures, weakness and headaches.  Endo/Heme/Allergies:  Does not bruise/bleed easily.  Psychiatric/Behavioral:  Negative for depression and suicidal ideas. The patient does not have insomnia.       No Known Allergies   Past Medical History:  Diagnosis Date   Abnormal weight loss 02/26/2023   Arthritis    B12 deficiency    COPD (chronic obstructive pulmonary disease) (HCC)    Ex-smoker    Fibrocystic breast disease    Fibromyalgia    History of chicken pox    History of colonic polyps 02/26/2023   History of depression    and anxiety   HLD (hyperlipidemia)    IBS (irritable bowel syndrome)    Osteoporosis 2015   on/off fosamax  DEXA T -3.5 spine, -2.5 hip   Peripheral neuropathy    Retinal detachment      Past Surgical History:  Procedure Laterality Date   CATARACT EXTRACTION Bilateral 2014   with lens implant   COLONOSCOPY  12/2005   WNL ([facility])   COLONOSCOPY  08/2017   5 TAs, rpt 5 yrs ([facility])   DEXA  01/2013   T -3.5 spine, -2.5 hip   INTERCOSTAL NERVE BLOCK Left 03/15/2020   Procedure: INTERCOSTAL NERVE BLOCK;  Surgeon: Zelphia Higashi, MD;  Location: [facility] OR;  Service: Thoracic;  Laterality:  Left;   LYMPH NODE DISSECTION Bilateral 03/15/2020   Procedure: LYMPH NODE DISSECTION;  Surgeon: Zelphia Higashi, MD;  Location: [facility] OR;  Service: Thoracic;  Laterality: Bilateral;   PARTIAL HYSTERECTOMY  1975   for heavy bleeding, ovaries remained   RETINAL DETACHMENT SURGERY  2014   Dr Seward Dao   VIDEO ASSISTED THORACOSCOPY (VATS)/WEDGE RESECTION Left 02/2020   Robotic left  video-assisted thoracoscopy, L lower lobe wedge resection, L lower lobectomy and LN dissection Luna Salinas)    Social History   Socioeconomic History   Marital status: Married    Spouse name: Not on file   Number of children: Not on file   Years of education: Not on file   Highest education level: Not on file  Occupational History   Not on file  Tobacco Use   Smoking status: Former    Current packs/day: 0.00    Average packs/day: 1 pack/day for 46.0 years (46.0 ttl pk-yrs)    Types: Cigarettes    Start date: 01/20/1958    Quit date: 01/21/2004    Years since quitting: 19.4    Passive exposure: Past   Smokeless tobacco: Never  Vaping Use   Vaping status: Never Used  Substance and Sexual Activity   Alcohol use: No    Alcohol/week: 0.0 standard drinks of alcohol   Drug use: No   Sexual activity: Not on file  Other Topics Concern   Not on file  Social History Narrative   Lives with husband Cassie Shaw), 2 dogs and a bird   Occupation: retired, was Education officer, environmental   Activity: no regular exercise   Diet: good water, fruits/vegetables daily   Social Drivers of Corporate investment banker Strain: Low Risk  (11/16/2022)   Overall Financial Resource Strain (CARDIA)    Difficulty of Paying Living Expenses: Not hard at all  Food Insecurity: No Food Insecurity (11/16/2022)   Hunger Vital Sign    Worried About Running Out of Food in the Last Year: Never true    Ran Out of Food in the Last Year: Never true  Transportation Needs: Patient Declined (11/16/2022)   PRAPARE - Transportation    Lack of Transportation (Medical): Patient declined    Lack of Transportation (Non-Medical): Patient declined  Physical Activity: Insufficiently Active (11/16/2022)   Exercise Vital Sign    Days of Exercise per Week: 3 days    Minutes of Exercise per Session: 10 min  Stress: Patient Declined (11/16/2022)   [employer] of Occupational Health - Occupational Stress Questionnaire    Feeling of Stress  : Patient declined  Social Connections: Unknown (11/16/2022)   Social Connection and Isolation Panel [NHANES]    Frequency of Communication with Friends and Family: Patient declined    Frequency of Social Gatherings with Friends and Family: Patient declined    Attends Religious Services: Patient declined    Database administrator or Organizations: Not on file    Attends Banker Meetings: Not on file    Marital Status: Patient declined  Intimate Partner Violence: Not on file    Family History  Problem Relation Age of Onset   Arthritis Mother    Cancer Mother        smoker   Cancer Sister        bone   Diabetes Maternal Uncle    Cancer Maternal Grandfather    Stroke Neg Hx    CAD Neg Hx    Breast cancer Neg Hx  Current Outpatient Medications:    acetaminophen  (TYLENOL ) 325 MG tablet, Take 650 mg by mouth every 6 (six) hours as needed., Disp: , Rfl:    alendronate  (FOSAMAX ) 70 MG tablet, Take 1 tablet (70 mg total) by mouth every 7 (seven) days. Take with a full glass of water on an empty stomach., Disp: 4 tablet, Rfl: 11   Cholecalciferol (VITAMIN D3) 1000 units CAPS, Take 1 capsule (1,000 Units total) by mouth daily., Disp: 30 capsule, Rfl:    gabapentin  (NEURONTIN ) 600 MG tablet, Take 0.5 tablets (300 mg total) by mouth in the morning AND 1 tablet (600 mg total) at bedtime., Disp: 135 tablet, Rfl: 4   ibuprofen (ADVIL) 200 MG tablet, Take 200 mg by mouth 2 (two) times daily as needed for moderate pain., Disp: , Rfl:    ipratropium (ATROVENT ) 0.06 % nasal spray, Place 2 sprays into both nostrils 4 (four) times daily., Disp: 15 mL, Rfl: 1   lovastatin  (MEVACOR ) 40 MG tablet, Take 1 tablet (40 mg total) by mouth at bedtime., Disp: 90 tablet, Rfl: 4   melatonin 1 MG TABS tablet, Take 3 mg by mouth at bedtime., Disp: , Rfl:    Multiple Vitamin (MULTIVITAMIN) tablet, Take 1 tablet by mouth daily., Disp: , Rfl:    nortriptyline  (PAMELOR ) 25 MG capsule, Take 1 capsule  (25 mg total) by mouth at bedtime., Disp: 90 capsule, Rfl: 4   polyethylene glycol powder (GLYCOLAX/MIRALAX) 17 GM/SCOOP powder, Take 8.5 g by mouth daily., Disp: , Rfl:    benzonatate  (TESSALON ) 100 MG capsule, Take 1 capsule (100 mg total) by mouth 3 (three) times daily as needed for cough., Disp: 30 capsule, Rfl: 0  Physical exam:  Vitals:   06/16/23 1010  BP: 125/82  Pulse: 87  Resp: 20  Temp: (!) 96.9 F (36.1 C)  TempSrc: Tympanic  SpO2: 99%  Weight: 125 lb 14.4 oz (57.1 kg)  Height: 5\' 4"  (1.626 m)   Physical Exam Cardiovascular:     Rate and Rhythm: Normal rate and regular rhythm.     Heart sounds: Normal heart sounds.  Pulmonary:     Effort: Pulmonary effort is normal.     Breath sounds: Normal breath sounds.  Skin:    General: Skin is warm and dry.  Neurological:     Mental Status: She is alert and oriented to person, place, and time.      I have personally reviewed labs listed below:    Latest Ref Rng & Units 03/10/2023    9:54 AM  CMP  Creatinine 0.44 - 1.00 mg/dL 3.08       Latest Ref Rng & Units 11/10/2022    9:55 AM  CBC  WBC 4.0 - 10.5 K/uL 6.2   Hemoglobin 12.0 - 15.0 g/dL 65.7   Hematocrit 84.6 - 46.0 % 42.9   Platelets 150.0 - 400.0 K/uL 226.0    I have personally reviewed Radiology images listed below: No images are attached to the encounter.  CT Chest Wo Contrast Result Date: 06/06/2023 CLINICAL DATA:  Lung cancer surveillance. History of left lower lobe lobectomy. * Tracking Code: BO * EXAM: CT CHEST WITHOUT CONTRAST TECHNIQUE: Multidetector CT imaging of the chest was performed following the standard protocol without IV contrast. RADIATION DOSE REDUCTION: This exam was performed according to the departmental dose-optimization program which includes automated exposure control, adjustment of the mA and/or kV according to patient size and/or use of iterative reconstruction technique. COMPARISON:  Multiple prior imaging studies. The most recent  chest CT is 11/18/2022 FINDINGS: Cardiovascular: The heart is normal in size. No pericardial effusion. Stable tortuosity and calcification of the thoracic aorta but no aneurysm. Stable branch vessel calcifications including scattered three-vessel coronary artery calcifications. Mediastinum/Nodes: Small scattered mediastinal and hilar lymph nodes, unchanged. No mass or adenopathy. The esophagus is unremarkable. The thyroid  gland is normal. Lungs/Pleura: Stable surgical changes from prior left lower lobe lobectomy with associated loss of volume in the left hemithorax. Stable underlying emphysematous changes and pulmonary scarring. No worrisome pulmonary lesions or new pulmonary nodules to suggest metastatic disease. No pleural effusions or pleural nodules. The central tracheobronchial tree is unremarkable. Upper Abdomen: No significant upper abdominal findings. Stable left renal cyst. No hepatic or adrenal gland lesions. Stable aortic calcifications. Musculoskeletal: Stable scoliosis and exaggerated thoracic kyphosis and degenerative changes. No worrisome lytic or sclerotic bone lesions. IMPRESSION: 1. Stable surgical changes from prior left lower lobe lobectomy. No findings for recurrent tumor, locoregional adenopathy or metastatic disease. 2. Stable emphysematous changes and pulmonary scarring. 3. Stable atherosclerotic calcifications involving the thoracic and abdominal aorta and branch vessels including the coronary arteries. Aortic Atherosclerosis (ICD10-I70.0) and Emphysema (ICD10-J43.9). Electronically Signed   By: Marrian Siva M.D.   On: 06/06/2023 12:50     Assessment and plan- Patient is a 78 y.o. 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  This is a routine surveillance visit for lung cancer   I have reviewed CT chest images independently and discussed findings with the patient which does not show any evidence of recurrent or progressive disease.  She is now  more than 3 years out from her lung cancer surgery in February 2022.  I will move to yearly scans at this time.  CT chest without contrast in 1 year and I will see her 2 weeks after scan  With regards to her cough: I have given her a prescription for Tessalon  Perles but is symptoms continue she will need to discuss that with her primary care doctor. 1. Encounter for follow-up surveillance of lung cancer      Dr. Seretha Dance, MD, MPH [facility] [phone] 06/16/2023 11:15 AM

## 2023-06-16 NOTE — Progress Notes (Signed)
 CT chest 05/26/23.  Needs refill on tessalon , pended. She states she is having a cough and lots of mucus.

## 2023-07-13 ENCOUNTER — Other Ambulatory Visit: Payer: Self-pay | Admitting: Oncology

## 2023-07-14 ENCOUNTER — Other Ambulatory Visit: Payer: Self-pay | Admitting: Oncology

## 2023-07-14 NOTE — Telephone Encounter (Signed)
 She needs to speak to her pcp regarding continued refills

## 2023-07-14 NOTE — Telephone Encounter (Addendum)
 Per Dr. Melanee She needs to speak to her pcp regarding continued refills.  Outbound call to patient; informed of above.  Patient said she thought they sent it to her PCP and not us ; she will follow up with PCP.

## 2023-07-21 ENCOUNTER — Other Ambulatory Visit: Payer: Self-pay | Admitting: Family Medicine

## 2023-07-21 DIAGNOSIS — E785 Hyperlipidemia, unspecified: Secondary | ICD-10-CM

## 2023-07-21 NOTE — Telephone Encounter (Signed)
 Too soon. Rx sent 11/17/22, #90/4 refills to [facility], [place].  Request denied.

## 2023-08-26 ENCOUNTER — Other Ambulatory Visit: Payer: Self-pay | Admitting: Family Medicine

## 2023-08-26 DIAGNOSIS — E785 Hyperlipidemia, unspecified: Secondary | ICD-10-CM

## 2023-08-26 NOTE — Telephone Encounter (Signed)
 E-scribed refill  Plz schedule CPE and fasting labs (no food/drink- except water and/or blk coffee 5 hrs prior) for additional refills.

## 2023-08-26 NOTE — Telephone Encounter (Signed)
 Lvm and sent MyChart message to schedule CPE and prior fasting labs

## 2023-09-22 ENCOUNTER — Ambulatory Visit: Payer: Self-pay | Admitting: *Deleted

## 2023-09-22 NOTE — Telephone Encounter (Signed)
 FYI Only or Action Required?: Action required by provider: request for appointment, update on patient condition, and only wants appt with PCP.  Patient was last seen in primary care on 05/05/2023 by Rilla Baller, MD.  Called Nurse Triage reporting Cough.  Symptoms began several months ago.  Interventions attempted: Rest, hydration, or home remedies.  Symptoms are: gradually worsening.  Triage Disposition: See HCP Within 4 Hours (Or PCP Triage)  Patient/caregiver understands and will follow disposition?: No, wishes to speak with PCP             Copied from CRM #[id number]. Topic: Clinical - Red Word Triage >> Sep 22, 2023  4:21 PM Cassie Shaw wrote: Red Word that prompted transfer to Nurse Triage: Patient is coughing up green  phlegm. The patient has had  the cough for several months. Reason for Disposition  [1] MILD difficulty breathing (e.g., minimal/no SOB at rest, SOB with walking, pulse < 100) AND [2] still present when not coughing  Answer Assessment - Initial Assessment Questions No available appt with PCP until 10/05/23. Patient's husband does not want pt to see any other provider due to hx lung CA and patient has had sx and treatments for months and can not get rid of sx. Recommended UC / ED if sx worsen. Please advise. Patient husband would like a call back.      1. ONSET: When did the cough begin?      Several months per husband on DPR 2. SEVERITY: How bad is the cough today?      Coughing up green phlegm 3. SPUTUM: Describe the color of your sputum (e.g., none, dry cough; clear, white, yellow, green)     Green  4. HEMOPTYSIS: Are you coughing up any blood? If Yes, ask: How much? (e.g., flecks, streaks, tablespoons, etc.)     Na  5. DIFFICULTY BREATHING: Are you having difficulty breathing? If Yes, ask: How bad is it? (e.g., mild, moderate, severe)      Not severe 6. FEVER: Do you have a fever? If Yes, ask: What is your temperature, how was  it measured, and when did it start?     Na  7. CARDIAC HISTORY: Do you have any history of heart disease? (e.g., heart attack, congestive heart failure)      See hx  8. LUNG HISTORY: Do you have any history of lung disease?  (e.g., pulmonary embolus, asthma, emphysema)     Hx lung CA 9. PE RISK FACTORS: Do you have a history of blood clots? (or: recent major surgery, recent prolonged travel, bedridden)     Na  10. OTHER SYMPTOMS: Do you have any other symptoms? (e.g., runny nose, wheezing, chest pain)       Coughing with green phlegm , SOB at times with exertion. Chest discomfort at times husband denies now.  11. PREGNANCY: Is there any chance you are pregnant? When was your last menstrual period?       na 12. TRAVEL: Have you traveled out of the country in the last month? (e.g., travel history, exposures)       na  Protocols used: Cough - Acute Productive-A-AH

## 2023-09-23 NOTE — Telephone Encounter (Signed)
 Please schedule with me Friday at 10am (that pt may cancel appt) or 4pm.

## 2023-09-24 NOTE — Telephone Encounter (Signed)
 Pt added on 09/25/23 at 4:00 w/ Dr JUDITHANN Sermon with pt's husband, Vaughn (on dpr), notifying him of appt above. Asked that pt check in with front desk by 3:45. He verbalizes understanding and will inform pt.

## 2023-09-24 NOTE — Telephone Encounter (Signed)
 Please see yesterdays' triage note.  Please schedule appt tomorrow at 4:30pm.

## 2023-09-24 NOTE — Telephone Encounter (Addendum)
 Want to offer OV tomorrow at 4:00 for constant cough?

## 2023-09-24 NOTE — Telephone Encounter (Signed)
 Called patient added to 400pm asked to check in at 330.

## 2023-09-25 ENCOUNTER — Ambulatory Visit: Admitting: Family Medicine

## 2023-09-25 ENCOUNTER — Encounter: Payer: Self-pay | Admitting: Family Medicine

## 2023-09-25 VITALS — BP 110/70 | HR 93 | Temp 98.5°F | Ht 64.0 in | Wt 127.1 lb

## 2023-09-25 DIAGNOSIS — Z85118 Personal history of other malignant neoplasm of bronchus and lung: Secondary | ICD-10-CM

## 2023-09-25 DIAGNOSIS — R053 Chronic cough: Secondary | ICD-10-CM | POA: Diagnosis not present

## 2023-09-25 DIAGNOSIS — Z23 Encounter for immunization: Secondary | ICD-10-CM

## 2023-09-25 MED ORDER — AZITHROMYCIN 250 MG PO TABS: ORAL_TABLET | ORAL | 0 refills | Status: AC

## 2023-09-25 MED ORDER — BENZONATATE 100 MG PO CAPS: 100.0000 mg | ORAL_CAPSULE | Freq: Three times a day (TID) | ORAL | 3 refills | Status: AC | PRN

## 2023-09-25 NOTE — Patient Instructions (Signed)
 Lungs overall sound good today.  Given ongoing persistent cough, I recommend treating with azithromycin  antibiotic sent to pharmacy to cover atypical bronchitis infection. I have also refilled tessalon  perls to use. Let us  know if not improving after antibiotic treatment.

## 2023-09-25 NOTE — Assessment & Plan Note (Addendum)
 Pt states present since ~03/2023, did have reassuring CXR 04/2023 and noncontrasted chest CT 05/2023.  Reassuring exam today.  Given duration of symptoms, will cover for atypical infection with azithromycin  course.  Will refill tessalon  perls to use PRN. Update if ongoing symptoms despite zpack treatment, consider re-imaging.

## 2023-09-25 NOTE — Progress Notes (Signed)
 Ph: [phone] Fax: [phone]   Patient ID: Cassie Shaw, female    DOB: 08-07-1945, 78 y.o.   MRN: [id number]  This visit was conducted in person.  BP 110/70   Pulse 93   Temp 98.5 F (36.9 C) (Oral)   Ht 5' 4 (1.626 m)   Wt 127 lb 2 oz (57.7 kg)   SpO2 98%   BMI 21.82 kg/m    CC: ongoing cough Subjective:   HPI: Cassie Shaw is a 78 y.o. female presenting on 09/25/2023 for Cough (Pt here for cough. Had cough for months. Mid Back area still hurting.)   See prior note for details. Seen 05/05/2023 with subacute cough.  At that time thought post-infectious cough, treated with tessalon  perls. CXR was clear.   Notes ongoing cough productive of green colored mucous. This has been ongoing for months. She notes she stays short winded, worse with exertion.  No fevers/chills, wheezing,  Tessalon  perls do help.  Swallowing is doing ok.   H/o lung cancer LLL dx 10/2019 s/p LLL wedge resection 02/2020.  Regularly sees oncology in follow up, last saw Dr Melanee 05/2023 with reassuring evaluation at that time (CT chest).   Notes thoracic back pain present for months to years. Denies inciting trauma/injury or falls. H/o OP - doing better with regular fosamax  use.      Relevant past medical, surgical, family and social history reviewed and updated as indicated. Interim medical history since our last visit reviewed. Allergies and medications reviewed and updated. Outpatient Medications Prior to Visit  Medication Sig Dispense Refill   acetaminophen  (TYLENOL ) 325 MG tablet Take 650 mg by mouth every 6 (six) hours as needed.     alendronate  (FOSAMAX ) 70 MG tablet Take 1 tablet (70 mg total) by mouth every 7 (seven) days. Take with a full glass of water on an empty stomach. 4 tablet 11   Cholecalciferol (VITAMIN D3) 1000 units CAPS Take 1 capsule (1,000 Units total) by mouth daily. 30 capsule    gabapentin  (NEURONTIN ) 600 MG tablet Take 0.5 tablets (300 mg total) by mouth in the morning  AND 1 tablet (600 mg total) at bedtime. 135 tablet 4   ibuprofen (ADVIL) 200 MG tablet Take 200 mg by mouth 2 (two) times daily as needed for moderate pain.     lovastatin  (MEVACOR ) 40 MG tablet TAKE 1 TABLET BY MOUTH EVERYDAY AT BEDTIME 90 tablet 0   melatonin 1 MG TABS tablet Take 3 mg by mouth at bedtime.     Multiple Vitamin (MULTIVITAMIN) tablet Take 1 tablet by mouth daily.     nortriptyline  (PAMELOR ) 25 MG capsule Take 1 capsule (25 mg total) by mouth at bedtime. 90 capsule 4   polyethylene glycol powder (GLYCOLAX/MIRALAX) 17 GM/SCOOP powder Take 8.5 g by mouth daily. (Patient not taking: Reported on 09/25/2023)     benzonatate  (TESSALON ) 100 MG capsule Take 1 capsule (100 mg total) by mouth 3 (three) times daily as needed for cough. (Patient not taking: Reported on 09/25/2023) 30 capsule 0   ipratropium (ATROVENT ) 0.06 % nasal spray Place 2 sprays into both nostrils 4 (four) times daily. (Patient not taking: Reported on 09/25/2023) 15 mL 1   No facility-administered medications prior to visit.     Per HPI unless specifically indicated in ROS section below Review of Systems  Objective:  BP 110/70   Pulse 93   Temp 98.5 F (36.9 C) (Oral)   Ht 5' 4 (1.626 m)   Wt  127 lb 2 oz (57.7 kg)   SpO2 98%   BMI 21.82 kg/m   Wt Readings from Last 3 Encounters:  09/25/23 127 lb 2 oz (57.7 kg)  06/16/23 125 lb 14.4 oz (57.1 kg)  05/05/23 124 lb 6 oz (56.4 kg)      Physical Exam Vitals and nursing note reviewed.  Constitutional:      Appearance: Normal appearance. She is not ill-appearing.  HENT:     Head: Normocephalic and atraumatic.     Right Ear: Tympanic membrane, ear canal and external ear normal. There is no impacted cerumen.     Left Ear: Tympanic membrane, ear canal and external ear normal. There is no impacted cerumen.     Nose:     Comments: Wearing mask    Mouth/Throat:     Mouth: Mucous membranes are moist.     Pharynx: Oropharynx is clear. No oropharyngeal exudate or  posterior oropharyngeal erythema.     Tonsils: No tonsillar exudate.     Comments: Posterior oropharyngeal drainage present Eyes:     Extraocular Movements: Extraocular movements intact.     Conjunctiva/sclera: Conjunctivae normal.     Pupils: Pupils are equal, round, and reactive to light.  Cardiovascular:     Rate and Rhythm: Normal rate and regular rhythm.     Pulses: Normal pulses.     Heart sounds: Normal heart sounds. No murmur heard. Pulmonary:     Effort: Pulmonary effort is normal. No respiratory distress.     Breath sounds: No wheezing, rhonchi or rales.     Comments: LLL crackles and diminished breath sounds laterally at site of prior partial lung wedge resection Musculoskeletal:        General: Normal range of motion.     Cervical back: Normal range of motion and neck supple.     Comments:  No midline thoracic spine or paraspinous thoracic mm tenderness to palpation  Lymphadenopathy:     Head:     Right side of head: No submental, submandibular, tonsillar, preauricular or posterior auricular adenopathy.     Left side of head: No submental, submandibular, tonsillar, preauricular or posterior auricular adenopathy.     Cervical: No cervical adenopathy.     Right cervical: No superficial cervical adenopathy.    Left cervical: No superficial cervical adenopathy.     Upper Body:     Right upper body: No supraclavicular adenopathy.     Left upper body: No supraclavicular adenopathy.  Skin:    Findings: No rash.  Neurological:     Mental Status: She is alert.  Psychiatric:        Mood and Affect: Mood normal.        Behavior: Behavior normal.       Results for orders placed or performed during the hospital encounter of 03/10/23  I-STAT creatinine   Collection Time: 03/10/23  9:54 AM  Result Value Ref Range   Creatinine, Ser 1.00 0.44 - 1.00 mg/dL    Assessment & Plan:   Problem List Items Addressed This Visit     History of lung cancer in adulthood   Appreciate  onc care Latest CT chest reassuring 05/2023. Has been spaced out to q1 year CT scans.       Chronic cough - Primary   Pt states present since ~03/2023, did have reassuring CXR 04/2023 and noncontrasted chest CT 05/2023.  Reassuring exam today.  Given duration of symptoms, will cover for atypical infection with azithromycin  course.  Will refill  tessalon  perls to use PRN. Update if ongoing symptoms despite zpack treatment, consider re-imaging.       Other Visit Diagnoses       Encounter for immunization       Relevant Orders   Flu vaccine HIGH DOSE PF(Fluzone Trivalent) (Completed)        Meds ordered this encounter  Medications   benzonatate  (TESSALON ) 100 MG capsule    Sig: Take 1 capsule (100 mg total) by mouth 3 (three) times daily as needed for cough.    Dispense:  30 capsule    Refill:  3   azithromycin  (ZITHROMAX ) 250 MG tablet    Sig: Take 2 tablets on day 1, then 1 tablet daily on days 2 through 5    Dispense:  6 tablet    Refill:  0    Orders Placed This Encounter  Procedures   Flu vaccine HIGH DOSE PF(Fluzone Trivalent)    Patient Instructions  Lungs overall sound good today.  Given ongoing persistent cough, I recommend treating with azithromycin  antibiotic sent to pharmacy to cover atypical bronchitis infection. I have also refilled tessalon  perls to use. Let us  know if not improving after antibiotic treatment.   Follow up plan: No follow-ups on file.  Anton Blas, MD

## 2023-09-25 NOTE — Assessment & Plan Note (Addendum)
 Appreciate onc care Latest CT chest reassuring 05/2023. Has been spaced out to q1 year CT scans.

## 2023-10-27 ENCOUNTER — Telehealth: Payer: Self-pay

## 2023-10-27 NOTE — Telephone Encounter (Signed)
 Spoke with pt's husband, Vaughn (on dpr), scheduling annual fasting labs on 11/16/23 at 9:30.

## 2023-10-27 NOTE — Telephone Encounter (Signed)
 Copied from CRM #[id number]. Topic: Clinical - Request for Lab/Test Order >> Oct 27, 2023  9:37 AM [place] J wrote: Reason for CRM: Patient would like fasting labs prior to her annual physical on 11/03.

## 2023-11-02 ENCOUNTER — Other Ambulatory Visit: Payer: Self-pay | Admitting: Family Medicine

## 2023-11-02 DIAGNOSIS — Z1231 Encounter for screening mammogram for malignant neoplasm of breast: Secondary | ICD-10-CM

## 2023-11-15 ENCOUNTER — Other Ambulatory Visit: Payer: Self-pay | Admitting: Family Medicine

## 2023-11-15 DIAGNOSIS — E785 Hyperlipidemia, unspecified: Secondary | ICD-10-CM

## 2023-11-15 DIAGNOSIS — M810 Age-related osteoporosis without current pathological fracture: Secondary | ICD-10-CM

## 2023-11-15 DIAGNOSIS — E538 Deficiency of other specified B group vitamins: Secondary | ICD-10-CM

## 2023-11-15 DIAGNOSIS — R7989 Other specified abnormal findings of blood chemistry: Secondary | ICD-10-CM

## 2023-11-16 ENCOUNTER — Other Ambulatory Visit (INDEPENDENT_AMBULATORY_CARE_PROVIDER_SITE_OTHER)

## 2023-11-16 DIAGNOSIS — M810 Age-related osteoporosis without current pathological fracture: Secondary | ICD-10-CM

## 2023-11-16 DIAGNOSIS — R7989 Other specified abnormal findings of blood chemistry: Secondary | ICD-10-CM

## 2023-11-16 DIAGNOSIS — E785 Hyperlipidemia, unspecified: Secondary | ICD-10-CM | POA: Diagnosis not present

## 2023-11-16 DIAGNOSIS — E538 Deficiency of other specified B group vitamins: Secondary | ICD-10-CM | POA: Diagnosis not present

## 2023-11-16 LAB — COMPREHENSIVE METABOLIC PANEL WITH GFR
ALT: 15 U/L (ref 0–35)
AST: 20 U/L (ref 0–37)
Albumin: 4.3 g/dL (ref 3.5–5.2)
Alkaline Phosphatase: 28 U/L — ABNORMAL LOW (ref 39–117)
BUN: 12 mg/dL (ref 6–23)
CO2: 29 meq/L (ref 19–32)
Calcium: 9.4 mg/dL (ref 8.4–10.5)
Chloride: 102 meq/L (ref 96–112)
Creatinine, Ser: 0.73 mg/dL (ref 0.40–1.20)
GFR: 78.23 mL/min (ref >60.00)
Glucose, Bld: 87 mg/dL (ref 70–99)
Potassium: 4.5 meq/L (ref 3.5–5.1)
Sodium: 140 meq/L (ref 135–145)
Total Bilirubin: 0.4 mg/dL (ref 0.2–1.2)
Total Protein: 7.9 g/dL (ref 6.0–8.3)

## 2023-11-16 LAB — VITAMIN B12: Vitamin B-12: 1360 pg/mL — ABNORMAL HIGH (ref 211–911)

## 2023-11-16 LAB — LIPID PANEL
Cholesterol: 199 mg/dL (ref 0–200)
HDL: 70.1 mg/dL (ref >39.00)
LDL Cholesterol: 95 mg/dL (ref 0–99)
NonHDL: 129.05
Total CHOL/HDL Ratio: 3
Triglycerides: 170 mg/dL — ABNORMAL HIGH (ref 0.0–149.0)
VLDL: 34 mg/dL (ref 0.0–40.0)

## 2023-11-16 LAB — VITAMIN D 25 HYDROXY (VIT D DEFICIENCY, FRACTURES): VITD: 53.5 ng/mL (ref 30.00–100.00)

## 2023-11-16 LAB — TSH: TSH: 1.85 u[IU]/mL (ref 0.35–5.50)

## 2023-11-16 LAB — T4, FREE: Free T4: 0.69 ng/dL (ref 0.60–1.60)

## 2023-11-20 ENCOUNTER — Ambulatory Visit: Payer: Self-pay | Admitting: Family Medicine

## 2023-11-22 ENCOUNTER — Other Ambulatory Visit: Payer: Self-pay | Admitting: Family Medicine

## 2023-11-22 DIAGNOSIS — E785 Hyperlipidemia, unspecified: Secondary | ICD-10-CM

## 2023-11-23 ENCOUNTER — Encounter: Payer: Self-pay | Admitting: Family Medicine

## 2023-11-23 ENCOUNTER — Ambulatory Visit (INDEPENDENT_AMBULATORY_CARE_PROVIDER_SITE_OTHER): Admitting: Family Medicine

## 2023-11-23 VITALS — BP 112/78 | HR 87 | Temp 98.2°F | Ht 63.75 in | Wt 126.1 lb

## 2023-11-23 DIAGNOSIS — J449 Chronic obstructive pulmonary disease, unspecified: Secondary | ICD-10-CM | POA: Diagnosis not present

## 2023-11-23 DIAGNOSIS — Z85118 Personal history of other malignant neoplasm of bronchus and lung: Secondary | ICD-10-CM

## 2023-11-23 DIAGNOSIS — Z7189 Other specified counseling: Secondary | ICD-10-CM | POA: Diagnosis not present

## 2023-11-23 DIAGNOSIS — Z0000 Encounter for general adult medical examination without abnormal findings: Secondary | ICD-10-CM | POA: Diagnosis not present

## 2023-11-23 DIAGNOSIS — R053 Chronic cough: Secondary | ICD-10-CM

## 2023-11-23 DIAGNOSIS — R7989 Other specified abnormal findings of blood chemistry: Secondary | ICD-10-CM

## 2023-11-23 DIAGNOSIS — E785 Hyperlipidemia, unspecified: Secondary | ICD-10-CM

## 2023-11-23 DIAGNOSIS — M810 Age-related osteoporosis without current pathological fracture: Secondary | ICD-10-CM | POA: Diagnosis not present

## 2023-11-23 DIAGNOSIS — R0789 Other chest pain: Secondary | ICD-10-CM

## 2023-11-23 DIAGNOSIS — E538 Deficiency of other specified B group vitamins: Secondary | ICD-10-CM

## 2023-11-23 DIAGNOSIS — K5909 Other constipation: Secondary | ICD-10-CM

## 2023-11-23 MED ORDER — INCRUSE ELLIPTA 62.5 MCG/ACT IN AEPB: 1.0000 | INHALATION_SPRAY | Freq: Every day | RESPIRATORY_TRACT | 11 refills | Status: AC

## 2023-11-23 NOTE — Progress Notes (Unsigned)
 Ph: [phone] Fax: [phone]   Patient ID: Cassie Shaw, female    DOB: 1945-06-19, 78 y.o.   MRN: [id number]  This visit was conducted in person.  BP 112/78   Pulse 87   Temp 98.2 F (36.8 C) (Oral)   Ht 5' 3.75 (1.619 m)   Wt 126 lb 2 oz (57.2 kg)   SpO2 98%   BMI 21.82 kg/m    CC: CPE Subjective:   HPI: Cassie Shaw is a 77 y.o. female presenting on 11/23/2023 for Annual Exam   Does not have medicare.  No results found.  Flowsheet Row Office Visit from 11/23/2023 in [facility]  PHQ-2 Total Score 0       11/23/2023   10:46 AM 09/25/2023    4:15 PM 11/17/2022   12:22 PM 09/17/2022   10:08 AM 10/29/2021    3:26 PM  Fall Risk   Falls in the past year? 0 1 1 1  0  Number falls in past yr: 0 1 1 1  0  Injury with Fall? 0 0 0 0 0  Risk for fall due to : No Fall Risks Other (Comment)  History of fall(s) No Fall Risks  Follow up Falls evaluation completed Falls evaluation completed  Falls evaluation completed Falls evaluation completed      Data saved with a previous flowsheet row definition   H/o lung cancer s/p full excision, did not need adjuvant treatment or XRT.  VIDEO ASSISTED THORACOSCOPY (VATS)/WEDGE RESECTION LEFT 02/2020 - Robotic left video-assisted thoracoscopy, L lower lobe wedge resection, L lower lobectomy and LN dissection Arnaldo) Continues seeing oncology yearly ([facility]ao/Lauren Dasie).   Chronic cough - treated with azithromycin  09/2023 - with temporary improvement. Cough continues - she is managing with tessalon  perls. Mild dyspnea. Cough is not exertional, mild mucous production. Agrees to try daily inhaler.    Chronic constipation - saw GI tried LInzess  290mcg daily - this didn't help.    Preventative: COLONOSCOPY 08/2017 - 5 TAs, rpt 5 yrs ([facility]) - saw [facility], rec against repeat colonoscopy Well woman exam - s/p partial hysterectomy 1975 for heavy bleeding, ovaries remain. No pelvic pain or vaginal bleeding.   Mammogram 11/2022 Bir[facility]1 @ [place]  Lung cancer screening - see above, s/p treatment for stage Ia lung cancer.  DEXA 01/2013 T -3.5 spine, -2.5 hip  DEXA 03/2019 T -3.4 spine, -3.4 hip, -3.9 forearm - fosamax  started, however doesn't remember to take regularly.  DEXA 11/2021 - T -3.6 LFN, -4.4 forearm. Changed to prolia  (02/25/2022 and 09/25/2022). Trouble with insurance approval. She decided to return to weekly fosamax  - restarted 03/2022.  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  Flu shot - yearly COVID vaccine Pfizer 02/2019, 03/2019, booster 10/2019 Td 2007 Prevnar-13 - 06/2017 Pneumovax - 03/2020 Shingrix - 06/2017, 08/2017 Advanced directive discussion - would want daughter Apolinar Pouch to be HCPOA. Ok with temporary measure, doesn't want prolonged life support if terminal condition. Packet previously provided.  Seat belt use discussed Sunscreen use discussed. No changing moles on skin. Sees Dr Dela.  Smoking - ex smoker quit remotely  Alcohol  - rare Dentist - yearly - has partial upper and lower dentures Eye exam in [place] - not recently  Bladder - no incontinence  Bowel - chronic constipation managed with activia and miralax and laxatives PRN. Linzess  was expensive and didn't really help.   Lives with husband Voncile), 2 dogs and a bird Occupation: retired, was [employer]  worker  Activity: regular treadmill use - walks 1 mi/day Diet: good water, fruits/vegetables daily     Relevant past medical, surgical, family and social history reviewed and updated as indicated. Interim medical history since our last visit reviewed. Allergies and medications reviewed and updated. Outpatient Medications Prior to Visit  Medication Sig Dispense Refill   acetaminophen  (TYLENOL ) 325 MG tablet Take 650 mg by mouth every 6 (six) hours as needed.     alendronate  (FOSAMAX ) 70 MG tablet Take 1 tablet (70 mg total) by mouth every 7 (seven) days. Take with a full glass of water on an empty stomach. 4  tablet 11   benzonatate  (TESSALON ) 100 MG capsule Take 1 capsule (100 mg total) by mouth 3 (three) times daily as needed for cough. 30 capsule 3   Cholecalciferol (VITAMIN D3) 1000 units CAPS Take 1 capsule (1,000 Units total) by mouth daily. 30 capsule    gabapentin  (NEURONTIN ) 600 MG tablet Take 0.5 tablets (300 mg total) by mouth in the morning AND 1 tablet (600 mg total) at bedtime. 135 tablet 4   ibuprofen (ADVIL) 200 MG tablet Take 200 mg by mouth 2 (two) times daily as needed for moderate pain.     lovastatin  (MEVACOR ) 40 MG tablet TAKE 1 TABLET BY MOUTH EVERYDAY AT BEDTIME 90 tablet 0   melatonin 1 MG TABS tablet Take 3 mg by mouth at bedtime.     Multiple Vitamin (MULTIVITAMIN) tablet Take 1 tablet by mouth daily.     nortriptyline  (PAMELOR ) 25 MG capsule Take 1 capsule (25 mg total) by mouth at bedtime. 90 capsule 4   polyethylene glycol powder (GLYCOLAX/MIRALAX) 17 GM/SCOOP powder Take 8.5 g by mouth daily.     No facility-administered medications prior to visit.     Per HPI unless specifically indicated in ROS section below Review of Systems  Constitutional:  Negative for activity change, appetite change, chills, fatigue, fever and unexpected weight change.  HENT:  Negative for hearing loss.   Eyes:  Negative for visual disturbance.  Respiratory:  Positive for cough. Negative for chest tightness, shortness of breath and wheezing.   Cardiovascular:  Positive for chest pain (occ substernal, not exertional or relieved by rest). Negative for palpitations and leg swelling.  Gastrointestinal:  Positive for constipation. Negative for abdominal distention, abdominal pain, blood in stool, diarrhea, nausea and vomiting.  Genitourinary:  Negative for difficulty urinating and hematuria.  Musculoskeletal:  Negative for arthralgias, myalgias and neck pain.  Skin:  Negative for rash.  Neurological:  Negative for dizziness, seizures, syncope and headaches.  Hematological:  Negative for  adenopathy. Does not bruise/bleed easily.  Psychiatric/Behavioral:  Negative for dysphoric mood. The patient is not nervous/anxious.     Objective:  BP 112/78   Pulse 87   Temp 98.2 F (36.8 C) (Oral)   Ht 5' 3.75 (1.619 m)   Wt 126 lb 2 oz (57.2 kg)   SpO2 98%   BMI 21.82 kg/m   Wt Readings from Last 3 Encounters:  11/23/23 126 lb 2 oz (57.2 kg)  09/25/23 127 lb 2 oz (57.7 kg)  06/16/23 125 lb 14.4 oz (57.1 kg)      Physical Exam Vitals and nursing note reviewed.  Constitutional:      Appearance: Normal appearance. She is not ill-appearing.  HENT:     Head: Normocephalic and atraumatic.     Right Ear: Tympanic membrane, ear canal and external ear normal. There is no impacted cerumen.     Left Ear:  Tympanic membrane, ear canal and external ear normal. There is no impacted cerumen.     Mouth/Throat:     Mouth: Mucous membranes are moist.     Pharynx: Oropharynx is clear. No oropharyngeal exudate or posterior oropharyngeal erythema.  Eyes:     General:        Right eye: No discharge.        Left eye: No discharge.     Extraocular Movements: Extraocular movements intact.     Conjunctiva/sclera: Conjunctivae normal.     Pupils: Pupils are equal, round, and reactive to light.  Neck:     Thyroid : No thyroid  mass or thyromegaly.     Vascular: No carotid bruit.  Cardiovascular:     Rate and Rhythm: Normal rate and regular rhythm.     Pulses: Normal pulses.     Heart sounds: Normal heart sounds. No murmur heard. Pulmonary:     Effort: Pulmonary effort is normal. No respiratory distress.     Breath sounds: Normal breath sounds. No wheezing, rhonchi or rales.  Abdominal:     General: Bowel sounds are normal. There is no distension.     Palpations: Abdomen is soft. There is no mass.     Tenderness: There is no abdominal tenderness. There is no guarding or rebound.     Hernia: No hernia is present.  Musculoskeletal:     Cervical back: Normal range of motion and neck supple.  No rigidity.     Right lower leg: No edema.     Left lower leg: No edema.  Lymphadenopathy:     Cervical: No cervical adenopathy.  Skin:    General: Skin is warm and dry.     Findings: No rash.  Neurological:     General: No focal deficit present.     Mental Status: She is alert. Mental status is at baseline.  Psychiatric:        Mood and Affect: Mood normal.        Behavior: Behavior normal.       Results for orders placed or performed in visit on 11/16/23  VITAMIN D  25 Hydroxy (Vit-D Deficiency, Fractures)   Collection Time: 11/16/23  9:25 AM  Result Value Ref Range   VITD 53.50 30.00 - 100.00 ng/mL  Vitamin B12   Collection Time: 11/16/23  9:25 AM  Result Value Ref Range   Vitamin B-12 1,360 (H) 211 - 911 pg/mL  T4, free   Collection Time: 11/16/23  9:25 AM  Result Value Ref Range   Free T4 0.69 0.60 - 1.60 ng/dL  TSH   Collection Time: 11/16/23  9:25 AM  Result Value Ref Range   TSH 1.85 0.35 - 5.50 uIU/mL  Comprehensive metabolic panel with GFR   Collection Time: 11/16/23  9:25 AM  Result Value Ref Range   Sodium 140 135 - 145 mEq/L   Potassium 4.5 3.5 - 5.1 mEq/L   Chloride 102 96 - 112 mEq/L   CO2 29 19 - 32 mEq/L   Glucose, Bld 87 70 - 99 mg/dL   BUN 12 6 - 23 mg/dL   Creatinine, Ser 9.26 0.40 - 1.20 mg/dL   Total Bilirubin 0.4 0.2 - 1.2 mg/dL   Alkaline Phosphatase 28 (L) 39 - 117 U/L   AST 20 0 - 37 U/L   ALT 15 0 - 35 U/L   Total Protein 7.9 6.0 - 8.3 g/dL   Albumin  4.3 3.5 - 5.2 g/dL   GFR 21.76 >39.99 mL/min  Calcium  9.4 8.4 - 10.5 mg/dL  Lipid panel   Collection Time: 11/16/23  9:25 AM  Result Value Ref Range   Cholesterol 199 0 - 200 mg/dL   Triglycerides 829.9 (H) 0.0 - 149.0 mg/dL   HDL 29.89 >60.99 mg/dL   VLDL 65.9 0.0 - 59.9 mg/dL   LDL Cholesterol 95 0 - 99 mg/dL   Total CHOL/HDL Ratio 3    NonHDL 129.05     Assessment & Plan:   Problem List Items Addressed This Visit     Health maintenance examination - Primary (Chronic)    Preventative protocols reviewed and updated unless pt declined. Discussed healthy diet and lifestyle.       Advanced care planning/counseling discussion (Chronic)   Previously discussed.       Osteoporosis   Relevant Orders   DG Bone Density   COPD (chronic obstructive pulmonary disease) (HCC)   Ongoing chronic cough. H/o COPD. Agrees to trial incruse ellipta sent to phramacy.       Relevant Medications   umeclidinium bromide (INCRUSE ELLIPTA) 62.5 MCG/ACT AEPB   Other Visit Diagnoses       Chest discomfort       Relevant Orders   EKG 12-Lead        Meds ordered this encounter  Medications   umeclidinium bromide (INCRUSE ELLIPTA) 62.5 MCG/ACT AEPB    Sig: Inhale 1 puff into the lungs daily.    Dispense:  30 each    Refill:  11    Orders Placed This Encounter  Procedures   DG Bone Density    Standing Status:   Future    Expiration Date:   11/22/2024    Reason for Exam (SYMPTOM  OR DIAGNOSIS REQUIRED):   osetoporosis f/u    Preferred imaging location?:   [facility]   EKG 12-Lead    Patient Instructions  EKG today  Work on advanced directive - bring us  copy when complete.  Try daily inhaler Incruse ellipta for COPD /chronic cough.  Call to to schedule bone density scan ideally on same day as mammogram: [facility] at [facility] [phone] Schedule eye exam as you're due.  Good to see you today Return as needed or in 6 months for follow up visit   Follow up plan: Return in about 6 months (around 05/22/2024) for follow up visit.  Anton Blas, MD

## 2023-11-23 NOTE — Assessment & Plan Note (Signed)
 Ongoing chronic cough. H/o COPD. Agrees to trial incruse ellipta sent to phramacy.

## 2023-11-23 NOTE — Assessment & Plan Note (Signed)
 Preventative protocols reviewed and updated unless pt declined. Discussed healthy diet and lifestyle.

## 2023-11-23 NOTE — Assessment & Plan Note (Signed)
 Previously discussed.

## 2023-11-23 NOTE — Patient Instructions (Addendum)
 EKG today  Work on advanced directive - bring us  copy when complete.  Try daily inhaler Incruse ellipta for COPD /chronic cough.  Call to to schedule bone density scan ideally on same day as mammogram: [facility] at [facility] [phone] Schedule eye exam as you're due.  Good to see you today Return as needed or in 6 months for follow up visit

## 2023-11-24 NOTE — Assessment & Plan Note (Addendum)
 Continues weekly fosamax  since 03/2023.  Continue calcium  vit D and regular weight bearing exercise.  Rec updated DEXA on fosamax  - ordered and # provided to schedule.

## 2023-11-24 NOTE — Assessment & Plan Note (Signed)
 Appreciate oncology care.

## 2023-11-24 NOTE — Assessment & Plan Note (Addendum)
 Ongoing intermittent discomfort, doesn't sound cardiac. Update EKG today - no acute changes.

## 2023-11-24 NOTE — Assessment & Plan Note (Addendum)
 Linzess  ineffective.  Saw GI, rec against colonoscopy

## 2023-11-24 NOTE — Assessment & Plan Note (Addendum)
 See above. Recently completed azithromycin  course, continues tessalon  perls. Trial incruse ellipta in h/o COPD.

## 2023-11-24 NOTE — Assessment & Plan Note (Signed)
 Thyroid function remains normal.

## 2023-11-24 NOTE — Assessment & Plan Note (Signed)
 Chronic, stable on lovastatin  - continue.  The 10-year ASCVD risk score (Arnett DK, et al., 2019) is: 19.9%   Values used to calculate the score:     Age: 78 years     Clincally relevant sex: Female     Is Non-Hispanic African American: No     Diabetic: No     Tobacco smoker: No     Systolic Blood Pressure: 112 mmHg     Is BP treated: No     HDL Cholesterol: 70.1 mg/dL     Total Cholesterol: 199 mg/dL

## 2023-11-24 NOTE — Assessment & Plan Note (Signed)
 She is on oral b12 1000mcg as well as B complex vitamin Levels now high - recommend stop b12 supplement, ok to continue b complex

## 2023-11-24 NOTE — Telephone Encounter (Signed)
 ERx

## 2023-12-04 ENCOUNTER — Encounter

## 2023-12-23 ENCOUNTER — Ambulatory Visit
Admission: RE | Admit: 2023-12-23 | Discharge: 2023-12-23 | Disposition: A | Source: Ambulatory Visit | Attending: Family Medicine | Admitting: Family Medicine

## 2023-12-23 DIAGNOSIS — Z1231 Encounter for screening mammogram for malignant neoplasm of breast: Secondary | ICD-10-CM | POA: Insufficient documentation

## 2023-12-23 DIAGNOSIS — M810 Age-related osteoporosis without current pathological fracture: Secondary | ICD-10-CM | POA: Diagnosis present

## 2023-12-26 ENCOUNTER — Other Ambulatory Visit: Payer: Self-pay | Admitting: Family Medicine

## 2023-12-28 ENCOUNTER — Ambulatory Visit: Payer: Self-pay | Admitting: Family Medicine

## 2023-12-28 DIAGNOSIS — M810 Age-related osteoporosis without current pathological fracture: Secondary | ICD-10-CM

## 2023-12-29 NOTE — Telephone Encounter (Signed)
 ERx

## 2023-12-31 ENCOUNTER — Ambulatory Visit: Payer: Self-pay | Admitting: Family Medicine

## 2024-01-20 ENCOUNTER — Other Ambulatory Visit: Payer: Self-pay | Admitting: Family Medicine

## 2024-01-21 ENCOUNTER — Other Ambulatory Visit: Payer: Self-pay | Admitting: Family Medicine

## 2024-01-22 NOTE — Telephone Encounter (Signed)
 Patient is up to date on bone density

## 2024-05-25 ENCOUNTER — Ambulatory Visit: Admitting: Family Medicine

## 2024-06-15 ENCOUNTER — Other Ambulatory Visit

## 2024-06-29 ENCOUNTER — Ambulatory Visit: Admitting: Oncology
